# Patient Record
Sex: Female | Born: 1965 | Race: Black or African American | Hispanic: No | Marital: Single | Smoking: Former smoker
Health system: Southern US, Community
[De-identification: ages and names within clinical notes are randomized; demographics above are authoritative.]

## PROBLEM LIST (undated history)

## (undated) DIAGNOSIS — M93001 Unspecified slipped upper femoral epiphysis (nontraumatic), right hip: Secondary | ICD-10-CM

## (undated) DIAGNOSIS — M25559 Pain in unspecified hip: Secondary | ICD-10-CM

## (undated) DIAGNOSIS — K810 Acute cholecystitis: Secondary | ICD-10-CM

## (undated) DIAGNOSIS — H269 Unspecified cataract: Secondary | ICD-10-CM

## (undated) DIAGNOSIS — D649 Anemia, unspecified: Secondary | ICD-10-CM

## (undated) DIAGNOSIS — Z9889 Other specified postprocedural states: Secondary | ICD-10-CM

## (undated) DIAGNOSIS — K5790 Diverticulosis of intestine, part unspecified, without perforation or abscess without bleeding: Secondary | ICD-10-CM

## (undated) DIAGNOSIS — T7840XA Allergy, unspecified, initial encounter: Secondary | ICD-10-CM

## (undated) DIAGNOSIS — R7303 Prediabetes: Secondary | ICD-10-CM

## (undated) DIAGNOSIS — M1711 Unilateral primary osteoarthritis, right knee: Secondary | ICD-10-CM

## (undated) DIAGNOSIS — E7800 Pure hypercholesterolemia, unspecified: Secondary | ICD-10-CM

## (undated) DIAGNOSIS — H9190 Unspecified hearing loss, unspecified ear: Secondary | ICD-10-CM

## (undated) DIAGNOSIS — Z01419 Encounter for gynecological examination (general) (routine) without abnormal findings: Secondary | ICD-10-CM

## (undated) DIAGNOSIS — R32 Unspecified urinary incontinence: Secondary | ICD-10-CM

## (undated) DIAGNOSIS — M2550 Pain in unspecified joint: Secondary | ICD-10-CM

## (undated) DIAGNOSIS — M069 Rheumatoid arthritis, unspecified: Secondary | ICD-10-CM

## (undated) DIAGNOSIS — E119 Type 2 diabetes mellitus without complications: Secondary | ICD-10-CM

## (undated) DIAGNOSIS — I10 Essential (primary) hypertension: Secondary | ICD-10-CM

## (undated) DIAGNOSIS — D219 Benign neoplasm of connective and other soft tissue, unspecified: Secondary | ICD-10-CM

## (undated) HISTORY — DX: Unilateral primary osteoarthritis, right knee: M17.11

## (undated) HISTORY — DX: Type 2 diabetes mellitus without complications: E11.9

## (undated) HISTORY — DX: Unspecified urinary incontinence: R32

## (undated) HISTORY — DX: Encounter for gynecological examination (general) (routine) without abnormal findings: Z01.419

## (undated) HISTORY — DX: Unspecified slipped upper femoral epiphysis (nontraumatic), right hip: M93.001

## (undated) HISTORY — DX: Benign neoplasm of connective and other soft tissue, unspecified: D21.9

## (undated) HISTORY — DX: Unspecified cataract: H26.9

## (undated) HISTORY — DX: Pure hypercholesterolemia, unspecified: E78.00

## (undated) HISTORY — DX: Allergy, unspecified, initial encounter: T78.40XA

## (undated) HISTORY — DX: Rheumatoid arthritis, unspecified: M06.9

## (undated) HISTORY — DX: Unspecified hearing loss, unspecified ear: H91.90

## (undated) HISTORY — DX: Pain in unspecified joint: M25.50

## (undated) HISTORY — DX: Diverticulosis of intestine, part unspecified, without perforation or abscess without bleeding: K57.90

---
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
## 2000-04-17 ENCOUNTER — Emergency Department (HOSPITAL_COMMUNITY): Admission: EM | Admit: 2000-04-17 | Discharge: 2000-04-17 | Payer: Self-pay | Admitting: Emergency Medicine

## 2001-06-20 ENCOUNTER — Emergency Department (HOSPITAL_COMMUNITY): Admission: EM | Admit: 2001-06-20 | Discharge: 2001-06-20 | Payer: Self-pay | Admitting: Emergency Medicine

## 2001-08-22 ENCOUNTER — Emergency Department (HOSPITAL_COMMUNITY): Admission: EM | Admit: 2001-08-22 | Discharge: 2001-08-22 | Payer: Self-pay | Admitting: *Deleted

## 2002-05-21 ENCOUNTER — Emergency Department (HOSPITAL_COMMUNITY): Admission: AD | Admit: 2002-05-21 | Discharge: 2002-05-22 | Payer: Self-pay | Admitting: Emergency Medicine

## 2002-05-21 ENCOUNTER — Encounter: Payer: Self-pay | Admitting: Emergency Medicine

## 2002-05-30 ENCOUNTER — Ambulatory Visit (HOSPITAL_COMMUNITY): Admission: RE | Admit: 2002-05-30 | Discharge: 2002-05-30 | Payer: Self-pay

## 2003-03-27 ENCOUNTER — Emergency Department (HOSPITAL_COMMUNITY): Admission: AD | Admit: 2003-03-27 | Discharge: 2003-03-27 | Payer: Self-pay | Admitting: Family Medicine

## 2003-10-20 ENCOUNTER — Emergency Department (HOSPITAL_COMMUNITY): Admission: EM | Admit: 2003-10-20 | Discharge: 2003-10-20 | Payer: Self-pay | Admitting: Emergency Medicine

## 2004-05-12 ENCOUNTER — Emergency Department (HOSPITAL_COMMUNITY): Admission: EM | Admit: 2004-05-12 | Discharge: 2004-05-12 | Payer: Self-pay

## 2004-06-05 ENCOUNTER — Emergency Department (HOSPITAL_COMMUNITY): Admission: EM | Admit: 2004-06-05 | Discharge: 2004-06-05 | Payer: Self-pay | Admitting: Emergency Medicine

## 2006-04-16 ENCOUNTER — Emergency Department (HOSPITAL_COMMUNITY): Admission: EM | Admit: 2006-04-16 | Discharge: 2006-04-16 | Payer: Self-pay | Admitting: Emergency Medicine

## 2006-06-14 ENCOUNTER — Other Ambulatory Visit: Admission: RE | Admit: 2006-06-14 | Discharge: 2006-06-14 | Payer: Self-pay | Admitting: Family Medicine

## 2006-07-13 ENCOUNTER — Emergency Department (HOSPITAL_COMMUNITY): Admission: EM | Admit: 2006-07-13 | Discharge: 2006-07-13 | Payer: Self-pay | Admitting: Emergency Medicine

## 2008-05-15 ENCOUNTER — Other Ambulatory Visit: Admission: RE | Admit: 2008-05-15 | Discharge: 2008-05-15 | Payer: Self-pay | Admitting: Family Medicine

## 2010-10-25 LAB — I-STAT 8, (EC8 V) (CONVERTED LAB)
Acid-base deficit: 1
BUN: 9
Bicarbonate: 25.3 — ABNORMAL HIGH
HCT: 43
Hemoglobin: 14.6
Operator id: 161631
Sodium: 137
TCO2: 27
pCO2, Ven: 45.7

## 2010-10-25 LAB — POCT CARDIAC MARKERS
CKMB, poc: 1.1
Myoglobin, poc: 60.5
Troponin i, poc: <0.05

## 2011-09-17 ENCOUNTER — Emergency Department (HOSPITAL_COMMUNITY): Payer: Self-pay

## 2011-09-17 ENCOUNTER — Encounter (HOSPITAL_COMMUNITY): Payer: Self-pay | Admitting: *Deleted

## 2011-09-17 ENCOUNTER — Emergency Department (HOSPITAL_COMMUNITY)
Admission: EM | Admit: 2011-09-17 | Discharge: 2011-09-17 | Disposition: A | Discharge status: Home / Self Care | Payer: Self-pay | Attending: Emergency Medicine | Admitting: Emergency Medicine

## 2011-09-17 DIAGNOSIS — D279 Benign neoplasm of unspecified ovary: Secondary | ICD-10-CM | POA: Insufficient documentation

## 2011-09-17 DIAGNOSIS — M25559 Pain in unspecified hip: Secondary | ICD-10-CM | POA: Insufficient documentation

## 2011-09-17 DIAGNOSIS — E7800 Pure hypercholesterolemia, unspecified: Secondary | ICD-10-CM | POA: Insufficient documentation

## 2011-09-17 DIAGNOSIS — R109 Unspecified abdominal pain: Secondary | ICD-10-CM | POA: Insufficient documentation

## 2011-09-17 DIAGNOSIS — I10 Essential (primary) hypertension: Secondary | ICD-10-CM | POA: Insufficient documentation

## 2011-09-17 DIAGNOSIS — F17200 Nicotine dependence, unspecified, uncomplicated: Secondary | ICD-10-CM | POA: Insufficient documentation

## 2011-09-17 HISTORY — DX: Pain in unspecified hip: M25.559

## 2011-09-17 HISTORY — DX: Essential (primary) hypertension: I10

## 2011-09-17 LAB — URINE MICROSCOPIC-ADD ON

## 2011-09-17 LAB — CBC WITH DIFFERENTIAL/PLATELET
Eosinophils Relative: 0 % (ref 0–5)
HCT: 36.3 % (ref 36.0–46.0)
Hemoglobin: 11.9 g/dL — ABNORMAL LOW (ref 12.0–15.0)
Lymphocytes Relative: 26 % (ref 12–46)
MCHC: 32.8 g/dL (ref 30.0–36.0)
MCV: 81.8 fL (ref 78.0–100.0)
Monocytes Absolute: 0.5 10*3/uL (ref 0.1–1.0)
Monocytes Relative: 5 % (ref 3–12)
Neutro Abs: 8.4 10*3/uL — ABNORMAL HIGH (ref 1.7–7.7)

## 2011-09-17 LAB — HEPATIC FUNCTION PANEL
AST: 12 U/L (ref 0–37)
Albumin: 3.5 g/dL (ref 3.5–5.2)
Total Protein: 7.2 g/dL (ref 6.0–8.3)

## 2011-09-17 LAB — POCT I-STAT, CHEM 8
BUN: 9 mg/dL (ref 6–23)
Creatinine, Ser: 0.8 mg/dL (ref 0.50–1.10)
Glucose, Bld: 137 mg/dL — ABNORMAL HIGH (ref 70–99)
Potassium: 3.4 mEq/L — ABNORMAL LOW (ref 3.5–5.1)
Sodium: 143 mEq/L (ref 135–145)

## 2011-09-17 LAB — URINALYSIS, ROUTINE W REFLEX MICROSCOPIC
Glucose, UA: NEGATIVE mg/dL
Hgb urine dipstick: NEGATIVE
Ketones, ur: NEGATIVE mg/dL
Leukocytes, UA: NEGATIVE
Protein, ur: 30 mg/dL — AB
pH: 7 (ref 5.0–8.0)

## 2011-09-17 MED ORDER — IOHEXOL 300 MG/ML  SOLN
20.0000 mL | INTRAMUSCULAR | Status: AC
  Administered 2011-09-17: 20 mL via ORAL

## 2011-09-17 MED ORDER — ONDANSETRON HCL 4 MG PO TABS: 4.0000 mg | ORAL_TABLET | Freq: Four times a day (QID) | ORAL | Status: AC

## 2011-09-17 MED ORDER — FENTANYL CITRATE 0.05 MG/ML IJ SOLN
100.0000 ug | Freq: Once | INTRAMUSCULAR | Status: AC
  Administered 2011-09-17: 100 ug via INTRAVENOUS
  Filled 2011-09-17: qty 2

## 2011-09-17 MED ORDER — HYDROCODONE-ACETAMINOPHEN 5-325 MG PO TABS: 1.0000 | ORAL_TABLET | ORAL | Status: AC | PRN

## 2011-09-17 MED ORDER — IOHEXOL 300 MG/ML  SOLN
100.0000 mL | Freq: Once | INTRAMUSCULAR | Status: AC | PRN
  Administered 2011-09-17: 100 mL via INTRAVENOUS

## 2011-09-17 MED ORDER — HYDROMORPHONE HCL PF 1 MG/ML IJ SOLN
1.0000 mg | Freq: Once | INTRAMUSCULAR | Status: AC
  Administered 2011-09-17: 1 mg via INTRAVENOUS
  Filled 2011-09-17: qty 1

## 2011-09-17 NOTE — ED Notes (Signed)
Patient returned from xray, alert speech is a little slurred from pain medication. States she is feeling better , family at bedside.

## 2011-09-17 NOTE — ED Notes (Addendum)
Pt states that her generalized  abdominal pain across her entire abdomen and pelvis that also radiates to her right side woke her up from sleep 2 hours ago. Pt tearful and moaning in triage unable to sit still.

## 2011-09-17 NOTE — ED Provider Notes (Signed)
46yo G0P0 several hours right lower quadrant localized pain with adnexal tenderness and mass, he now improving but not resolved, pain was gradual onset noncolicky, pain worse with position, she is out of moderately tender right lower quadrant without rebound,GYn paged after [place] resulted.  Medical screening examination/treatment/procedure(s) were conducted as a shared visit with non-physician practitioner(s) and myself.  I personally evaluated the patient during the encounter  D/w Barnabas Lister for f/u 1040  Hurman Horn, MD 09/21/11 1704

## 2011-09-17 NOTE — ED Provider Notes (Signed)
History     CSN: [id number]  Arrival date & time 09/17/11  5784   First MD Initiated Contact with Patient 09/17/11 0344      Chief Complaint  Patient presents with  . Flank Pain    right side  . Abdominal Pain    (Consider location/radiation/quality/duration/timing/severity/associated sxs/prior treatment) Patient is a 46 y.o. female presenting with flank pain and abdominal pain. The history is provided by the patient.  Flank Pain This is a new problem. The current episode started 3 to 5 hours ago. The problem occurs constantly. The problem has not changed since onset.Associated symptoms include abdominal pain. Pertinent negatives include no chest pain, no headaches and no shortness of breath. Nothing aggravates the symptoms. Nothing relieves the symptoms. She has tried nothing for the symptoms. The treatment provided no relief.  Abdominal Pain The primary symptoms of the illness include abdominal pain. The primary symptoms of the illness do not include fever, fatigue, shortness of breath, nausea, vomiting, diarrhea, hematemesis, hematochezia, dysuria, vaginal discharge or vaginal bleeding. The current episode started 1 to 2 hours ago. The onset of the illness was sudden. The problem has not changed since onset. The abdominal pain began 1 to 2 hours ago. The pain came on suddenly. The abdominal pain has been unchanged since its onset. The abdominal pain is generalized. The abdominal pain does not radiate. The severity of the abdominal pain is 10/10. The abdominal pain is relieved by nothing. Exacerbated by: nothing.  The patient states that she believes she is currently not pregnant. The patient has not had a change in bowel habit. Symptoms associated with the illness do not include chills, anorexia, diaphoresis, heartburn, constipation, urgency, hematuria or frequency. Significant associated medical issues do not include GERD.    Past Medical History  Diagnosis Date  . Hypertension   .  High cholesterol   . Hip pain     pt states no cartilage in right hip very painful    History reviewed. No pertinent past surgical history.  History reviewed. No pertinent family history.  History  Substance Use Topics  . Smoking status: Current Everyday Smoker  . Smokeless tobacco: Not on file  . Alcohol Use: Yes    OB History    Grav Para Term Preterm Abortions TAB SAB Ect Mult Living                  Review of Systems  Constitutional: Negative for fever, chills, diaphoresis and fatigue.  Respiratory: Negative for shortness of breath.   Cardiovascular: Negative for chest pain.  Gastrointestinal: Positive for abdominal pain. Negative for heartburn, nausea, vomiting, diarrhea, constipation, hematochezia, anorexia and hematemesis.  Genitourinary: Positive for flank pain. Negative for dysuria, urgency, frequency, hematuria, vaginal bleeding and vaginal discharge.  Neurological: Negative for headaches.  All other systems reviewed and are negative.    Allergies  Review of patient's allergies indicates no known allergies.  Home Medications   Current Outpatient Rx  Name Route Sig Dispense Refill  . OXYCODONE-ACETAMINOPHEN 5-325 MG PO TABS Oral Take 1 tablet by mouth 2 (two) times daily as needed. For pain      BP 116/81  Pulse 54  Temp 97.9 F (36.6 C) (Oral)  Resp 20  SpO2 100%  Physical Exam  Constitutional: She is oriented to person, place, and time. She appears well-developed and well-nourished.  HENT:  Head: Normocephalic and atraumatic.  Mouth/Throat: Oropharynx is clear and moist.  Eyes: Conjunctivae are normal. Pupils are equal, round, and reactive  to light.  Neck: Normal range of motion. Neck supple.  Cardiovascular: Normal rate and regular rhythm.   Pulmonary/Chest: Effort normal and breath sounds normal. She has no wheezes. She has no rales.  Abdominal: Soft. Bowel sounds are normal. There is no rebound and no guarding.    Musculoskeletal: Normal  range of motion.       Arms: Neurological: She is alert and oriented to person, place, and time.  Skin: Skin is warm and dry.    ED Course  Procedures (including critical care time)  Labs Reviewed  URINALYSIS, ROUTINE W REFLEX MICROSCOPIC - Abnormal; Notable for the following:    APPearance CLOUDY (*)     Bilirubin Urine LARGE (*)     Protein, ur 30 (*)     All other components within normal limits  CBC WITH DIFFERENTIAL - Abnormal; Notable for the following:    WBC 12.1 (*)     Hemoglobin 11.9 (*)     Neutro Abs 8.4 (*)     All other components within normal limits  POCT I-STAT, CHEM 8 - Abnormal; Notable for the following:    Potassium 3.4 (*)     Glucose, Bld 137 (*)     Calcium, Ion 1.11 (*)     All other components within normal limits  URINE MICROSCOPIC-ADD ON - Abnormal; Notable for the following:    Squamous Epithelial / LPF MANY (*)     All other components within normal limits  HEPATIC FUNCTION PANEL - Abnormal; Notable for the following:    Total Bilirubin 0.2 (*)     All other components within normal limits  POCT PREGNANCY, URINE  LIPASE, BLOOD   No results found.   No diagnosis found.    MDM  Will sign out to Dr. Fonnie Jarvis and catherine williams, ordered stat pelvic US for r/o torsion        Linnae Rasool K Kijana Estock-Rasch, MD 09/17/11 [phone]

## 2011-09-17 NOTE — ED Provider Notes (Signed)
Care of pt assumed in CDU from Dr. Nicanor Alcon. Patient presented to the emergency department with complaint of abdominal and flank pain which started just prior to arrival last evening. She had no other associated symptoms with this. Pain was described as constant in nature. Patient was initially very uncomfortable on arrival to the emergency department but has had improvement of her symptoms with pain medication. CT of the abdomen and pelvis shows a possible dermoid cyst. The ovary at the area of the cyst is not well visualized. Based on this, the patient was moved to CDU pending a US of the pelvis with flow studies to rule out ovarian torsion. The ultrasound shows color flow at the area of the dermoid cyst although there's not central color flow obviously seen. Discussed findings with Dr. Fonnie Jarvis who examined the patient with me. He discussed the findings with Dr. Su Hilt, the GYN on-call, who states the patient can plan to call the office in the morning for followup within the next few days. Will prescribe pain medicine. Reasons to return for worsening, changing, or new symptoms discussed. Patient verbalized understanding and agreed to this plan.   Grant Fontana, PA-C 09/17/11 1137

## 2011-09-21 ENCOUNTER — Encounter: Payer: Self-pay | Admitting: Obstetrics and Gynecology

## 2011-09-21 ENCOUNTER — Ambulatory Visit (INDEPENDENT_AMBULATORY_CARE_PROVIDER_SITE_OTHER): Payer: 59 | Admitting: Obstetrics and Gynecology

## 2011-09-21 VITALS — BP 130/82 | Temp 98.4°F | Ht 64.0 in | Wt 250.0 lb

## 2011-09-21 DIAGNOSIS — E7800 Pure hypercholesterolemia, unspecified: Secondary | ICD-10-CM | POA: Insufficient documentation

## 2011-09-21 DIAGNOSIS — Z124 Encounter for screening for malignant neoplasm of cervix: Secondary | ICD-10-CM

## 2011-09-21 DIAGNOSIS — N838 Other noninflammatory disorders of ovary, fallopian tube and broad ligament: Secondary | ICD-10-CM

## 2011-09-21 DIAGNOSIS — N839 Noninflammatory disorder of ovary, fallopian tube and broad ligament, unspecified: Secondary | ICD-10-CM

## 2011-09-21 DIAGNOSIS — Z1231 Encounter for screening mammogram for malignant neoplasm of breast: Secondary | ICD-10-CM

## 2011-09-21 DIAGNOSIS — M069 Rheumatoid arthritis, unspecified: Secondary | ICD-10-CM | POA: Insufficient documentation

## 2011-09-21 DIAGNOSIS — Z139 Encounter for screening, unspecified: Secondary | ICD-10-CM

## 2011-09-21 DIAGNOSIS — R350 Frequency of micturition: Secondary | ICD-10-CM

## 2011-09-21 DIAGNOSIS — G8929 Other chronic pain: Secondary | ICD-10-CM | POA: Insufficient documentation

## 2011-09-21 DIAGNOSIS — N920 Excessive and frequent menstruation with regular cycle: Secondary | ICD-10-CM

## 2011-09-21 LAB — CBC
HCT: 36.7 % (ref 36.0–46.0)
Hemoglobin: 11.8 g/dL — ABNORMAL LOW (ref 12.0–15.0)
MCV: 83.6 fL (ref 78.0–100.0)
Platelets: 403 10*3/uL — ABNORMAL HIGH (ref 150–400)
RBC: 4.39 MIL/uL (ref 3.87–5.11)
WBC: 6.3 10*3/uL (ref 4.0–10.5)

## 2011-09-21 LAB — PROLACTIN: Prolactin: 13.1 ng/mL

## 2011-09-21 NOTE — Progress Notes (Signed)
Here to f/u visit to ER secondary to pelvic/abd pain and found to have 14cm ovarian mass c/w dermoid. She also reports very heavy cycles the first two days but skipped menses last month with only spotting Denies h/o surgery No children Hip pain R>L secondary to rheumatoid arthritis.  Needs surgery on that hip she says.  Filed Vitals:   09/21/11 1127  BP: 130/82  Temp: 98.4 F (36.9 C)   ROS: noncontributory  Abdomen:soft, RLQ discomfort, no rebound or guarding Pelvic exam:  VULVA: normal appearing vulva with no masses, tenderness or lesions,  VAGINA: normal appearing vagina with normal color and discharge, no lesions, CERVIX: normal appearing cervix without discharge or lesions,  UTERUS: uterus is normal size, shape, consistency and nontender, difficult secondary to habitus ADNEXA: normal adnexa in size, nontender and no masses.  Pelvic U/S 9/8 - Ut 8x3.9x3.1cm Fundal fibroid 4.4cm, large cystic and solid right adnexal mass 14.3x9.1x14.8cm with echogenic nodule likely dermoid plug, no left adnexal masses, there is flow to the right ovary (this was compared by radiology with CT Scan of same day)  A/P Pap Labs - tsh, prl, cbc, vit d, ca-125 Next available for EmBx Discussed mgmt options and recs.  Pt wants surgical mgmt but will complete w/u for menorrhagia and discuss surgical options/recs. mammo

## 2011-09-22 LAB — PAP IG W/ RFLX HPV ASCU

## 2011-09-23 LAB — URINE CULTURE: Organism ID, Bacteria: NO GROWTH

## 2011-09-27 ENCOUNTER — Telehealth: Payer: Self-pay

## 2011-09-27 NOTE — Telephone Encounter (Signed)
Left message for pt to return call regarding Vit-d protocol. Alyssa Rangel

## 2011-09-29 ENCOUNTER — Telehealth: Payer: Self-pay

## 2011-09-29 DIAGNOSIS — E559 Vitamin D deficiency, unspecified: Secondary | ICD-10-CM

## 2011-09-29 NOTE — Telephone Encounter (Signed)
Pt returned call regarding Vit-d protocol. RX was called in for Vit-d softgels 50,000 units 1 cap 2 x wk for 8 wks # 28 0 rf. Future lab ordered. Mathis Bud

## 2011-10-09 ENCOUNTER — Encounter: Payer: 59 | Admitting: Obstetrics and Gynecology

## 2011-10-09 ENCOUNTER — Encounter: Payer: Self-pay | Admitting: Obstetrics and Gynecology

## 2011-10-17 ENCOUNTER — Encounter: Payer: Self-pay | Admitting: Obstetrics and Gynecology

## 2011-10-17 ENCOUNTER — Ambulatory Visit (INDEPENDENT_AMBULATORY_CARE_PROVIDER_SITE_OTHER): Payer: 59 | Admitting: Obstetrics and Gynecology

## 2011-10-17 VITALS — BP 126/58 | Resp 16 | Ht 64.0 in | Wt 239.0 lb

## 2011-10-17 DIAGNOSIS — N898 Other specified noninflammatory disorders of vagina: Secondary | ICD-10-CM

## 2011-10-17 DIAGNOSIS — N920 Excessive and frequent menstruation with regular cycle: Secondary | ICD-10-CM

## 2011-10-17 DIAGNOSIS — R110 Nausea: Secondary | ICD-10-CM

## 2011-10-17 DIAGNOSIS — N949 Unspecified condition associated with female genital organs and menstrual cycle: Secondary | ICD-10-CM

## 2011-10-17 LAB — POCT WET PREP (WET MOUNT)
Clue Cells Wet Prep Whiff POC: POSITIVE
Trichomonas Wet Prep HPF POC: NEGATIVE
pH: 5

## 2011-10-17 LAB — POCT URINE PREGNANCY: Preg Test, Ur: NEGATIVE

## 2011-10-17 MED ORDER — PROMETHAZINE HCL 25 MG/ML IJ SOLN
25.0000 mg | Freq: Four times a day (QID) | INTRAMUSCULAR | Status: AC | PRN
  Administered 2011-10-17: 25 mg via INTRAMUSCULAR

## 2011-10-17 MED ORDER — TINIDAZOLE 500 MG PO TABS: 2.0000 g | ORAL_TABLET | Freq: Every day | ORAL | Status: DC

## 2011-10-17 NOTE — Progress Notes (Signed)
Here for EmBx  Filed Vitals:   10/17/11 1155  BP: 126/58  Resp: 16   ROS: noncontributory  Pelvic exam:  VULVA: normal appearing vulva with no masses, tenderness or lesions,  VAGINA: normal appearing vagina with normal color and discharge, no lesions, CERVIX: normal appearing cervix without discharge or lesions,   EmBx performed per protocol Pt very uncomfortable with first pass so no more passes attempted Pipelle passed to 12-14cm  Results for orders placed in visit on 10/17/11  POCT URINE PREGNANCY      Component Value Range   Preg Test, Ur Negative    POCT WET PREP (WET MOUNT)      Component Value Range   Source Wet Prep POC neg     WBC, Wet Prep HPF POC neg     Bacteria Wet Prep HPF POC mod     BACTERIA WET PREP MORPHOLOGY POC       Clue Cells Wet Prep HPF POC Moderate     CLUE CELLS WET PREP WHIFF POC Positive Whiff     Yeast Wet Prep HPF POC None     KOH Wet Prep POC       Trichomonas Wet Prep HPF POC neg     pH 5.0     Labs reviewed vit d low and mildly anemic, tsh and prl wnl and ca-125 wnl  A/P Phenergan now secondary to nausea Rx for tindamax sent to pharm secondary to BV RTO 1-2wks to discuss surgery  Lap to remove cyst/?ovary and ablation vs hysterectomy for bleeding

## 2011-10-17 NOTE — Addendum Note (Signed)
Addended by: Marla Roe A on: 10/17/2011 01:20 PM   Modules accepted: Orders

## 2011-10-19 LAB — PATHOLOGY

## 2011-10-25 ENCOUNTER — Ambulatory Visit: Payer: Self-pay

## 2011-10-30 ENCOUNTER — Encounter: Payer: Self-pay | Admitting: Obstetrics and Gynecology

## 2011-10-30 ENCOUNTER — Ambulatory Visit (INDEPENDENT_AMBULATORY_CARE_PROVIDER_SITE_OTHER): Payer: 59 | Admitting: Obstetrics and Gynecology

## 2011-10-30 VITALS — BP 134/64 | Ht 64.0 in | Wt 240.0 lb

## 2011-10-30 DIAGNOSIS — N839 Noninflammatory disorder of ovary, fallopian tube and broad ligament, unspecified: Secondary | ICD-10-CM

## 2011-10-30 DIAGNOSIS — N920 Excessive and frequent menstruation with regular cycle: Secondary | ICD-10-CM | POA: Insufficient documentation

## 2011-10-30 DIAGNOSIS — N838 Other noninflammatory disorders of ovary, fallopian tube and broad ligament: Secondary | ICD-10-CM

## 2011-10-30 NOTE — Progress Notes (Signed)
Here to f/u em bx  Office Visit on 10/17/2011  Component Date Value Range Status  . Report 10/17/2011    Final   Comment: FINAL DIAGNOSIS:                          Endometrial biopsy:                            - Benign endometrium                           COMMENT:                          The specimen is scant and may in fact represent lower endometrial canal sampling.  The                          adequacy of the specimen will depend upon correlation with the thickness of the                          endometrium as assessed by ultrasound.                            Army Melia, M.D.                          Electronically Signed                          CLINICAL HISTORY:                          626.2                          SOURCE OF SPECIMEN:                          A: Endometrium - Biopsy                          GROSS DESCRIPTION:                          Received in a single container of formalin labeled with the patient's name and "EMBX" is                          cloudy mucoid material admixed with small fragments of pale tan soft tissue measuring 1.0                          x 0.8 x 0.3 cm in aggregate dimensions. The specimen is filtered and entirely submitted in                          cassette A.  CA/srt                           MICROSCOPIC DESCRIPTION:  The histologic section shows a strip of inactive endometrium.  REB/adw                          Signout Location: [facility] [place], [place], [place] (CPT: [phone])  . Preg Test, Ur 10/17/2011 Negative   Final  . Source Wet Prep POC 10/17/2011 neg   Final  . WBC, Wet Prep HPF POC 10/17/2011 neg   Final  . Bacteria Wet Prep HPF POC 10/17/2011 mod   Final  . Clue Cells Wet Prep HPF POC 10/17/2011 Moderate   Final  . CLUE CELLS WET PREP WHIFF POC 10/17/2011 Positive Whiff   Final  . Yeast Wet Prep HPF POC 10/17/2011 None   Final  . Trichomonas Wet Prep HPF POC 10/17/2011 neg   Final    . pH 10/17/2011 5.0   Final   Filed Vitals:   10/30/11 1513  BP: 134/64   A/P Reviewed em bx Options and recs discussed Pt agreeable to proceed with surgical mgmt Will schedule laparoscopic rt cystectomy, poss laparotomy, poss oophorectomy, hysteroscopy, d&C and ablation

## 2011-11-21 ENCOUNTER — Telehealth: Payer: Self-pay | Admitting: Obstetrics and Gynecology

## 2011-11-21 NOTE — Telephone Encounter (Signed)
Laparoscopic Cystectomy; Possible Laparotomy, possible Oopherectomy; Hysteroscopy D&C; Ablation scheduled for 12/29/11 @ 10:00 with AR/ND. [employer] effective 10/10/11.  Plan pays 75/25 after a $1,500 deductible. -Alyssa Rangel

## 2011-11-22 ENCOUNTER — Other Ambulatory Visit: Payer: Self-pay | Admitting: Obstetrics and Gynecology

## 2011-11-23 ENCOUNTER — Telehealth: Payer: Self-pay | Admitting: Obstetrics and Gynecology

## 2011-11-28 ENCOUNTER — Emergency Department (HOSPITAL_COMMUNITY)
Admission: EM | Admit: 2011-11-28 | Discharge: 2011-11-28 | Disposition: A | Discharge status: Home / Self Care | Payer: Worker's Compensation | Attending: Emergency Medicine | Admitting: Emergency Medicine

## 2011-11-28 ENCOUNTER — Emergency Department (HOSPITAL_COMMUNITY): Payer: Worker's Compensation

## 2011-11-28 DIAGNOSIS — S7000XA Contusion of unspecified hip, initial encounter: Secondary | ICD-10-CM

## 2011-11-28 DIAGNOSIS — F17200 Nicotine dependence, unspecified, uncomplicated: Secondary | ICD-10-CM | POA: Insufficient documentation

## 2011-11-28 DIAGNOSIS — Y929 Unspecified place or not applicable: Secondary | ICD-10-CM | POA: Insufficient documentation

## 2011-11-28 DIAGNOSIS — S40019A Contusion of unspecified shoulder, initial encounter: Secondary | ICD-10-CM

## 2011-11-28 DIAGNOSIS — Z79899 Other long term (current) drug therapy: Secondary | ICD-10-CM | POA: Insufficient documentation

## 2011-11-28 DIAGNOSIS — M1610 Unilateral primary osteoarthritis, unspecified hip: Secondary | ICD-10-CM | POA: Insufficient documentation

## 2011-11-28 DIAGNOSIS — Y9389 Activity, other specified: Secondary | ICD-10-CM | POA: Insufficient documentation

## 2011-11-28 DIAGNOSIS — I10 Essential (primary) hypertension: Secondary | ICD-10-CM | POA: Insufficient documentation

## 2011-11-28 DIAGNOSIS — IMO0002 Reserved for concepts with insufficient information to code with codable children: Secondary | ICD-10-CM | POA: Insufficient documentation

## 2011-11-28 DIAGNOSIS — M069 Rheumatoid arthritis, unspecified: Secondary | ICD-10-CM | POA: Insufficient documentation

## 2011-11-28 DIAGNOSIS — E7800 Pure hypercholesterolemia, unspecified: Secondary | ICD-10-CM | POA: Insufficient documentation

## 2011-11-28 DIAGNOSIS — M129 Arthropathy, unspecified: Secondary | ICD-10-CM | POA: Insufficient documentation

## 2011-11-28 DIAGNOSIS — M169 Osteoarthritis of hip, unspecified: Secondary | ICD-10-CM | POA: Insufficient documentation

## 2011-11-28 DIAGNOSIS — IMO0001 Reserved for inherently not codable concepts without codable children: Secondary | ICD-10-CM | POA: Insufficient documentation

## 2011-11-28 LAB — RAPID URINE DRUG SCREEN, HOSP PERFORMED
Amphetamines: NOT DETECTED
Benzodiazepines: NOT DETECTED
Cocaine: POSITIVE — AB
Opiates: NOT DETECTED

## 2011-11-28 MED ORDER — IBUPROFEN 200 MG PO TABS
600.0000 mg | ORAL_TABLET | Freq: Once | ORAL | Status: AC
  Administered 2011-11-28: 600 mg via ORAL
  Filled 2011-11-28: qty 1

## 2011-11-28 MED ORDER — NAPROXEN 375 MG PO TABS: 375.0000 mg | ORAL_TABLET | Freq: Two times a day (BID) | ORAL | Status: DC

## 2011-11-28 MED ORDER — OXYCODONE-ACETAMINOPHEN 5-325 MG PO TABS
1.0000 | ORAL_TABLET | Freq: Once | ORAL | Status: AC
  Administered 2011-11-28: 1 via ORAL
  Filled 2011-11-28: qty 1

## 2011-11-28 MED ORDER — OXYCODONE-ACETAMINOPHEN 5-325 MG PO TABS: 1.0000 | ORAL_TABLET | ORAL | Status: DC | PRN

## 2011-11-28 NOTE — ED Notes (Signed)
ZOX:[id number]<AV> Expected date:<BR> Expected time:<BR> Means of arrival:<BR> Comments:<BR> LSB-fall

## 2011-11-28 NOTE — ED Notes (Signed)
Pt fell going into work today.  Lost her balance and fell on left side.  Also hit right side of head, c/o right shoulder pain and right hip pain.  Pt has outward rotation and shortening of right lower extremity.  Pt was given 150mg  Fentanyl by EMS in route.  Pt denies LOC and is alert and oriented although a bit sleepy now due to pain medication.

## 2011-11-28 NOTE — ED Provider Notes (Signed)
History     CSN: [id number]  Arrival date & time 11/28/11  1547   First MD Initiated Contact with Patient 11/28/11 1625      Chief Complaint  Patient presents with  . Fall    (Consider location/radiation/quality/duration/timing/severity/associated sxs/prior treatment) HPI Pt states she mis-stepped on a curb lost her balance and fell back ward onto R sided. No head or neck trauma. No LOC. Pt c/o of muscular pain to shoulder and R thigh. Pt has chronic R hip pain and states pain is mildly worse than baseline. C-collar places by EMS. No numbness, or weakness.  Past Medical History  Diagnosis Date  . Hypertension   . High cholesterol   . Hip pain     pt states no cartilage in right hip very painful  . Hypercholesterolemia   . Rheumatoid arthritis     has caused deterioration of cartiledge in R hip.    No past surgical history on file.  Family History  Problem Relation Age of Onset  . Heart disease Maternal Grandmother   . Diabetes Maternal Grandmother   . Hypertension Maternal Grandmother   . Diabetes Mother   . Hypertension Mother     History  Substance Use Topics  . Smoking status: Current Every Day Smoker  . Smokeless tobacco: Not on file  . Alcohol Use: Yes    OB History    Grav Para Term Preterm Abortions TAB SAB Ect Mult Living                  Review of Systems  Constitutional: Negative for fever, chills and fatigue.  HENT: Negative for neck pain and neck stiffness.   Eyes: Negative for visual disturbance.  Respiratory: Negative for shortness of breath.   Cardiovascular: Negative for chest pain, palpitations and leg swelling.  Gastrointestinal: Negative for nausea, vomiting and abdominal pain.  Genitourinary: Negative for dysuria.  Musculoskeletal: Positive for myalgias and arthralgias. Negative for back pain.  Skin: Negative for rash and wound.  Neurological: Negative for dizziness, seizures, syncope, weakness, light-headedness, numbness and  headaches.    Allergies  Review of patient's allergies indicates no known allergies.  Home Medications   Current Outpatient Rx  Name  Route  Sig  Dispense  Refill  . FERROUS SULFATE 325 (65 FE) MG PO TABS   Oral   Take 325 mg by mouth 2 (two) times a week. Tuesdays and Thursdays.         Marland Kitchen NAPROXEN 375 MG PO TABS   Oral   Take 1 tablet (375 mg total) by mouth 2 (two) times daily.   20 tablet   0   . OXYCODONE-ACETAMINOPHEN 5-325 MG PO TABS   Oral   Take 1 tablet by mouth every 4 (four) hours as needed. For pain   15 tablet   0     BP 140/67  Pulse 57  Temp 98.1 F (36.7 C) (Oral)  Resp 18  SpO2 98%  LMP 11/23/2011  Physical Exam  Nursing note and vitals reviewed. Constitutional: She is oriented to person, place, and time. She appears well-developed and well-nourished. No distress.  HENT:  Head: Normocephalic and atraumatic.  Mouth/Throat: Oropharynx is clear and moist.  Eyes: EOM are normal. Pupils are equal, round, and reactive to light.  Neck: Normal range of motion. Neck supple.       No posterior cervical midline TTP. + tenderness to R trapezius.   Cardiovascular: Normal rate and regular rhythm.  Exam reveals no gallop  and no friction rub.   No murmur heard. Pulmonary/Chest: Effort normal and breath sounds normal. No respiratory distress. She has no wheezes. She has no rales.  Abdominal: Soft. Bowel sounds are normal. She exhibits no mass. There is no tenderness. There is no rebound and no guarding.  Musculoskeletal: Normal range of motion. She exhibits tenderness (TTP over R lateral thigh. No deformity or injury. FROM of R hip. 2+ DP. No shortening or rotation of RLE. ). She exhibits no edema.  Neurological: She is alert and oriented to person, place, and time.       5/5 motor in all ext. Sensation intact  Skin: Skin is warm and dry. No rash noted. No erythema.  Psychiatric: She has a normal mood and affect. Her behavior is normal.    ED Course    Procedures (including critical care time)  Labs Reviewed  URINE RAPID DRUG SCREEN (HOSP PERFORMED) - Abnormal; Notable for the following:    Cocaine POSITIVE (*)     All other components within normal limits   Dg Hip Complete Right  11/28/2011  *RADIOLOGY REPORT*  Clinical Data: Status post fall.  Right hip pain.  RIGHT HIP - COMPLETE 2+ VIEW  Comparison: Plain films 04/16/2006.  Findings: The patient has a remote right slipped capital femoral epiphyses.  The right hip is located.  There has been progression of severe right hip osteoarthritis.  No fracture is identified.  IMPRESSION:  1.  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 2.  Progression of severe right hip osteoarthritis.   Original Report Authenticated By: Holley Dexter, M.D.      1. Shoulder contusion   2. Contusion, hip   3. Osteoarthritis of hip       MDM          Loren Racer, MD 11/28/11 2315

## 2011-11-29 ENCOUNTER — Telehealth (HOSPITAL_COMMUNITY): Payer: Self-pay | Admitting: *Deleted

## 2011-12-14 ENCOUNTER — Telehealth: Payer: Self-pay | Admitting: Obstetrics and Gynecology

## 2011-12-14 NOTE — Telephone Encounter (Signed)
Laparoscopic Cystectomy; Possible Laparotomy, possible Oopherectomy; Hysteroscopy D&C; Ablation rescheduled to 02/06/12 @ 9:30 with AR/ND. [employer] effective 10/10/11.  Plan pays 75/25 after a $1,500 deductible. -Adrianne Pridgen

## 2011-12-22 ENCOUNTER — Encounter: Payer: 59 | Admitting: Obstetrics and Gynecology

## 2011-12-28 ENCOUNTER — Telehealth: Payer: Self-pay | Admitting: Obstetrics and Gynecology

## 2012-01-01 ENCOUNTER — Other Ambulatory Visit: Payer: Self-pay | Admitting: Obstetrics and Gynecology

## 2012-01-15 ENCOUNTER — Encounter: Payer: 59 | Admitting: Obstetrics and Gynecology

## 2012-01-25 ENCOUNTER — Telehealth: Payer: Self-pay | Admitting: Obstetrics and Gynecology

## 2012-01-25 NOTE — Telephone Encounter (Signed)
TC to [facility] for benefits, Precert is not required for 29562 and [id number]. -Adrianne Pridgen

## 2012-01-25 NOTE — Telephone Encounter (Signed)
Laparoscopic Cystectomy; Possible Laparotomy possible Oopherectomy; Hysteroscopy D&C and Ablation scheduled for 02/06/12 @ 9:30 with AR/ND.  [employer] effective 10/10/11. Plan pays 75/25 after a $1,500 deductible. Pre-cert not Required. -Adrianne Pridgen

## 2012-01-26 ENCOUNTER — Encounter: Payer: Self-pay | Admitting: Obstetrics and Gynecology

## 2012-01-26 ENCOUNTER — Ambulatory Visit: Payer: 59 | Admitting: Obstetrics and Gynecology

## 2012-01-26 VITALS — BP 128/62 | HR 70 | Temp 97.9°F | Resp 16 | Ht 64.0 in | Wt 233.0 lb

## 2012-01-26 DIAGNOSIS — D279 Benign neoplasm of unspecified ovary: Secondary | ICD-10-CM

## 2012-01-26 DIAGNOSIS — R110 Nausea: Secondary | ICD-10-CM

## 2012-01-26 DIAGNOSIS — Z01818 Encounter for other preprocedural examination: Secondary | ICD-10-CM

## 2012-01-26 DIAGNOSIS — R102 Pelvic and perineal pain: Secondary | ICD-10-CM

## 2012-01-26 DIAGNOSIS — N920 Excessive and frequent menstruation with regular cycle: Secondary | ICD-10-CM

## 2012-01-26 MED ORDER — ONDANSETRON HCL 4 MG PO TABS: 4.0000 mg | ORAL_TABLET | Freq: Three times a day (TID) | ORAL | Status: DC | PRN

## 2012-01-26 MED ORDER — OXYCODONE-ACETAMINOPHEN 5-325 MG PO TABS: 1.0000 | ORAL_TABLET | ORAL | Status: DC | PRN

## 2012-01-26 NOTE — H&P (Deleted)
Alyssa Rangel is a 47 y.o. female No obstetric history on file. who presents for a right ovarian cystectomy and endometrial ablation because of a large dermoid cyst and menorrhagia.  In September 2013, patient went to the emergency department because of severe pelvic pain.  She was found on CT and pelvic ultrasound scans to have a retroverted uterus-8.0 x 3.9 x 3.1 cm with a 3.7 x 3.9 x 4.4 cm fundal fibroid and a large right complex cystic/solid right adnexal mass measuring 14.3 x 9.1 x 14.8 cm. the mass contained within an echogenic nodule within the cystic component that reflects a dermoid plug; left ovary was not visualized.  These findings were consistent with a right dermoid cyst. Additionally the patient reports a menstrual flow that lasts for 5 days with the change of two maxi pads five times a day. In addition she has cramping rated as a 10/10 on a 10 point pain scale that is mildly relieved with Aleve.  Lately she has been skipping periods and denies any intermenstrual bleeding.  Since the development of the dermoid cyst she has experienced 7/10 pain most days of the week with a decrease to 5/10 on a 10 point pain scale with #2 Aleve. Pain is described as a shooting pain in the vagina as well as a dull ache at other times.  She has been having increasing nausea with some vomiting but no fever, diarrhea, constipation  or dysuria. She admits to urinary frequency, urgency and occasional incontinence,  but states  is not a new symptom.    Past Medical History  OB History: No obstetric history on file.   GYN History: menarche: 47 YO;    LMP: 12/23/2011    Contracepton abstinence  The patient denies history of sexually transmitted disease.  Denies history of abnormal PAP smear  Last PAP smear 2013  Medical History: Rheumatoid arthritis, hypertension, hip pain, hyprercholesterolemia  Surgical History: Negative Denies problems with anesthesia or history of blood transfusions  Family  History: Diabetes, hypertension and cardiovascular disease  Social History:   Single and employed with [employer]: Denies tobacco or illicit drug use but admits to consuming alcohol socially   Outpatient Encounter Prescriptions as of 01/26/2012 Medication Sig Dispense Refill . ferrous sulfate 325 (65 FE) MG tablet Take 325 mg by mouth 2 (two) times a week. Tuesdays and Thursdays.     . naproxen (NAPROSYN) 375 MG tablet Take 1 tablet (375 mg total) by mouth 2 (two) times daily.  20 tablet  0 . oxyCODONE-acetaminophen (PERCOCET/ROXICET) 5-325 MG per tablet Take 1 tablet by mouth every 4 (four) hours as needed. For pain  15 tablet  0  Denies sensitivity to peanuts, shellfish, soy, latex or adhesives.   No Known Allergies  ROS: Admits to nausea, occasional vomiting, urinary urgency, frequency and occasional incontinence;  Denies headache, vision changes, nasal congestion, dysphagia, tinnitus, dizziness, hoarseness, cough,  chest pain, shortness of breath,  diarrhea,constipation, dysuria, hematuria, vaginitis symptoms, swelling of joints,easy bruising,  myalgias, arthralgias, skin rashes, unexplained weight loss and except as is mentioned in the history of present illness, patient's review of systems is otherwise negative.  Physical Exam  BP 128/62  Pulse 70  Temp 97.9 F (36.6 C) (Oral)  Resp 16  Ht 5\' 4"  (1.626 m)  Wt 233 lb (105.688 kg)  BMI 39.99 kg/m2  LMP 12/23/2011  Neck: supple without masses or thyromegaly Lungs: clear to auscultation Heart: regular rate and rhythm Abdomen: tender diffusely, RLQ > elsewhere with the  ability to outline a large mass right of midline-below umbilicus Pelvic:EGBUS- wnl; vagina-normal rugae; uterus-difficulty to assess due to patient discomfort and pelvic fullness, cervix without lesions or motion tenderness; adnexae-right side tender; left with mild tenderness Extremities:  no clubbing, cyanosis or edema  Assesment:  Right Dermoid Cyst                        Menorrhagia   Disposition:  A discussion was held with patient regarding the indication for her procedure(s) along with the risks, which include but are not limited to: reaction to anesthesia, damage to adjacent organs, infection, excessive bleeding, possible need for an open abdominal incision and possible oophorectomy.  The patient verbalized understanding of these risks and has consented to proceed with an Operative Laparoscopy with a possible Laparotomy, Laparoscopic Cystectomy with possible Oophorectomy, Hysteroscopy with Dilatation and Curettage and Endometrial Ablation at [facility] on February 06, 2012 at 9:30 a.m.   CSN# [id number]   Leston Schueller J. Lowell Guitar, PA-C  for Dr. Woodroe Mode. Su Hilt

## 2012-01-26 NOTE — Progress Notes (Signed)
Alyssa Rangel is a 47 y.o. female No obstetric history on file. who presents for a right ovarian cystectomy and endometrial ablation because of a large dermoid cyst and menorrhagia.  In September 2013, patient went to the emergency department because of severe pelvic pain.  She was found on CT and pelvic ultrasound scans to have a retroverted uterus-8.0 x 3.9 x 3.1 cm with a 3.7 x 3.9 x 4.4 cm fundal fibroid and a large right complex cystic/solid right adnexal mass measuring 14.3 x 9.1 x 14.8 cm. the mass contained within an echogenic nodule within the cystic component that reflects a dermoid plug; left ovary was not visualized.  These findings were consistent with a right dermoid cyst. Additionally the patient reports a menstrual flow that lasts for 5 days with the change of two maxi pads five times a day. In addition she has cramping rated as a 10/10 on a 10 point pain scale that is mildly relieved with Aleve.  Lately she has been skipping periods and denies any intermenstrual bleeding.  Since the development of the dermoid cyst she has experienced 7/10 pain most days of the week with a decrease to 5/10 on a 10 point pain scale with #2 Aleve. Pain is described as a shooting pain in the vagina as well as a dull ache at other times.  She has been having increasing nausea with some vomiting but no fever, diarrhea, constipation  or dysuria. She admits to urinary frequency, urgency and occasional incontinence,  but states  is not a new symptom. Given her radiographic findings along with symptoms of heavy bleeding the patient has decided to forgo any medical management and proceed with surgery in the form or cystectomy and endometrial ablation   Past Medical History  OB History: No obstetric history on file.   GYN History: menarche: 47 YO;    LMP: 12/23/2011    Contracepton abstinence  The patient denies history of sexually transmitted disease.  Denies history of abnormal PAP smear  Last PAP smear  2013  Medical History: Rheumatoid arthritis, hypertension, hip pain, hyprercholesterolemia  Surgical History: Negative Denies problems with anesthesia or history of blood transfusions  Family History: Diabetes, hypertension and cardiovascular disease  Social History:   Single and employed with [employer]: Denies tobacco or illicit drug use but admits to consuming alcohol socially   Outpatient Encounter Prescriptions as of 01/26/2012  Medication Sig Dispense Refill  . ferrous sulfate 325 (65 FE) MG tablet Take 325 mg by mouth 2 (two) times a week. Tuesdays and Thursdays.      . naproxen (NAPROSYN) 375 MG tablet Take 1 tablet (375 mg total) by mouth 2 (two) times daily.  20 tablet  0  . oxyCODONE-acetaminophen (PERCOCET/ROXICET) 5-325 MG per tablet Take 1 tablet by mouth every 4 (four) hours as needed. For pain  15 tablet  0   Denies sensitivity to peanuts, shellfish, soy, latex or adhesives.   No Known Allergies  ROS: Admits to nausea, occasional vomiting, urinary urgency, frequency and occasional incontinence;  Denies headache, vision changes, nasal congestion, dysphagia, tinnitus, dizziness, hoarseness, cough,  chest pain, shortness of breath,  diarrhea,constipation, dysuria, hematuria, vaginitis symptoms, swelling of joints,easy bruising,  myalgias, arthralgias, skin rashes, unexplained weight loss and except as is mentioned in the history of present illness, patient's review of systems is otherwise negative.  Physical Exam  BP 128/62  Pulse 70  Temp 97.9 F (36.6 C) (Oral)  Resp 16  Ht 5\' 4"  (1.626 m)  Wt  233 lb (105.688 kg)  BMI 39.99 kg/m2  LMP 12/23/2011  Neck: supple without masses or thyromegaly Lungs: clear to auscultation Heart: regular rate and rhythm Abdomen: tender diffusely, RLQ > elsewhere with the ability to outline a large mass right of midline-below umbilicus Pelvic:EGBUS- wnl; vagina-normal rugae; uterus-difficulty to assess due to patient discomfort and  pelvic fullness, cervix without lesions or motion tenderness; adnexae-right side tender; left with mild tenderness Extremities:  no clubbing, cyanosis or edema  Assesment:  Right Dermoid Cyst                       Menorrhagia   Disposition:  A discussion was held with patient regarding the indication for her procedure(s) along with the risks, which include but are not limited to: reaction to anesthesia, damage to adjacent organs, infection, excessive bleeding, possible need for an open abdominal incision and possible oophorectomy.  The patient verbalized understanding of these risks and has consented to proceed with an Operative Laparoscopy with a possible Laparotomy, Laparoscopic Cystectomy with possible Oophorectomy, Hysteroscopy with Dilatation and Curettage and Endometrial Ablation at [facility] of [place] on February 06, 2012 at 9:30 a.m.   CSN# [id number]   Christiana Gurevich J. Lowell Guitar, PA-C  for Dr. Woodroe Mode. Su Hilt

## 2012-01-26 NOTE — H&P (Signed)
Alyssa Rangel is a 47 y.o. female G 0. who presents for a right ovarian cystectomy and endometrial ablation because of a large dermoid cyst and menorrhagia.  In September 2013, patient went to the emergency department because of severe pelvic pain.  She was found on CT and pelvic ultrasound scans to have a retroverted uterus-8.0 x 3.9 x 3.1 cm with a 3.7 x 3.9 x 4.4 cm fundal fibroid and a large right complex cystic/solid right adnexal mass measuring 14.3 x 9.1 x 14.8 cm. the mass contained within,  an echogenic nodule within the cystic component that reflects a dermoid plug; left ovary was not visualized.  These findings were consistent with a right dermoid cyst.  Additionally the patient reports a menstrual flow that lasts for 5 days with the change of two maxi pads five times a day. In addition she has cramping rated as a 10/10 on a 10 point pain scale that is mildly relieved with Aleve.  Lately she has been skipping periods and denies any intermenstrual bleeding.  Since the development of the dermoid cyst she has experienced 7/10 pain most days of the week with a decrease to 5/10 on a 10 point pain scale with #2 Aleve. Pain is described as a shooting pain in the vagina as well as a dull ache at other times.  She has been having increasing nausea with some vomiting but no fever, diarrhea, constipation  or dysuria. She admits to urinary frequency, urgency and occasional incontinence,  but states  is not a new symptom. Given her radiographic findings along with symptoms of heavy bleeding the patient has decided to forgo any medical management and proceed with surgery in the form of ovarian cystectomy and endometrial ablation.   Past Medical History  OB History: No obstetric history on file.   GYN History: menarche: 47 YO;    LMP: 12/23/2011    Contracepton abstinence  The patient denies history of sexually transmitted disease.  Denies history of abnormal PAP smear  Last PAP smear 2013  Medical  History: Rheumatoid arthritis, hypertension, hip pain, hyprercholesterolemia  Surgical History: Negative Denies problems with anesthesia or history of blood transfusions  Family History: Diabetes, hypertension and cardiovascular disease  Social History:   Single and employed with [employer]: Denies tobacco or illicit drug use but admits to consuming alcohol socially   Outpatient Encounter Prescriptions as of 01/26/2012  Medication Sig Dispense Refill  . ferrous sulfate 325 (65 FE) MG tablet Take 325 mg by mouth 2 (two) times a week. Tuesdays and Thursdays.      . naproxen (NAPROSYN) 375 MG tablet Take 1 tablet (375 mg total) by mouth 2 (two) times daily.  20 tablet  0  . oxyCODONE-acetaminophen (PERCOCET/ROXICET) 5-325 MG per tablet Take 1 tablet by mouth every 4 (four) hours as needed. For pain  15 tablet  0   Denies sensitivity to peanuts, shellfish, soy, latex or adhesives.   No Known Allergies  ROS: Admits to nausea, occasional vomiting, urinary urgency, frequency and occasional incontinence;  Denies headache, vision changes, nasal congestion, dysphagia, tinnitus, dizziness, hoarseness, cough,  chest pain, shortness of breath,  diarrhea,constipation, dysuria, hematuria, vaginitis symptoms, swelling of joints,easy bruising,  myalgias, arthralgias, skin rashes, unexplained weight loss and except as is mentioned in the history of present illness, patient's review of systems is otherwise negative.  Physical Exam  BP 128/62  Pulse 70  Temp 97.9 F (36.6 C) (Oral)  Resp 16  Ht 5\' 4"  (1.626 m)  Wt  233 lb (105.688 kg)  BMI 39.99 kg/m2  LMP 12/23/2011  Neck: supple without masses or thyromegaly Lungs: clear to auscultation Heart: regular rate and rhythm Abdomen: tender diffusely, RLQ > elsewhere with the ability to outline a large mass right of midline-below umbilicus Pelvic:EGBUS- wnl; vagina-normal rugae; uterus-difficulty to assess due to patient discomfort and pelvic fullness,  cervix without lesions or motion tenderness; adnexae-right side tender; left with mild tenderness Extremities:  no clubbing, cyanosis or edema  Assesment:  Right Dermoid Cyst                       Menorrhagia   Disposition:  A discussion was held with patient regarding the indication for her procedure(s) along with the risks, which include but are not limited to: reaction to anesthesia, damage to adjacent organs, infection, excessive bleeding, possible need for an open abdominal incision and possible oophorectomy.  The patient verbalized understanding of these risks and has consented to proceed with an Operative Laparoscopy with a possible Laparotomy, Laparoscopic Cystectomy with possible Oophorectomy, Hysteroscopy with Dilatation and Curettage and Endometrial Ablation at [facility] of [place] on February 06, 2012 at 9:30 a.m.   CSN# [id number]   Rani Sisney J. Lowell Guitar, PA-C  for Dr. Woodroe Mode. Su Hilt

## 2012-02-05 ENCOUNTER — Encounter (HOSPITAL_COMMUNITY)
Admission: RE | Admit: 2012-02-05 | Discharge: 2012-02-05 | Disposition: A | Discharge status: Home / Self Care | Payer: 59 | Source: Ambulatory Visit | Attending: Obstetrics and Gynecology | Admitting: Obstetrics and Gynecology

## 2012-02-05 ENCOUNTER — Encounter (HOSPITAL_COMMUNITY): Payer: Self-pay

## 2012-02-05 LAB — CBC
HCT: 37.2 % (ref 36.0–46.0)
MCH: 27.2 pg (ref 26.0–34.0)
MCHC: 32 g/dL (ref 30.0–36.0)
MCV: 84.9 fL (ref 78.0–100.0)
Platelets: 396 10*3/uL (ref 150–400)
RDW: 13.6 % (ref 11.5–15.5)
WBC: 8.7 10*3/uL (ref 4.0–10.5)

## 2012-02-05 NOTE — Patient Instructions (Addendum)
18 Alyssa Rangel  02/05/2012   Your procedure is scheduled on:  02/06/12  Enter through the Main Entrance of [facility] at 8 AM.  Pick up the phone at the desk and dial [phone].   Call this number if you have problems the morning of surgery: [phone]   Remember:   Do not eat food:After Midnight.  Do not drink clear liquids: After Midnight.  Take these medicines the morning of surgery with A SIP OF WATER: NA   Do not wear jewelry, make-up or nail polish.  Do not wear lotions, powders, or perfumes. You may wear deodorant.  Do not shave 48 hours prior to surgery.  Do not bring valuables to the hospital.  Contacts, dentures or bridgework may not be worn into surgery.  Leave suitcase in the car. After surgery it may be brought to your room.  For patients admitted to the hospital, checkout time is 11:00 AM the day of discharge.   Patients discharged the day of surgery will not be allowed to drive home.  Name and phone number of your driver: Mother or uncle   [name] or [name]  Special Instructions: Shower using CHG 2 nights before surgery and the night before surgery.  If you shower the day of surgery use CHG.  Use special wash - you have one bottle of CHG for all showers.  You should use approximately 1/3 of the bottle for each shower.   Please read over the following fact sheets that you were given: MRSA Information

## 2012-02-06 ENCOUNTER — Encounter (HOSPITAL_COMMUNITY)
Admission: RE | Disposition: A | Discharge status: Home / Self Care | Payer: Self-pay | Source: Ambulatory Visit | Attending: Obstetrics and Gynecology

## 2012-02-06 ENCOUNTER — Encounter (HOSPITAL_COMMUNITY): Payer: Self-pay | Admitting: Anesthesiology

## 2012-02-06 ENCOUNTER — Ambulatory Visit (HOSPITAL_COMMUNITY): Payer: 59 | Admitting: Anesthesiology

## 2012-02-06 ENCOUNTER — Ambulatory Visit (HOSPITAL_COMMUNITY)
Admission: RE | Admit: 2012-02-06 | Discharge: 2012-02-07 | Disposition: A | Discharge status: Home / Self Care | Payer: 59 | Source: Ambulatory Visit | Attending: Obstetrics and Gynecology | Admitting: Obstetrics and Gynecology

## 2012-02-06 ENCOUNTER — Encounter (HOSPITAL_COMMUNITY): Payer: Self-pay | Admitting: *Deleted

## 2012-02-06 DIAGNOSIS — Z23 Encounter for immunization: Secondary | ICD-10-CM | POA: Insufficient documentation

## 2012-02-06 DIAGNOSIS — Z01812 Encounter for preprocedural laboratory examination: Secondary | ICD-10-CM | POA: Insufficient documentation

## 2012-02-06 DIAGNOSIS — N920 Excessive and frequent menstruation with regular cycle: Secondary | ICD-10-CM | POA: Insufficient documentation

## 2012-02-06 DIAGNOSIS — D279 Benign neoplasm of unspecified ovary: Secondary | ICD-10-CM | POA: Insufficient documentation

## 2012-02-06 DIAGNOSIS — N949 Unspecified condition associated with female genital organs and menstrual cycle: Secondary | ICD-10-CM | POA: Insufficient documentation

## 2012-02-06 DIAGNOSIS — Z01818 Encounter for other preprocedural examination: Secondary | ICD-10-CM | POA: Insufficient documentation

## 2012-02-06 DIAGNOSIS — Z9889 Other specified postprocedural states: Secondary | ICD-10-CM

## 2012-02-06 HISTORY — PX: OVARIAN CYST REMOVAL: SHX89

## 2012-02-06 HISTORY — PX: DILITATION & CURRETTAGE/HYSTROSCOPY WITH NOVASURE ABLATION: SHX5568

## 2012-02-06 HISTORY — PX: CYSTOSCOPY: SHX5120

## 2012-02-06 HISTORY — PX: LAPAROSCOPY: SHX197

## 2012-02-06 HISTORY — PX: BILATERAL SALPINGECTOMY: SHX5743

## 2012-02-06 LAB — CBC
MCH: 27.2 pg (ref 26.0–34.0)
Platelets: 367 10*3/uL (ref 150–400)
RBC: 4.12 MIL/uL (ref 3.87–5.11)
RDW: 13.5 % (ref 11.5–15.5)
WBC: 16.5 10*3/uL — ABNORMAL HIGH (ref 4.0–10.5)

## 2012-02-06 LAB — HCG, SERUM, QUALITATIVE: Preg, Serum: NEGATIVE

## 2012-02-06 SURGERY — LAPAROSCOPY OPERATIVE
Anesthesia: General | Site: Uterus | Laterality: Right | Wound class: Clean Contaminated

## 2012-02-06 MED ORDER — BUPIVACAINE HCL (PF) 0.25 % IJ SOLN
INTRAMUSCULAR | Status: AC
Start: 2012-02-06 — End: 2012-02-06
  Filled 2012-02-06: qty 60

## 2012-02-06 MED ORDER — KETOROLAC TROMETHAMINE 30 MG/ML IJ SOLN
15.0000 mg | Freq: Once | INTRAMUSCULAR | Status: AC | PRN
  Administered 2012-02-06: 30 mg via INTRAVENOUS

## 2012-02-06 MED ORDER — INDIGOTINDISULFONATE SODIUM 8 MG/ML IJ SOLN
INTRAMUSCULAR | Status: DC | PRN
  Administered 2012-02-06: 5 mL via INTRAVENOUS

## 2012-02-06 MED ORDER — PROPOFOL 10 MG/ML IV EMUL
INTRAVENOUS | Status: DC | PRN
  Administered 2012-02-06 (×2): 50 mg via INTRAVENOUS
  Administered 2012-02-06: 200 mg via INTRAVENOUS

## 2012-02-06 MED ORDER — KETOROLAC TROMETHAMINE 30 MG/ML IJ SOLN
INTRAMUSCULAR | Status: AC
  Administered 2012-02-06: 30 mg via INTRAVENOUS
  Filled 2012-02-06: qty 1

## 2012-02-06 MED ORDER — NEOSTIGMINE METHYLSULFATE 1 MG/ML IJ SOLN
INTRAMUSCULAR | Status: AC
Start: 2012-02-06 — End: 2012-02-06
  Filled 2012-02-06: qty 1

## 2012-02-06 MED ORDER — NEOSTIGMINE METHYLSULFATE 1 MG/ML IJ SOLN
INTRAMUSCULAR | Status: DC | PRN
  Administered 2012-02-06: 3 mg via INTRAVENOUS

## 2012-02-06 MED ORDER — LIDOCAINE HCL (CARDIAC) 20 MG/ML IV SOLN
INTRAVENOUS | Status: AC
  Filled 2012-02-06: qty 5

## 2012-02-06 MED ORDER — ONDANSETRON HCL 4 MG/2ML IJ SOLN
INTRAMUSCULAR | Status: AC
  Filled 2012-02-06: qty 2

## 2012-02-06 MED ORDER — DEXAMETHASONE SODIUM PHOSPHATE 4 MG/ML IJ SOLN
INTRAMUSCULAR | Status: DC | PRN
  Administered 2012-02-06: 10 mg via INTRAVENOUS

## 2012-02-06 MED ORDER — IBUPROFEN 600 MG PO TABS
600.0000 mg | ORAL_TABLET | Freq: Four times a day (QID) | ORAL | Status: DC | PRN
  Administered 2012-02-07: 600 mg via ORAL
  Filled 2012-02-06: qty 1

## 2012-02-06 MED ORDER — FENTANYL CITRATE 0.05 MG/ML IJ SOLN
INTRAMUSCULAR | Status: AC
  Filled 2012-02-06: qty 5

## 2012-02-06 MED ORDER — HYDROMORPHONE HCL PF 1 MG/ML IJ SOLN
INTRAMUSCULAR | Status: AC
  Administered 2012-02-06: 0.5 mg via INTRAVENOUS
  Filled 2012-02-06: qty 1

## 2012-02-06 MED ORDER — ROCURONIUM BROMIDE 50 MG/5ML IV SOLN
INTRAVENOUS | Status: AC
  Filled 2012-02-06: qty 1

## 2012-02-06 MED ORDER — FENTANYL CITRATE 0.05 MG/ML IJ SOLN
INTRAMUSCULAR | Status: DC | PRN
  Administered 2012-02-06 (×3): 50 ug via INTRAVENOUS
  Administered 2012-02-06: 100 ug via INTRAVENOUS
  Administered 2012-02-06 (×2): 50 ug via INTRAVENOUS

## 2012-02-06 MED ORDER — PROPOFOL 10 MG/ML IV EMUL
INTRAVENOUS | Status: AC
  Filled 2012-02-06: qty 20

## 2012-02-06 MED ORDER — GLYCOPYRROLATE 0.2 MG/ML IJ SOLN
INTRAMUSCULAR | Status: DC | PRN
  Administered 2012-02-06: 0.6 mg via INTRAVENOUS

## 2012-02-06 MED ORDER — INDIGOTINDISULFONATE SODIUM 8 MG/ML IJ SOLN
INTRAMUSCULAR | Status: AC
  Filled 2012-02-06: qty 5

## 2012-02-06 MED ORDER — OXYCODONE-ACETAMINOPHEN 5-325 MG PO TABS
1.0000 | ORAL_TABLET | ORAL | Status: DC | PRN
  Administered 2012-02-06 (×2): 1 via ORAL
  Administered 2012-02-07 (×3): 2 via ORAL
  Filled 2012-02-06: qty 2
  Filled 2012-02-06: qty 1
  Filled 2012-02-06 (×2): qty 2
  Filled 2012-02-06: qty 1

## 2012-02-06 MED ORDER — MIDAZOLAM HCL 5 MG/5ML IJ SOLN
INTRAMUSCULAR | Status: DC | PRN
  Administered 2012-02-06: 2 mg via INTRAVENOUS

## 2012-02-06 MED ORDER — KETOROLAC TROMETHAMINE 30 MG/ML IJ SOLN: 30.0000 mg | Freq: Four times a day (QID) | INTRAMUSCULAR | Status: DC

## 2012-02-06 MED ORDER — STERILE WATER FOR IRRIGATION IR SOLN
Status: DC | PRN
  Administered 2012-02-06: 1000 mL

## 2012-02-06 MED ORDER — LACTATED RINGERS IV SOLN
INTRAVENOUS | Status: DC
  Administered 2012-02-06 (×3): via INTRAVENOUS

## 2012-02-06 MED ORDER — ONDANSETRON HCL 4 MG/2ML IJ SOLN
INTRAMUSCULAR | Status: DC | PRN
  Administered 2012-02-06 (×2): 4 mg via INTRAVENOUS

## 2012-02-06 MED ORDER — HEPARIN SODIUM (PORCINE) 5000 UNIT/ML IJ SOLN
INTRAMUSCULAR | Status: DC | PRN
  Administered 2012-02-06: 5000 [IU]

## 2012-02-06 MED ORDER — ROCURONIUM BROMIDE 100 MG/10ML IV SOLN
INTRAVENOUS | Status: DC | PRN
  Administered 2012-02-06: 10 mg via INTRAVENOUS
  Administered 2012-02-06: 70 mg via INTRAVENOUS
  Administered 2012-02-06 (×5): 10 mg via INTRAVENOUS

## 2012-02-06 MED ORDER — BUPIVACAINE HCL (PF) 0.25 % IJ SOLN
INTRAMUSCULAR | Status: AC
  Filled 2012-02-06: qty 30

## 2012-02-06 MED ORDER — DEXAMETHASONE SODIUM PHOSPHATE 10 MG/ML IJ SOLN
INTRAMUSCULAR | Status: AC
  Filled 2012-02-06: qty 1

## 2012-02-06 MED ORDER — LIDOCAINE HCL 1 % IJ SOLN
INTRAMUSCULAR | Status: DC | PRN
  Administered 2012-02-06: 10 mL

## 2012-02-06 MED ORDER — MEPERIDINE HCL 25 MG/ML IJ SOLN: 6.2500 mg | INTRAMUSCULAR | Status: DC | PRN

## 2012-02-06 MED ORDER — MIDAZOLAM HCL 2 MG/2ML IJ SOLN
INTRAMUSCULAR | Status: AC
  Filled 2012-02-06: qty 2

## 2012-02-06 MED ORDER — GLYCOPYRROLATE 0.2 MG/ML IJ SOLN
INTRAMUSCULAR | Status: AC
Start: 2012-02-06 — End: 2012-02-06
  Filled 2012-02-06: qty 3

## 2012-02-06 MED ORDER — INFLUENZA VIRUS VACC SPLIT PF IM SUSP
0.5000 mL | INTRAMUSCULAR | Status: AC
  Administered 2012-02-07: 0.5 mL via INTRAMUSCULAR

## 2012-02-06 MED ORDER — LIDOCAINE HCL (CARDIAC) 20 MG/ML IV SOLN
INTRAVENOUS | Status: DC | PRN
  Administered 2012-02-06: 100 mg via INTRAVENOUS

## 2012-02-06 MED ORDER — HYDROMORPHONE HCL PF 1 MG/ML IJ SOLN
1.0000 mg | INTRAMUSCULAR | Status: DC | PRN
  Administered 2012-02-06: 1 mg via INTRAVENOUS
  Filled 2012-02-06: qty 1

## 2012-02-06 MED ORDER — HYDROMORPHONE HCL PF 1 MG/ML IJ SOLN
0.2500 mg | INTRAMUSCULAR | Status: DC | PRN
  Administered 2012-02-06 (×3): 0.5 mg via INTRAVENOUS

## 2012-02-06 MED ORDER — BUPIVACAINE HCL (PF) 0.25 % IJ SOLN
INTRAMUSCULAR | Status: DC | PRN
  Administered 2012-02-06: 18 mL

## 2012-02-06 MED ORDER — LACTATED RINGERS IR SOLN
Status: DC | PRN
  Administered 2012-02-06: 15000 mL

## 2012-02-06 MED ORDER — HYDROMORPHONE HCL PF 1 MG/ML IJ SOLN: 1.0000 mg | Freq: Once | INTRAMUSCULAR | Status: DC

## 2012-02-06 MED ORDER — FENTANYL CITRATE 0.05 MG/ML IJ SOLN
INTRAMUSCULAR | Status: AC
  Filled 2012-02-06: qty 2

## 2012-02-06 MED ORDER — METOCLOPRAMIDE HCL 5 MG/ML IJ SOLN: 10.0000 mg | Freq: Once | INTRAMUSCULAR | Status: DC | PRN

## 2012-02-06 SURGICAL SUPPLY — 77 items
ABLATOR ENDOMETRIAL BIPOLAR (ABLATOR) IMPLANT
APPLICATOR COTTON TIP 6IN STRL (MISCELLANEOUS) ×70 IMPLANT
BARRIER ADHS 3X4 INTERCEED (GAUZE/BANDAGES/DRESSINGS) IMPLANT
CABLE HIGH FREQUENCY MONO STRZ (ELECTRODE) ×7 IMPLANT
CANISTER SUCTION 2500CC (MISCELLANEOUS) ×49 IMPLANT
CATH ROBINSON RED A/P 16FR (CATHETERS) ×7 IMPLANT
CHLORAPREP W/TINT 26ML (MISCELLANEOUS) ×14 IMPLANT
CLOTH BEACON ORANGE TIMEOUT ST (SAFETY) ×7 IMPLANT
CONT PATH 16OZ SNAP LID 3702 (MISCELLANEOUS) ×14 IMPLANT
CONTAINER PREFILL 10% NBF 60ML (FORM) ×14 IMPLANT
DECANTER SPIKE VIAL GLASS SM (MISCELLANEOUS) ×21 IMPLANT
DERMABOND ADVANCED (GAUZE/BANDAGES/DRESSINGS) ×1
DERMABOND ADVANCED .7 DNX12 (GAUZE/BANDAGES/DRESSINGS) ×6 IMPLANT
DISSECTOR SPONGE CHERRY (GAUZE/BANDAGES/DRESSINGS) IMPLANT
DRESSING TELFA 8X3 (GAUZE/BANDAGES/DRESSINGS) ×7 IMPLANT
ELECT REM PT RETURN 9FT ADLT (ELECTROSURGICAL) ×7
ELECTRODE REM PT RTRN 9FT ADLT (ELECTROSURGICAL) ×6 IMPLANT
FORCEPS CUTTING 33CM 5MM (CUTTING FORCEPS) ×7 IMPLANT
FORCEPS CUTTING 45CM 5MM (CUTTING FORCEPS) IMPLANT
GAUZE SPONGE 4X4 16PLY XRAY LF (GAUZE/BANDAGES/DRESSINGS) ×14 IMPLANT
GLOVE BIO SURGEON STRL SZ7 (GLOVE) ×14 IMPLANT
GLOVE BIO SURGEON STRL SZ7.5 (GLOVE) ×21 IMPLANT
GLOVE BIOGEL PI IND STRL 6.5 (GLOVE) ×18 IMPLANT
GLOVE BIOGEL PI IND STRL 7.0 (GLOVE) ×18 IMPLANT
GLOVE BIOGEL PI IND STRL 7.5 (GLOVE) ×12 IMPLANT
GLOVE BIOGEL PI INDICATOR 6.5 (GLOVE) ×3
GLOVE BIOGEL PI INDICATOR 7.0 (GLOVE) ×3
GLOVE BIOGEL PI INDICATOR 7.5 (GLOVE) ×2
GLOVE ECLIPSE 6.0 STRL STRAW (GLOVE) ×28 IMPLANT
GOWN PREVENTION PLUS LG XLONG (DISPOSABLE) ×14 IMPLANT
GOWN STRL REIN XL XLG (GOWN DISPOSABLE) ×35 IMPLANT
HEMOSTAT SURGICEL 4X8 (HEMOSTASIS) ×14 IMPLANT
NEEDLE HYPO 18GX1.5 BLUNT FILL (NEEDLE) ×7 IMPLANT
NEEDLE HYPO 25X1 1.5 SAFETY (NEEDLE) ×7 IMPLANT
NEEDLE INSUFFLATION 14GA 120MM (NEEDLE) ×7 IMPLANT
NS IRRIG 1000ML POUR BTL (IV SOLUTION) ×7 IMPLANT
PACK ABDOMINAL GYN (CUSTOM PROCEDURE TRAY) ×7 IMPLANT
PACK HYSTEROSCOPY LF (CUSTOM PROCEDURE TRAY) ×14 IMPLANT
PACK LAPAROSCOPY BASIN (CUSTOM PROCEDURE TRAY) ×7 IMPLANT
PAD OB MATERNITY 4.3X12.25 (PERSONAL CARE ITEMS) ×14 IMPLANT
PAD PREP 24X48 CUFFED NSTRL (MISCELLANEOUS) ×7 IMPLANT
POUCH SPECIMEN RETRIEVAL 10MM (ENDOMECHANICALS) ×7 IMPLANT
PROTECTOR NERVE ULNAR (MISCELLANEOUS) ×14 IMPLANT
SCISSORS LAP 5X35 DISP (ENDOMECHANICALS) ×7 IMPLANT
SET CYSTO W/LG BORE CLAMP LF (SET/KITS/TRAYS/PACK) ×7 IMPLANT
SET IRRIG TUBING LAPAROSCOPIC (IRRIGATION / IRRIGATOR) ×7 IMPLANT
SLEEVE Z-THREAD 5X100MM (TROCAR) ×7 IMPLANT
SOLUTION ANTI FOG 6CC (MISCELLANEOUS) ×7 IMPLANT
SOLUTION ELECTROLUBE (MISCELLANEOUS) IMPLANT
SPONGE LAP 18X18 X RAY DECT (DISPOSABLE) ×7 IMPLANT
STAPLER VISISTAT 35W (STAPLE) IMPLANT
STRIP CLOSURE SKIN 1/4X4 (GAUZE/BANDAGES/DRESSINGS) IMPLANT
SUT CHROMIC 2 0 CT 1 (SUTURE) ×7 IMPLANT
SUT CHROMIC 2 0 SH (SUTURE) IMPLANT
SUT MNCRL AB 3-0 PS2 27 (SUTURE) ×7 IMPLANT
SUT MON AB 3-0 SH 27 (SUTURE)
SUT MON AB 3-0 SH27 (SUTURE) IMPLANT
SUT MON AB 4-0 PS1 27 (SUTURE) ×7 IMPLANT
SUT PDS AB 1 CTX 36 (SUTURE) IMPLANT
SUT PLAIN 2 0 XLH (SUTURE) ×7 IMPLANT
SUT VIC AB 0 CT1 18XCR BRD8 (SUTURE) IMPLANT
SUT VIC AB 0 CT1 27 (SUTURE) ×4
SUT VIC AB 0 CT1 27XBRD ANBCTR (SUTURE) ×24 IMPLANT
SUT VIC AB 0 CT1 8-18 (SUTURE)
SUT VICRYL 0 ENDOLOOP (SUTURE) IMPLANT
SUT VICRYL 0 TIES 12 18 (SUTURE) ×7 IMPLANT
SUT VICRYL 0 UR6 27IN ABS (SUTURE) ×7 IMPLANT
SYR 50ML LL SCALE MARK (SYRINGE) ×7 IMPLANT
SYR CONTROL 10ML LL (SYRINGE) ×7 IMPLANT
SYRINGE 10CC LL (SYRINGE) ×7 IMPLANT
TOWEL OR 17X24 6PK STRL BLUE (TOWEL DISPOSABLE) ×21 IMPLANT
TOWEL OR 17X26 10 PK STRL BLUE (TOWEL DISPOSABLE) ×7 IMPLANT
TRAY FOLEY CATH 14FR (SET/KITS/TRAYS/PACK) ×7 IMPLANT
TROCAR Z-THREAD FIOS 11X100 BL (TROCAR) ×14 IMPLANT
TROCAR Z-THREAD FIOS 5X100MM (TROCAR) ×21 IMPLANT
WARMER LAPAROSCOPE (MISCELLANEOUS) ×7 IMPLANT
WATER STERILE IRR 1000ML POUR (IV SOLUTION) ×7 IMPLANT

## 2012-02-06 NOTE — Progress Notes (Signed)
Day of Surgery Procedure(s) (LRB): LAPAROSCOPY OPERATIVE (N/A) DILATATION & CURETTAGE/HYSTEROSCOPY WITH NOVASURE ABLATION (N/A) CYSTOSCOPY (N/A) OVARIAN CYSTECTOMY (Bilateral) BILATERAL SALPINGECTOMY (Bilateral)  Subjective: Patient reports some incisional pain but overall ok.   Objective: I have reviewed patient's vital signs and intake and output. UOP 100cc/2hr  General: alert and no distress Resp: clear to auscultation bilaterally Cardio: regular rate and rhythm GI: soft, app tender, ND, NABS Extremities: Homans sign is negative, no sign of DVT, SCDs are on  Assessment: s/p Procedure(s) (LRB) with comments: LAPAROSCOPY OPERATIVE (N/A) DILATATION & CURETTAGE/HYSTEROSCOPY WITH NOVASURE ABLATION (N/A) -  novasure CYSTOSCOPY (N/A) OVARIAN CYSTECTOMY (Bilateral) BILATERAL SALPINGECTOMY (Bilateral) - partial bilateral salpingectomy: stable  Plan: Advance diet Encourage ambulation Cont strict i's and o's SCDs for DVT prophylaxis Anticipate d/c tomorrow CBC in am  LOS: 0 days    Tonio Seider Y 02/06/2012, 7:46 PM

## 2012-02-06 NOTE — Op Note (Signed)
Preop Diagnosis: Large Ovarian Mass, Menorrhagia   Postop Diagnosis: Large Bilateral Ovarian Masses,  Menorrhagia   Procedure: LAPAROSCOPIC OVARIAN BILATERAL CYSTECTOMY LAPAROSCOPIC PARTIAL SALPINGO OOPHORECTOMY DILATATION AND CURETTAGE /HYSTEROSCOPY ENDOMETRIAL ABLATION VIA NOVASURE CYSTOSCOPY  Anesthesia: General   Anesthesiologist: Dr. Arby Barrette then Dr. Malen Gauze  Attending: Purcell Nails, MD   Assistant:  Jaymes Graff, MD  Findings: Ut sound 10cm, cervical length 5.5 cm, cavity length 4.5cm , cavity width 3.5cm, time 90secs  Pathology: 1.bilateral dermoid cysts in pieces 2. Portions of bilateral ovaries 3. Portion of bilateral tubes right tube specifically left tube may have accompanied portion of ovary/cyst 4.Pelvic washings  Fluids: See flowsheet  UOP: See flowsheet  EBL: 100cc  Complications: None  Procedure: The patient was taken to the operating room after the risks, benefits, alternatives, complications, treatment options, and expected outcomes were discussed with the patient. The patient verbalized understanding, the patient concurred with the proposed plan and consent signed and witnessed. The patient was taken to the Operating Room, identified as Alyssa Rangel and the procedure verified as laparoscopic bilateral tubal fulguration. A Time Out was held and the above information confirmed.  The patient was placed under general anesthesia per anesthesia staff, the patient was placed in modified dorsal lithotomy position and was prepped, draped, and catheterized in the normal, sterile fashion.  The cervix was visualized and an intrauterine manipulator was placed. A  10 mm umbilical incision was then performed. Veress needle was passed and pneumoperitoneum was established. A 10 mm trocar was advanced into the intraabdominal cavity, the operative laparoscope was introduced and findings as noted above.  Patient was placed in trendelenburg and marcaine injected  in the RLQ and a 5 mm incision was made and 5 mm trocar advanced into the intraabdominal cavity.  The same was done on the contralateral side.  There were several adhesions of both ovaries to the side walls.  The right ovarian cyst was first dissected out and decompressed and fluid suctioned as much as possible.  There was not clear delineation between the ovary and cyst because of adhesions and size of cysts.  Part of the ovary was therefore excised and part of the fallopian tube as well.  The same was done on the contralateral side.  The right ureter was unable to be visualized secondary to edema from hydro-dissection in the side wall.  Hemostasis of ovarian beds was assured.  Abdomen was covered.  Attention was then turned to the perineum and a bivalve speculum was placed in the patient's vagina and the anterior lip of the cervix was grasped with a single tooth tenaculum. A paracervical block was administered using a total of 10 cc of 1% lidocaine. The uterus sounded to 8 cm. The cervix was dilated for passage of the hysteroscope. The hysteroscope was introduced into the uterine cavity and findings as noted above. Sharp curettage was performed until a gritty texture was noted and currettings sent to pathology. The hysteroscope was reintroduced and no obvious intracavitary lesions were noted. The Novasure instrument was introduced and ablation performed without difficulty. Hysteroscope reintroduced and good ablation results were noted.  Indigo carmine was administered and cystoscopy was performed and bilateral ureters were noted to efflux without difficulty.  Attention was returned to the abdomen and intraabdominal cavity was copiously irrigated secondary to leakage of cyst contents.  Hemostasis was noted of all pedicles.  Surgicel was placed on the ovarian bed on the right to maintain hemostasis.   Pneumoperitoneum was relieved, all instruments were removed.  The two 5mm incisions were repaired with  dermabond, the 10mm fascial incision at the umbilicus was repaired with 0 vicryl and skin reapproximated with 3-0 monocryl via a subcuticular stitch.  Sponge lap and needle count was correct. The patient tolerated the procedure well and was returned to the recovery room in good condition.

## 2012-02-06 NOTE — Interval H&P Note (Signed)
History and Physical Interval Note:  02/06/2012 9:54 AM  Alyssa Rangel  has presented today for surgery, with the diagnosis of Large Ovarian Mass,  Fibroids, Menorrhagia  The various methods of treatment have been discussed with the patient and family. After consideration of risks, benefits and other options for treatment, the patient has consented to  Procedure(s) (LRB) with comments: LAPAROSCOPY OPERATIVE (N/A) - ovarian cystectomy EXPLORATORY LAPAROTOMY (N/A) SALPINGO OOPHORECTOMY (Right) DILATATION & CURETTAGE/HYSTEROSCOPY WITH NOVASURE ABLATION (N/A) - proable novasure as a surgical intervention .  The patient's history has been reviewed, patient examined, no change in status, stable for surgery.  I have reviewed the patient's chart and labs.  Questions were answered to the patient's satisfaction.     [name] Y  Reviewed procedure with the patient - Laparoscopic cystectomy, possible oophorectomy, possible laparotomy, possible bilateral salpingectomy, hysteroscopy, d&c and ablation.  I discussed the removal of both fallopian tubes and she is agreeable.  The risks, benefits and alternatives were discussed and consent signed and witnessed.  Pt did not have any questions.

## 2012-02-06 NOTE — Anesthesia Procedure Notes (Signed)
Procedure Name: Intubation Date/Time: 02/06/2012 10:39 AM Performed by: Quentin Ore Pre-anesthesia Checklist: Patient identified, Emergency Drugs available, Suction available, Patient being monitored and Timeout performed Patient Re-evaluated:Patient Re-evaluated prior to inductionOxygen Delivery Method: Circle system utilized Preoxygenation: Pre-oxygenation with 100% oxygen Intubation Type: IV induction Ventilation: Mask ventilation without difficulty Laryngoscope Size: Mac and 3 Grade View: Grade I Tube type: Oral Tube size: 7.0 mm Number of attempts: 1 Airway Equipment and Method: Stylet Placement Confirmation: ETT inserted through vocal cords under direct vision,  positive ETCO2 and breath sounds checked- equal and bilateral Secured at: 21 cm Tube secured with: Tape Dental Injury: Teeth and Oropharynx as per pre-operative assessment

## 2012-02-06 NOTE — Anesthesia Preprocedure Evaluation (Addendum)
Anesthesia Evaluation  Patient identified by MRN, date of birth, ID band Patient awake    Reviewed: Allergy & Precautions, H&P , NPO status , Patient's Chart, lab work & pertinent test results  Airway Mallampati: I TM Distance: >3 FB Neck ROM: full    Dental No notable dental hx.    Pulmonary neg pulmonary ROS,    Pulmonary exam normal       Cardiovascular     Neuro/Psych negative neurological ROS  negative psych ROS   GI/Hepatic negative GI ROS, Neg liver ROS,   Endo/Other  Morbid obesity  Renal/GU negative Renal ROS  negative genitourinary   Musculoskeletal   Abdominal (+) + obese,   Peds negative pediatric ROS (+)  Hematology negative hematology ROS (+)   Anesthesia Other Findings   Reproductive/Obstetrics (+) Pregnancy                          Anesthesia Physical Anesthesia Plan  ASA: III  Anesthesia Plan: General   Post-op Pain Management:    Induction: Intravenous  Airway Management Planned: Oral ETT  Additional Equipment:   Intra-op Plan:   Post-operative Plan: Extubation in OR  Informed Consent: I have reviewed the patients History and Physical, chart, labs and discussed the procedure including the risks, benefits and alternatives for the proposed anesthesia with the patient or authorized representative who has indicated his/her understanding and acceptance.   Dental advisory given  Plan Discussed with: CRNA and Surgeon  Anesthesia Plan Comments:        Anesthesia Quick Evaluation

## 2012-02-06 NOTE — Interval H&P Note (Signed)
History and Physical Interval Note:  02/06/2012 8:57 AM  Alyssa Rangel  has presented today for surgery, with the diagnosis of Large Ovarian Mass,  Fibroids, Menorrhagia  The various methods of treatment have been discussed with the patient and family. After consideration of risks, benefits and other options for treatment, the patient has consented to  Procedure(s) (LRB) with comments: LAPAROSCOPY OPERATIVE (N/A) - ovarian cystectomy EXPLORATORY LAPAROTOMY (N/A) SALPINGO OOPHORECTOMY (Right) DILATATION & CURETTAGE/HYSTEROSCOPY WITH NOVASURE ABLATION (N/A) - proable novasure as a surgical intervention .  The patient's history has been reviewed, patient examined, no change in status, stable for surgery.  I have reviewed the patient's chart and labs.  Questions were answered to the patient's satisfaction.     Purcell Nails

## 2012-02-06 NOTE — Preoperative (Signed)
Beta Blockers   Reason not to administer Beta Blockers:Not Applicable 

## 2012-02-06 NOTE — H&P (View-Only) (Signed)
Alyssa Rangel is a 47 y.o. female G 0. who presents for a right ovarian cystectomy and endometrial ablation because of a large dermoid cyst and menorrhagia.  In September 2013, patient went to the emergency department because of severe pelvic pain.  She was found on CT and pelvic ultrasound scans to have a retroverted uterus-8.0 x 3.9 x 3.1 cm with a 3.7 x 3.9 x 4.4 cm fundal fibroid and a large right complex cystic/solid right adnexal mass measuring 14.3 x 9.1 x 14.8 cm. the mass contained within,  an echogenic nodule within the cystic component that reflects a dermoid plug; left ovary was not visualized.  These findings were consistent with a right dermoid cyst.  Additionally the patient reports a menstrual flow that lasts for 5 days with the change of two maxi pads five times a day. In addition she has cramping rated as a 10/10 on a 10 point pain scale that is mildly relieved with Aleve.  Lately she has been skipping periods and denies any intermenstrual bleeding.  Since the development of the dermoid cyst she has experienced 7/10 pain most days of the week with a decrease to 5/10 on a 10 point pain scale with #2 Aleve. Pain is described as a shooting pain in the vagina as well as a dull ache at other times.  She has been having increasing nausea with some vomiting but no fever, diarrhea, constipation  or dysuria. She admits to urinary frequency, urgency and occasional incontinence,  but states  is not a new symptom. Given her radiographic findings along with symptoms of heavy bleeding the patient has decided to forgo any medical management and proceed with surgery in the form of ovarian cystectomy and endometrial ablation.   Past Medical History  OB History: No obstetric history on file.   GYN History: menarche: 47 YO;    LMP: 12/23/2011    Contracepton abstinence  The patient denies history of sexually transmitted disease.  Denies history of abnormal PAP smear  Last PAP smear 2013  Medical  History: Rheumatoid arthritis, hypertension, hip pain, hyprercholesterolemia  Surgical History: Negative Denies problems with anesthesia or history of blood transfusions  Family History: Diabetes, hypertension and cardiovascular disease  Social History:   Single and employed with [employer]: Denies tobacco or illicit drug use but admits to consuming alcohol socially   Outpatient Encounter Prescriptions as of 01/26/2012  Medication Sig Dispense Refill  . ferrous sulfate 325 (65 FE) MG tablet Take 325 mg by mouth 2 (two) times a week. Tuesdays and Thursdays.      . naproxen (NAPROSYN) 375 MG tablet Take 1 tablet (375 mg total) by mouth 2 (two) times daily.  20 tablet  0  . oxyCODONE-acetaminophen (PERCOCET/ROXICET) 5-325 MG per tablet Take 1 tablet by mouth every 4 (four) hours as needed. For pain  15 tablet  0   Denies sensitivity to peanuts, shellfish, soy, latex or adhesives.   No Known Allergies  ROS: Admits to nausea, occasional vomiting, urinary urgency, frequency and occasional incontinence;  Denies headache, vision changes, nasal congestion, dysphagia, tinnitus, dizziness, hoarseness, cough,  chest pain, shortness of breath,  diarrhea,constipation, dysuria, hematuria, vaginitis symptoms, swelling of joints,easy bruising,  myalgias, arthralgias, skin rashes, unexplained weight loss and except as is mentioned in the history of present illness, patient's review of systems is otherwise negative.  Physical Exam  BP 128/62  Pulse 70  Temp 97.9 F (36.6 C) (Oral)  Resp 16  Ht 5' 4" (1.626 m)  Wt   233 lb (105.688 kg)  BMI 39.99 kg/m2  LMP 12/23/2011  Neck: supple without masses or thyromegaly Lungs: clear to auscultation Heart: regular rate and rhythm Abdomen: tender diffusely, RLQ > elsewhere with the ability to outline a large mass right of midline-below umbilicus Pelvic:EGBUS- wnl; vagina-normal rugae; uterus-difficulty to assess due to patient discomfort and pelvic fullness,  cervix without lesions or motion tenderness; adnexae-right side tender; left with mild tenderness Extremities:  no clubbing, cyanosis or edema  Assesment:  Right Dermoid Cyst                       Menorrhagia   Disposition:  A discussion was held with patient regarding the indication for her procedure(s) along with the risks, which include but are not limited to: reaction to anesthesia, damage to adjacent organs, infection, excessive bleeding, possible need for an open abdominal incision and possible oophorectomy.  The patient verbalized understanding of these risks and has consented to proceed with an Operative Laparoscopy with a possible Laparotomy, Laparoscopic Cystectomy with possible Oophorectomy, Hysteroscopy with Dilatation and Curettage and Endometrial Ablation at [facility] on February 06, 2012 at 9:30 a.m.   CSN# [id number]   Karsyn Jamie J. Markas Aldredge, PA-C  for Dr. Angela Y. Roberts  

## 2012-02-06 NOTE — Anesthesia Postprocedure Evaluation (Signed)
  Anesthesia Post-op Note  Patient: Alyssa Rangel  Procedure(s) Performed: Procedure(s) (LRB) with comments: LAPAROSCOPY OPERATIVE (N/A) DILATATION & CURETTAGE/HYSTEROSCOPY WITH NOVASURE ABLATION (N/A) -  novasure CYSTOSCOPY (N/A) OVARIAN CYSTECTOMY (Bilateral) BILATERAL SALPINGECTOMY (Bilateral) - partial bilateral salpingectomy  Patient Location: PACU  Anesthesia Type:General  Level of Consciousness: awake, alert  and oriented  Airway and Oxygen Therapy: Patient Spontanous Breathing  Post-op Pain: mild, moderate  Post-op Assessment: Post-op Vital signs reviewed, Patient's Cardiovascular Status Stable, Respiratory Function Stable, Patent Airway, No signs of Nausea or vomiting and Pain level controlled  Post-op Vital Signs: Reviewed and stable  Complications: No apparent anesthesia complications

## 2012-02-06 NOTE — Transfer of Care (Signed)
Immediate Anesthesia Transfer of Care Note  Patient: Alyssa Rangel  Procedure(s) Performed: Procedure(s) (LRB) with comments: LAPAROSCOPY OPERATIVE (N/A) DILATATION & CURETTAGE/HYSTEROSCOPY WITH NOVASURE ABLATION (N/A) -  novasure CYSTOSCOPY (N/A) OVARIAN CYSTECTOMY (Bilateral) BILATERAL SALPINGECTOMY (Bilateral) - partial bilateral salpingectomy  Patient Location: PACU  Anesthesia Type:General  Level of Consciousness: sedated  Airway & Oxygen Therapy: Patient Spontanous Breathing and Patient connected to nasal cannula oxygen  Post-op Assessment: Report given to PACU RN and Post -op Vital signs reviewed and stable  Post vital signs: stable  Complications: No apparent anesthesia complications

## 2012-02-07 ENCOUNTER — Encounter (HOSPITAL_COMMUNITY): Payer: Self-pay | Admitting: Obstetrics and Gynecology

## 2012-02-07 LAB — CBC
HCT: 32.7 % — ABNORMAL LOW (ref 36.0–46.0)
MCHC: 31.5 g/dL (ref 30.0–36.0)
RDW: 13.7 % (ref 11.5–15.5)
WBC: 13.1 10*3/uL — ABNORMAL HIGH (ref 4.0–10.5)

## 2012-02-07 MED ORDER — IBUPROFEN 600 MG PO TABS: 600.0000 mg | ORAL_TABLET | Freq: Four times a day (QID) | ORAL | Status: DC | PRN

## 2012-02-07 MED ORDER — OXYCODONE-ACETAMINOPHEN 5-325 MG PO TABS: 1.0000 | ORAL_TABLET | ORAL | Status: DC | PRN

## 2012-02-07 MED ORDER — PNEUMOCOCCAL VAC POLYVALENT 25 MCG/0.5ML IJ INJ: 0.5000 mL | INJECTION | INTRAMUSCULAR | Status: DC

## 2012-02-07 MED ORDER — PNEUMOCOCCAL VAC POLYVALENT 25 MCG/0.5ML IJ INJ
0.5000 mL | INJECTION | INTRAMUSCULAR | Status: AC
  Administered 2012-02-07: 0.5 mL via INTRAMUSCULAR
  Filled 2012-02-07: qty 0.5

## 2012-02-07 MED FILL — Heparin Sodium (Porcine) Inj 5000 Unit/ML: INTRAMUSCULAR | Qty: 2 | Status: AC

## 2012-02-07 NOTE — Progress Notes (Addendum)
Alyssa Rangel is a46 y.o.  [id number]  Post Op Date #1; Hysteroscopy D & C/Operative Laparoscopy/Endometrial Ablation  Subjective: Patient is Doing well postoperatively. Patient has Pain well controlled with oral medications, only complains of gas pain. Tolerating a regular diet, passed flatus, voiding and ambulating.,   Objective: Vital signs in last 24 hours: Temp:  [96 F (35.6 C)-98.7 F (37.1 C)] 98.5 F (36.9 C) (01/29 0520) Pulse Rate:  [65-93] 82  (01/29 0520) Resp:  [13-19] 18  (01/29 0520) BP: (114-150)/(65-85) 114/65 mmHg (01/29 0520) SpO2:  [96 %-100 %] 98 % (01/29 0520)  Intake/Output from previous day: 01/28 0701 - 01/29 0700 In: 4086.5 [P.O.:940; I.V.:3146.5] Out: 1275 [Urine:1025] Intake/Output this shift:    Lab 02/06/12 1815 02/05/12 1106  WBC 16.5* 8.7  HGB 11.2* 11.9*  HCT 34.6* 37.2  PLT 367 396    No results found for this basename: NA:3,K:3,CL:3,CO2:3,BUN:3,CREATININE:3,CALCIUM:3,LABALBU:3,PROT:3,BILITOT:3,ALKPHOS:3,ALT:3,AST:3,GLUCOSE:3 in the last 168 hours  EXAM: General: alert, cooperative and no distress Resp: clear to auscultation bilaterally Cardio: regular rate and rhythm, S1, S2 normal, no murmur, click, rub or gallop GI: Bowel sounds present, soft, incsions intact without evidence of infection Extremities: No calf tenderness  Vaginal pad with faint pink stain   Assessment: s/p Procedure(s): LAPAROSCOPY OPERATIVE DILATATION & CURETTAGE/HYSTEROSCOPY WITH NOVASURE ABLATION CYSTOSCOPY OVARIAN CYSTECTOMY BILATERAL SALPINGECTOMY: stable and progressing well  Plan: Discharge home Heating pad to abdomen for gas pains  LOS: 1 day    POWELL,ELMIRA, PA-C 02/07/2012 9:16 AM  Agree with above.  D/c instructions reviewed.  Post up in 2wks.  Pt is voiding well, tolerating po, passing flatus and ambulating without difficulty.

## 2012-02-07 NOTE — Addendum Note (Signed)
Addendum  created 02/07/12 1610 by Graciela Husbands, CRNA   Modules edited:Notes Section

## 2012-02-07 NOTE — Progress Notes (Signed)
Pt is discharged in the care of family. Downtairs per wheelchair. Denies any pain or discomfort.Lapsites are clean and dry.Scanty to no vaginal bleeding on V_Pad. Discharge instructions were given to pt with good understanding.Questions were asked and answered. Stable.

## 2012-02-07 NOTE — Anesthesia Postprocedure Evaluation (Signed)
   Anesthesia Post-op Note  Patient: Alyssa Rangel     Anesthesia Type:General  Level of Consciousness: awake and alert.  Airway and Oxygen Therapy: Patient Spontanous Breathing   Post-op Pain: controlled and mild.  Post-op Assessment: Post-op Vital signs reviewed  Post-op Vital Signs: Reviewed and stable  Filed Vitals:   02/07/12 0520  BP: 114/65  Pulse: 82  Temp: 36.9 C  Resp: 18    Complications: No apparent anesthesia complications

## 2012-02-07 NOTE — Discharge Summary (Signed)
  Physician Discharge Summary  Patient ID: Alyssa Rangel MRN: [id number] DOB/AGE: [birth year] 47 y.o.  Admit date: 02/06/2012 Discharge date: 02/07/2012   Discharge Diagnoses:   Menorrhagia and Bilateral Dermoid Cysts Active Problems:  * No active hospital problems. *    Operation: Hysteroscopy, Dilatation, Curettage, Novasure Endometrial Ablation, Operative Laparoscopy with Bilateral Ovarian Cystectomy and  Partial Bilateral Salpingo-oophorectomy   Discharged Condition: Good  Hospital Course: On the date of admission the patient underwent the aforementioned procedures and tolerated them all  well.  By post operative day #1 she had resumed bowel and bladder function and was therefore deemed ready for discharge home.  Disposition: 01-Home or Self Care  Discharge Medications:   [name], [name]  Home Medication Instructions FAO:[id number]   Printed on:02/07/12 1053  Medication Information                    ondansetron (ZOFRAN) 4 MG tablet Take 1 tablet (4 mg total) by mouth every 8 (eight) hours as needed for nausea.           ibuprofen (ADVIL,MOTRIN) 600 MG tablet Take 1 tablet (600 mg total) by mouth every 6 (six) hours as needed (mild pain).           oxyCODONE-acetaminophen (PERCOCET/ROXICET) 5-325 MG per tablet Take 1-2 tablets by mouth every 4 (four) hours as needed (moderate to severe pain (when tolerating fluids)).              Follow-up: Dr.Kadan Millstein, February 19, 2013 at 11:15 a.m.   SignedHenreitta Leber, PA-C 02/07/2012, 10:53 AM

## 2012-02-20 ENCOUNTER — Encounter: Payer: 59 | Admitting: Obstetrics and Gynecology

## 2012-02-26 ENCOUNTER — Encounter: Payer: Self-pay | Admitting: Obstetrics and Gynecology

## 2012-02-26 ENCOUNTER — Ambulatory Visit: Payer: 59 | Admitting: Obstetrics and Gynecology

## 2012-02-26 VITALS — BP 104/60 | Temp 98.6°F | Resp 14 | Wt 222.0 lb

## 2012-02-26 DIAGNOSIS — R350 Frequency of micturition: Secondary | ICD-10-CM

## 2012-02-26 DIAGNOSIS — N838 Other noninflammatory disorders of ovary, fallopian tube and broad ligament: Secondary | ICD-10-CM

## 2012-02-26 DIAGNOSIS — N898 Other specified noninflammatory disorders of vagina: Secondary | ICD-10-CM

## 2012-02-26 LAB — POCT WET PREP (WET MOUNT)
Clue Cells Wet Prep Whiff POC: POSITIVE
Trichomonas Wet Prep HPF POC: POSITIVE

## 2012-02-26 MED ORDER — CEPHALEXIN 500 MG PO CAPS: 500.0000 mg | ORAL_CAPSULE | Freq: Two times a day (BID) | ORAL | Status: DC

## 2012-02-26 MED ORDER — METRONIDAZOLE 500 MG PO TABS: 2000.0000 mg | ORAL_TABLET | Freq: Once | ORAL | Status: DC

## 2012-02-26 MED ORDER — METOCLOPRAMIDE HCL 10 MG PO TABS: 10.0000 mg | ORAL_TABLET | Freq: Four times a day (QID) | ORAL | Status: DC

## 2012-02-26 NOTE — Addendum Note (Signed)
Addended by: Osborn Coho on: 02/26/2012 04:20 PM   Modules accepted: Orders

## 2012-02-26 NOTE — Progress Notes (Signed)
DATE OF SURGERY: 02-06-2012 TYPE OF SURGERY: LAPAROSCOPIC OVARIAN BILATERAL CYSTECTOMY  LAPAROSCOPIC PARTIAL SALPINGO OOPHORECTOMY  DILATATION AND CURETTAGE /HYSTEROSCOPY  ENDOMETRIAL ABLATION VIA NOVASURE  CYSTOSCOPY  PAIN:Yes pt believes there is some type of knot in surgery area that is not painful to touch.  VAG BLEEDING: no VAG DISCHARGE: no NORMAL GI FUNCTN: yes pt states she had to take a laxative after leaving the hospital, last BM was about x 1 week ago.  NORMAL GU FUNCTN: yes  C/o constipation since procedure but passinf flatus normally.  She is only eating though once a day.  She has nl BMs usually.  She also c/o occas nausea.  She has had two BMs that she took laxative to have since surgery.  Also c/o frequency and incontinence that was present prior to surgery but she did not inform me until after her surgery.  1. Dermoid cyst, bilateral ovarian - MATURE CYSTIC TERATOMA (DERMAL CYST). - NO ATYPIA OR MALIGNANCY. 2. Endometrium, curettage - POLYPOID FRAGMENTS OF DISORDERED BENIGN PROLIFERATIVE ENDOMETRIUM. - PLEASE SEE COMMENT. Cyst fluid and peritoneal fluid both benign  Filed Vitals:   02/26/12 1431  BP: 104/60  Temp: 98.6 F (37 C)  Resp: 14   ROS: noncontributory  Pelvic exam:  VULVA: normal appearing vulva with no masses, tenderness or lesions,  VAGINA: normal appearing vagina with normal color and discharge, no lesions, CERVIX: normal appearing cervix without discharge or lesions, thick white d/c UTERUS: uterus is normal size, shape, consistency and nontender,  ADNEXA: normal adnexa in size, nontender and no masses.  Abd incisions - healing well with slight redness at suprapubic incision, no knot is visible of palpable (pt is feeling scar tissue)  A/P AEX due Sept 2014 Benign findings on path reviewed Wet prep - +trich Rx flagyl Urine to cx Colace BID until nl BMs, if no improvement over next 2wks, notify MD Trial of reglan

## 2012-02-27 ENCOUNTER — Telehealth: Payer: Self-pay | Admitting: Obstetrics and Gynecology

## 2012-02-28 LAB — URINE CULTURE: Colony Count: 6000

## 2012-06-19 ENCOUNTER — Encounter: Payer: Self-pay | Admitting: Medical

## 2012-07-17 ENCOUNTER — Encounter: Payer: Self-pay | Admitting: Medical

## 2012-07-17 ENCOUNTER — Ambulatory Visit (INDEPENDENT_AMBULATORY_CARE_PROVIDER_SITE_OTHER): Payer: No Typology Code available for payment source | Admitting: Medical

## 2012-07-17 VITALS — BP 138/88 | HR 68 | Temp 97.5°F | Resp 20 | Ht 64.0 in | Wt 223.0 lb

## 2012-07-17 DIAGNOSIS — L83 Acanthosis nigricans: Secondary | ICD-10-CM

## 2012-07-17 DIAGNOSIS — M2550 Pain in unspecified joint: Secondary | ICD-10-CM

## 2012-07-17 DIAGNOSIS — E785 Hyperlipidemia, unspecified: Secondary | ICD-10-CM

## 2012-07-17 DIAGNOSIS — M2560 Stiffness of unspecified joint, not elsewhere classified: Secondary | ICD-10-CM

## 2012-07-17 DIAGNOSIS — Z23 Encounter for immunization: Secondary | ICD-10-CM

## 2012-07-17 DIAGNOSIS — H9193 Unspecified hearing loss, bilateral: Secondary | ICD-10-CM

## 2012-07-17 DIAGNOSIS — Z0000 Encounter for general adult medical examination without abnormal findings: Secondary | ICD-10-CM

## 2012-07-17 DIAGNOSIS — M2540 Effusion, unspecified joint: Secondary | ICD-10-CM

## 2012-07-17 DIAGNOSIS — Z1239 Encounter for other screening for malignant neoplasm of breast: Secondary | ICD-10-CM

## 2012-07-17 DIAGNOSIS — H579 Unspecified disorder of eye and adnexa: Secondary | ICD-10-CM

## 2012-07-17 LAB — CBC WITH DIFFERENTIAL/PLATELET
Basophils Absolute: 0 10*3/uL (ref 0.0–0.1)
Lymphocytes Relative: 40 % (ref 12–46)
Lymphs Abs: 2.6 10*3/uL (ref 0.7–4.0)
Neutrophils Relative %: 50 % (ref 43–77)
Platelets: 425 10*3/uL — ABNORMAL HIGH (ref 150–400)
RBC: 4.44 MIL/uL (ref 3.87–5.11)
WBC: 6.6 10*3/uL (ref 4.0–10.5)

## 2012-07-17 LAB — POCT URINALYSIS DIPSTICK
Ketones, UA: NEGATIVE
Leukocytes, UA: NEGATIVE
Nitrite, UA: NEGATIVE
Protein, UA: NEGATIVE
Urobilinogen, UA: NEGATIVE
pH, UA: 5

## 2012-07-17 LAB — COMPREHENSIVE METABOLIC PANEL
Albumin: 4.1 g/dL (ref 3.5–5.2)
CO2: 26 mEq/L (ref 19–32)
Calcium: 9.1 mg/dL (ref 8.4–10.5)
Chloride: 105 mEq/L (ref 96–112)
Glucose, Bld: 74 mg/dL (ref 70–99)
Potassium: 4.5 mEq/L (ref 3.5–5.3)
Sodium: 138 mEq/L (ref 135–145)
Total Protein: 7.2 g/dL (ref 6.0–8.3)

## 2012-07-17 LAB — LIPID PANEL
Cholesterol: 234 mg/dL — ABNORMAL HIGH (ref 0–200)
HDL: 34 mg/dL — ABNORMAL LOW (ref >39)
Total CHOL/HDL Ratio: 6.9 Ratio

## 2012-07-17 MED ORDER — DICLOFENAC SODIUM 75 MG PO TBEC: 75.0000 mg | DELAYED_RELEASE_TABLET | Freq: Two times a day (BID) | ORAL | Status: DC

## 2012-07-17 NOTE — Progress Notes (Signed)
Subjective:   HPI  Alyssa Rangel is a 47 y.o. female who presents for a complete physical.  New patient today referred by Delia Heady.  [facility]     Preventative care: Last ophthalmology visit:n/a Last dental visit:n/a Last colonoscopy:n/a Last mammogram:never Last gynecological exam:10/2011 Last EKG:few years ago Last labs:n/a  Prior vaccinations: TD or Tdap:unsure Influenza:02/07/12 Pneumococcal:02/07/2012 Shingles/Zostavax:n/a  Advanced directive:n/a Health care power of attorney:n/a Living will:n/a  Concerns: Hx/o RA, reportedly diagnosed by prior PCM in 2008, but no prior rheumatology eval or treatment other than NSAIDs.  Main c/o is ongoing pains in hands, fingers, knees, right hip, and low back.  Has been on Percocet and NSAIDs prior.  Requests pain medication.     Has rash of left foot for month  No prior mammogram.  She reports problems with vision and hearing.  Reviewed their medical, surgical, family, social, medication, and allergy history and updated chart as appropriate.   Past Medical History  Diagnosis Date  . Hip pain     pt states no cartilage in right hip very painful  . Hearing loss   . Routine gynecological examination     Dr. Osborn Coho, [facility]  . Hypertension     due to diet pills in the past, never on meds; normotensive as of 7/14  . Hypercholesterolemia     prior medication therapy  . Rheumatoid arthritis(714.0)     patient reported history  . 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 Polyarthralgia     Past Surgical History  Procedure Laterality Date  . Laparoscopy  02/06/2012    Procedure: LAPAROSCOPY OPERATIVE;  Surgeon: Purcell Nails, MD;  Location: [facility];  Service: Gynecology;  Laterality: N/A;  . Dilitation & currettage/hystroscopy with novasure ablation  02/06/2012    Procedure: DILATATION & CURETTAGE/HYSTEROSCOPY WITH NOVASURE  ABLATION;  Surgeon: Purcell Nails, MD;  Location: [facility];  Service: Gynecology;  Laterality: N/A;   novasure  . Cystoscopy  02/06/2012    Procedure: CYSTOSCOPY;  Surgeon: Purcell Nails, MD;  Location: [facility];  Service: Gynecology;  Laterality: N/A;  . Ovarian cyst removal  02/06/2012    Procedure: OVARIAN CYSTECTOMY;  Surgeon: Purcell Nails, MD;  Location: [facility];  Service: Gynecology;  Laterality: Bilateral;  . Bilateral salpingectomy  02/06/2012    Procedure: BILATERAL SALPINGECTOMY;  Surgeon: Purcell Nails, MD;  Location: [facility];  Service: Gynecology;  Laterality: Bilateral;  partial bilateral salpingectomy  . Abdominal hysterectomy      Family History  Problem Relation Age of Onset  . Heart disease Maternal Grandmother   . Diabetes Maternal Grandmother   . Hypertension Maternal Grandmother   . Arthritis Maternal Grandmother   . Diabetes Mother     insulin dependent  . Hypertension Mother   . Other Father     unknown  . Multiple sclerosis Sister   . Cancer Neg Hx   . Stroke Neg Hx     History   Social History  . Marital Status: Single    Spouse Name: N/A    Number of Children: N/A  . Years of Education: N/A   Occupational History  . Not on file.   Social History Main Topics  . Smoking status: Former Smoker    Types: Cigarettes  . Smokeless tobacco: Never Used  . Alcohol Use: 0.6 oz/week    1 Glasses of wine per week  .  Drug Use: No  . Sexually Active: Not Currently    Birth Control/ Protection: None   Other Topics Concern  . Not on file   Social History Narrative   Single, no prior pregnancy, lives with mother, exercise - active on the job, walking, CNA at [employer]    Current Outpatient Prescriptions on File Prior to Visit  Medication Sig Dispense Refill  . ibuprofen (ADVIL,MOTRIN) 600 MG tablet Take 1 tablet (600 mg total) by mouth every 6 (six) hours as needed (mild pain).  30 tablet  1   No current facility-administered medications on file  prior to visit.    No Known Allergies   Review of Systems Constitutional: -fever, -chills, -sweats, -unexpected weight change, -decreased appetite, -fatigue Allergy: -sneezing, -itching, -congestion Dermatology: -changing moles, +rash, -lumps ENT: -runny nose, -ear pain, -sore throat, -hoarseness, -sinus pain, -teeth pain, - ringing in ears, -hearing loss, -nosebleeds Cardiology: -chest pain, -palpitations, -swelling, -difficulty breathing when lying flat, -waking up short of breath Respiratory: -cough, -shortness of breath, -difficulty breathing with exercise or exertion, -wheezing, -coughing up blood Gastroenterology: -abdominal pain, -nausea, -vomiting, -diarrhea, -constipation, -blood in stool, -changes in bowel movement, +difficulty swallowing or eating Hematology: -bleeding, -bruising  Musculoskeletal: +joint aches, +muscle aches, -joint swelling, +back pain, -neck pain, -cramping, -changes in gait Ophthalmology: denies vision changes, eye redness, itching, discharge Urology: -burning with urination, -difficulty urinating, -blood in urine, -urinary frequency, -urgency, -incontinence, +unable to hold urine Neurology: -headache, -weakness, -tingling, -numbness, -memory loss, -falls, -dizziness,  Psychology: -depressed mood, -agitation, +sleep problems     Objective:   Physical Exam  Reviewed nurse notes and vitals  General appearance: alert, no distress, WD/WN, AA female Skin:  Left medial foot anterior heel region with round 5cm patch of slightly raised and rough, brown and somewhat crusted lesion, thickened skin HEENT: normocephalic, conjunctiva/corneas normal, sclerae anicteric, PERRLA, EOMi, nares patent, no discharge or erythema, pharynx normal Oral cavity: MMM, tongue normal, teeth with moderate plaque Neck: supple, no lymphadenopathy, no thyromegaly, no masses, normal ROM Chest: non tender, normal shape and expansion Heart: RRR, normal S1, S2, no murmurs Lungs: CTA  bilaterally, no wheezes, rhonchi, or rales Abdomen: +bs, soft, lower abdominal surgical port hole scars, non tender, non distended, no masses, no hepatomegaly, no splenomegaly, no bruits Back: non tender, normal ROM, no scoliosis Musculoskeletal: right hip with reduced ROM and pain, both external and internal ROM, mild tenderness of right knee joint line, otherwise upper extremities non tender, no obvious deformity, normal ROM throughout, lower extremities non tender, no obvious deformity, normal ROM throughout Extremities: no edema, no cyanosis, no clubbing Pulses: 2+ symmetric, upper and lower extremities, normal cap refill Neurological: alert, oriented x 3, CN2-12 intact, strength normal upper extremities and lower extremities, sensation normal throughout, DTRs 2+ throughout, no cerebellar signs, gait normal Psychiatric: normal affect, behavior normal, pleasant  Breast/gyn/rectal - deferred to gynecology   Assessment and Plan :    Encounter Diagnoses  Name Primary?  . Routine general medical examination at a health care facility Yes  . Need for Tdap vaccination   . Hyperlipidemia   . Screening for breast cancer   . Hearing loss of both ears   . Abnormal vision screen   . Polyarthralgia   . Morning joint stiffness   . Joint swelling   . Acanthosis nigricans     Physical exam - discussed healthy lifestyle, diet, exercise, preventative care, vaccinations, and addressed their concerns.  Handout given. tdap vaccine, VIS and counseling given Hyperlipidemia - labs  today, not on medication Referral for mammogram C/o Hearing loss - hearing screen normal today Abnormal vision screen - gave contact info for eye doctor so she can schedule baseline evaluation Polyarthralgia, joint swelling, joint stiffness - rheumatology screen today, referral back to Dr. Despina Hick for right hip problems Acanthosis migraines noted - f/u pending labs  Follow-up pending labs, studies

## 2012-07-17 NOTE — Patient Instructions (Signed)
It was nice to meet you today.   I recommend you make appointment with both a dentist and an eye doctor.  Here are some choices:  EYE DOCTORS:  Ophthalmology Alyssa Rangel [address] Alyss[place] Rangel, [place] [address] [phone]   [facility] Alyssa Rangel [address], [place] [place], [place] [address]  [phone] [email or web address]   Alyssa Rangel, M.D. Alyssa Rangel, O.D. [address] [place], [place] [address] Medical telephone: [phone] Optical telephone: [phone]   DENTIST:  Alyssa Rangel, dentist [address] [place], [place] [address] [phone] [email or web address]   Begin Diclofenac 1-2 times daily with food to help with arthritis/pains.  We are checking a rheumatology screen today.   We will refer you back to Dr. Despina Hick to recheck on the right hip.    Go for your mammogram.   We updated your Tdap vaccine today, this is good for 10 years.  We will call with labs results and plan.

## 2012-07-18 LAB — HEMOGLOBIN A1C: Mean Plasma Glucose: 126 mg/dL — ABNORMAL HIGH (ref <117)

## 2012-07-18 LAB — RHEUMATOID FACTOR: Rhuematoid fact SerPl-aCnc: <10 IU/mL (ref <=14)

## 2012-07-18 LAB — ANA: Anti Nuclear Antibody(ANA): NEGATIVE

## 2012-07-19 ENCOUNTER — Telehealth: Payer: Self-pay | Admitting: Family Medicine

## 2012-07-19 ENCOUNTER — Other Ambulatory Visit: Payer: Self-pay | Admitting: Medical

## 2012-07-19 LAB — CYCLIC CITRUL PEPTIDE ANTIBODY, IGG: Cyclic Citrullin Peptide Ab: <2 U/mL (ref 0.0–5.0)

## 2012-07-19 MED ORDER — ATORVASTATIN CALCIUM 40 MG PO TABS: 40.0000 mg | ORAL_TABLET | Freq: Every day | ORAL | Status: DC

## 2012-07-19 NOTE — Telephone Encounter (Signed)
Patient is aware of her appointment to see Dr. Despina Hick on August 19, 2012 @ 115 pm. CLS [facility]. [phone]

## 2012-08-02 ENCOUNTER — Ambulatory Visit: Payer: Self-pay

## 2012-08-06 ENCOUNTER — Telehealth: Payer: Self-pay | Admitting: Medical

## 2012-08-06 ENCOUNTER — Other Ambulatory Visit: Payer: Self-pay | Admitting: Medical

## 2012-08-06 MED ORDER — MELOXICAM 15 MG PO TABS: 15.0000 mg | ORAL_TABLET | Freq: Every day | ORAL | Status: DC

## 2012-08-06 NOTE — Telephone Encounter (Signed)
Lets try Meloxicam once daily in the morning instead of Diclofenac.  She can take tylenol arthritis OTC in the evenings.  Thus, this would give her twice daily medication to help with pain.       F/u in late August as planned fasting regarding cholesterol

## 2012-08-07 NOTE — Telephone Encounter (Signed)
CALLED PT TO GIVE INSTUCTIONS AND MADE APPT IN [place]

## 2012-08-08 ENCOUNTER — Ambulatory Visit
Admission: RE | Admit: 2012-08-08 | Discharge: 2012-08-08 | Disposition: A | Discharge status: Home / Self Care | Payer: No Typology Code available for payment source | Source: Ambulatory Visit | Attending: Obstetrics and Gynecology | Admitting: Obstetrics and Gynecology

## 2012-08-08 DIAGNOSIS — Z1231 Encounter for screening mammogram for malignant neoplasm of breast: Secondary | ICD-10-CM

## 2012-08-26 ENCOUNTER — Telehealth: Payer: Self-pay | Admitting: Family Medicine

## 2012-08-26 NOTE — Telephone Encounter (Signed)
Patient is aware AND SHE HAS AN APPOINTMENT ON 09/06/12. CLS

## 2012-08-26 NOTE — Telephone Encounter (Signed)
Message copied by Janeice Robinson on Mon Aug 26, 2012  9:27 AM ------      Message from: Jac Canavan      Created: Fri Aug 23, 2012  5:18 PM       Have her return for visit recheck for surgery clearance.  I will need and EKG, and possibly other eval.   ------

## 2012-08-28 ENCOUNTER — Encounter: Payer: Self-pay | Admitting: Internal Medicine

## 2012-09-06 ENCOUNTER — Encounter: Payer: Self-pay | Admitting: Medical

## 2012-09-06 ENCOUNTER — Ambulatory Visit (INDEPENDENT_AMBULATORY_CARE_PROVIDER_SITE_OTHER): Payer: No Typology Code available for payment source | Admitting: Medical

## 2012-09-06 VITALS — BP 100/70 | HR 92 | Temp 97.9°F | Resp 18 | Wt 220.0 lb

## 2012-09-06 DIAGNOSIS — R911 Solitary pulmonary nodule: Secondary | ICD-10-CM

## 2012-09-06 DIAGNOSIS — R7301 Impaired fasting glucose: Secondary | ICD-10-CM

## 2012-09-06 DIAGNOSIS — D649 Anemia, unspecified: Secondary | ICD-10-CM

## 2012-09-06 DIAGNOSIS — N939 Abnormal uterine and vaginal bleeding, unspecified: Secondary | ICD-10-CM

## 2012-09-06 DIAGNOSIS — E785 Hyperlipidemia, unspecified: Secondary | ICD-10-CM

## 2012-09-06 DIAGNOSIS — N898 Other specified noninflammatory disorders of vagina: Secondary | ICD-10-CM

## 2012-09-06 LAB — POCT URINALYSIS DIPSTICK
Bilirubin, UA: NEGATIVE
Ketones, UA: NEGATIVE
Spec Grav, UA: 1.025
pH, UA: 5

## 2012-09-06 NOTE — Progress Notes (Signed)
Subjective:  Alyssa Rangel is a 47 y.o. female who presents for recheck.  I saw her as a new patient 07/2012.  She is here on f/u on issues and labs.   She was found to have high cholesterol LDL and low HDL.   She did start Lipitor.  Compliance with treatment has been excellent. The patient exercises intermittently.   Patient denies muscle pain associated with female medications.  Patient's cardiac risk factors are: dyslipidemia and obesity (BMI >= 30 kg/m2).   Her labs show anemia, but she denies hx/o anemia.  She denies blood in stool.  She is s/p hysterectomy.  She does however note recently with vaginal bleeding intermittent.  Denies blood in urine.  Denies bleeding with intercourse.  She quit tobacco few months ago, but has smoked on average 1/2 ppd since age 52yo.    She had diabetes in the family and knows about her elevated glucose.   She is working to make diet changes.    She will be having hip surgery soon.     No other c/o.  The following portions of the patient's history were reviewed and updated as appropriate: allergies, current medications, past family history, past medical history, past social history, past surgical history and problem list.  ROS Otherwise as in subjective above  Objective: Physical Exam  Vital signs reviewed  General appearance: alert, no distress, WD/WN Heart: RRR, normal S1, S2, no murmurs Lungs: CTA bilaterally, no wheezes, rhonchi, or rales Abdomen: +bs, soft, non tender, non distended, no masses, no hepatomegaly, no splenomegaly Pulses: 2+ radial pulses, 2+ pedal pulses, normal cap refill Ext: no edema   Assessment: Encounter Diagnoses  Name Primary?  . Hyperlipidemia Yes  . Impaired fasting blood sugar   . Pulmonary nodule, right   . Vaginal bleeding   . Anemia      Plan: Hyperlipidemia - compliant with medication.  Labs today. Impaired fasting glucose - discussed diet recommendations, her risks for diabetes.  pulm nodule  - incidental finding on CT abdomen/pelvis 09/2011.  Given her smoking hx/o, we should get a Chest CT, but timing will be delayed til after her hip surgery at her convenience.  I did congratulate her on stopping tobacco. Vaginal bleeding - she will f/u with her gynecologist soon regarding this Anemia - mild.  She will return stool cards x 3.  Follow up: pending labs, after hip surgery regarding small pulmonary nodules.

## 2012-09-07 LAB — LIPID PANEL
Cholesterol: 191 mg/dL (ref 0–200)
HDL: 35 mg/dL — ABNORMAL LOW (ref >39)
Total CHOL/HDL Ratio: 5.5 Ratio
VLDL: 19 mg/dL (ref 0–40)

## 2012-09-07 LAB — ALT: ALT: 18 U/L (ref 0–35)

## 2012-09-11 ENCOUNTER — Other Ambulatory Visit: Payer: Self-pay | Admitting: Medical

## 2012-09-11 MED ORDER — ROSUVASTATIN CALCIUM 20 MG PO TABS: 20.0000 mg | ORAL_TABLET | Freq: Every day | ORAL | Status: DC

## 2012-09-13 ENCOUNTER — Telehealth: Payer: Self-pay | Admitting: Medical

## 2012-09-13 NOTE — Telephone Encounter (Signed)
pls complete what you can of handicap form and i'll do my part and sign

## 2012-09-13 NOTE — Telephone Encounter (Signed)
Pt came in to pick up samples and requested a parking placard be filled out for her because of upcoming surgery. I am sending a form for her back in your folder. Call when ready

## 2012-09-16 NOTE — Telephone Encounter (Signed)
Patient is aware and she will come by and fill out the form and pick up. CLS

## 2012-09-19 ENCOUNTER — Other Ambulatory Visit: Payer: Self-pay | Admitting: Orthopedic Surgery

## 2012-09-19 NOTE — Progress Notes (Signed)
Preoperative surgical orders have been place into the Epic hospital system for Alyssa Rangel on 09/19/2012, 4:10 PM  by Patrica Duel for surgery on 10/09/12.  Preop Total Hip orders including Experel Injecion, PO Tylenol, and IV Decadron as long as there are no contraindications to the above medications. Avel Peace, PA-C

## 2012-09-27 ENCOUNTER — Encounter (HOSPITAL_COMMUNITY): Payer: Self-pay | Admitting: Pharmacy Technician

## 2012-10-01 ENCOUNTER — Encounter (HOSPITAL_COMMUNITY)
Admission: RE | Admit: 2012-10-01 | Discharge: 2012-10-01 | Disposition: A | Discharge status: Home / Self Care | Payer: 59 | Source: Ambulatory Visit | Attending: Orthopedic Surgery | Admitting: Orthopedic Surgery

## 2012-10-01 ENCOUNTER — Encounter (HOSPITAL_COMMUNITY): Payer: Self-pay

## 2012-10-01 ENCOUNTER — Ambulatory Visit (HOSPITAL_COMMUNITY)
Admission: RE | Admit: 2012-10-01 | Discharge: 2012-10-01 | Disposition: A | Discharge status: Home / Self Care | Payer: 59 | Source: Ambulatory Visit | Attending: Orthopedic Surgery | Admitting: Orthopedic Surgery

## 2012-10-01 DIAGNOSIS — M1610 Unilateral primary osteoarthritis, unspecified hip: Secondary | ICD-10-CM | POA: Insufficient documentation

## 2012-10-01 DIAGNOSIS — Z01818 Encounter for other preprocedural examination: Secondary | ICD-10-CM | POA: Insufficient documentation

## 2012-10-01 DIAGNOSIS — Z01812 Encounter for preprocedural laboratory examination: Secondary | ICD-10-CM | POA: Insufficient documentation

## 2012-10-01 DIAGNOSIS — M47817 Spondylosis without myelopathy or radiculopathy, lumbosacral region: Secondary | ICD-10-CM | POA: Insufficient documentation

## 2012-10-01 DIAGNOSIS — M169 Osteoarthritis of hip, unspecified: Secondary | ICD-10-CM | POA: Insufficient documentation

## 2012-10-01 HISTORY — DX: Other specified postprocedural states: Z98.890

## 2012-10-01 HISTORY — DX: Anemia, unspecified: D64.9

## 2012-10-01 LAB — COMPREHENSIVE METABOLIC PANEL
ALT: 17 U/L (ref 0–35)
AST: 12 U/L (ref 0–37)
Albumin: 3.5 g/dL (ref 3.5–5.2)
Alkaline Phosphatase: 79 U/L (ref 39–117)
BUN: 11 mg/dL (ref 6–23)
Calcium: 9.5 mg/dL (ref 8.4–10.5)
Chloride: 102 mEq/L (ref 96–112)
Glucose, Bld: 93 mg/dL (ref 70–99)
Potassium: 4.1 mEq/L (ref 3.5–5.1)
Sodium: 136 mEq/L (ref 135–145)
Total Bilirubin: 0.6 mg/dL (ref 0.3–1.2)
Total Protein: 7.4 g/dL (ref 6.0–8.3)

## 2012-10-01 LAB — CBC
HCT: 39 % (ref 36.0–46.0)
Hemoglobin: 12.5 g/dL (ref 12.0–15.0)
MCHC: 32.1 g/dL (ref 30.0–36.0)
Platelets: 326 10*3/uL (ref 150–400)
RDW: 13.4 % (ref 11.5–15.5)
WBC: 8.3 10*3/uL (ref 4.0–10.5)

## 2012-10-01 LAB — URINE MICROSCOPIC-ADD ON

## 2012-10-01 LAB — URINALYSIS, ROUTINE W REFLEX MICROSCOPIC
Bilirubin Urine: NEGATIVE
Glucose, UA: NEGATIVE mg/dL
Hgb urine dipstick: NEGATIVE
Nitrite: NEGATIVE
Specific Gravity, Urine: 1.026 (ref 1.005–1.030)
Urobilinogen, UA: 1 mg/dL (ref 0.0–1.0)
pH: 5 (ref 5.0–8.0)

## 2012-10-01 LAB — SURGICAL PCR SCREEN: Staphylococcus aureus: NEGATIVE

## 2012-10-01 LAB — PROTIME-INR
INR: 1.03 (ref 0.00–1.49)
Prothrombin Time: 13.3 seconds (ref 11.6–15.2)

## 2012-10-01 LAB — ABO/RH: ABO/RH(D): A POS

## 2012-10-01 LAB — APTT: aPTT: 28 seconds (ref 24–37)

## 2012-10-01 NOTE — Progress Notes (Signed)
10-01-12 1310 Labs viewable in Epic, note urinalysis.

## 2012-10-01 NOTE — Patient Instructions (Addendum)
20 Alyssa Rangel  10/01/2012   Your procedure is scheduled on: 10-1  -2014  Report to [facility] at    1:00    PM.  Call this number if you have problems the morning of surgery: [phone]  Or Presurgical Testing [phone](Gilford Lardizabal)     Do not eat food:After Midnight.  May have clear liquids:up to 6 Hours before arrival. Nothing after : 1000  Clear liquids include soda, tea, black coffee, apple or grape juice, broth.  Take these medicines the morning of surgery with A SIP OF WATER: Crestor. Tramadol. Norco if needed.   Do not wear jewelry, make-up or nail polish.  Do not wear lotions, powders, or perfumes. You may wear deodorant.  Do not shave 12 hours prior to first CHG shower(legs and under arms).(face and neck okay.)  Do not bring valuables to the hospital.  Contacts, dentures or bridgework,body piercing,  may not be worn into surgery.  Leave suitcase in the car. After surgery it may be brought to your room.  For patients admitted to the hospital, checkout time is 11:00 AM the day of discharge.   Patients discharged the day of surgery will not be allowed to drive home. Must have responsible person with you x 24 hours once discharged.  Name and phone number of your driver: [name], mother -[phone]/ [phone] cell  Special Instructions: CHG(Chlorhedine 4%-"Hibiclens","Betasept","Aplicare") Shower Use Special Wash: see special instructions.(avoid face and genitals)   Please read over the following fact sheets that you were given: MRSA Information, Blood Transfusion fact sheet, Incentive Spirometry Instruction.    Failure to follow these instructions may result in Cancellation of your surgery.   Patient signature_______________________________________________________

## 2012-10-01 NOTE — Pre-Procedure Instructions (Addendum)
10-01-12 CT Chest 5'14. RT. Hip XR today. 10-01-12 1310 Labs viewable in Epic faxed to Dr. Deri Fuelling office. [name]

## 2012-10-02 LAB — URINE CULTURE: Culture: NO GROWTH

## 2012-10-08 ENCOUNTER — Other Ambulatory Visit: Payer: Self-pay | Admitting: Surgical

## 2012-10-08 NOTE — H&P (Signed)
TOTAL HIP ADMISSION H&P  Patient is admitted for right total hip arthroplasty.  Subjective:  Chief Complaint: right hip pain  HPI: Alyssa Rangel, 47 y.o. female, has a history of pain and functional disability in the right hip(s) due to arthritis and patient has failed non-surgical conservative treatments for greater than 12 weeks to include NSAID's and/or analgesics, use of assistive devices, weight reduction as appropriate and activity modification.  Onset of symptoms was gradual starting 6 years ago with gradually worsening course since that time.The patient noted no past surgery on the right hip(s).  Patient currently rates pain in the right hip at 8 out of 10 with activity. Patient has night pain, worsening of pain with activity and weight bearing, pain that interfers with activities of daily living, pain with passive range of motion and crepitus. Patient has evidence of periarticular osteophytes, joint space narrowing and a retroverted femoral neck by imaging studies. This condition presents safety issues increasing the risk of falls. There is no current active infection.  Patient Active Problem List   Diagnosis Date Noted  . Menorrhagia 10/30/2011  . 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 Hypercholesterolemia   . Rheumatoid arthritis    Past Medical History  Diagnosis Date  . Hip pain     pt states no cartilage in right hip very painful  . Hearing loss   . Routine gynecological examination     Dr. Osborn Coho, [facility]  . Hypertension     due to diet pills in the past, never on meds; normotensive as of 7/14  . Hypercholesterolemia     prior medication therapy  . Rheumatoid arthritis(714.0)     patient reported history  . 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 Polyarthralgia   . Anemia   . History of body piercing     tongue ring    Past Surgical History   Procedure Laterality Date  . Laparoscopy  02/06/2012    Procedure: LAPAROSCOPY OPERATIVE;  Surgeon: Purcell Nails, MD;  Location: [facility];  Service: Gynecology;  Laterality: N/A;  . Dilitation & currettage/hystroscopy with novasure ablation  02/06/2012    Procedure: DILATATION & CURETTAGE/HYSTEROSCOPY WITH NOVASURE ABLATION;  Surgeon: Purcell Nails, MD;  Location: [facility];  Service: Gynecology;  Laterality: N/A;   novasure  . Cystoscopy  02/06/2012    Procedure: CYSTOSCOPY;  Surgeon: Purcell Nails, MD;  Location: [facility];  Service: Gynecology;  Laterality: N/A;  . Ovarian cyst removal  02/06/2012    Procedure: OVARIAN CYSTECTOMY;  Surgeon: Purcell Nails, MD;  Location: [facility];  Service: Gynecology;  Laterality: Bilateral;  . Bilateral salpingectomy  02/06/2012    Procedure: BILATERAL SALPINGECTOMY;  Surgeon: Purcell Nails, MD;  Location: [facility];  Service: Gynecology;  Laterality: Bilateral;  partial bilateral salpingectomy     Current outpatient prescriptions: Atorvastatin Calcium 40mg  1 tablet PO QD Hydrocodone-Acetaminophen 5-325mg  1-2 tablets q4-6h PRN pain  No Known Allergies  History  Substance Use Topics  . Smoking status: Former Smoker -- 1.00 packs/day for 25 years    Types: Cigarettes    Quit date: 10/02/2011  . Smokeless tobacco: Never Used  . Alcohol Use: 0.6 oz/week    1 Glasses of wine per week     Comment: occ.    Family History  Problem Relation Age of Onset  . Heart disease Maternal Grandmother   .  Diabetes Maternal Grandmother   . Hypertension Maternal Grandmother   . Arthritis Maternal Grandmother   . Diabetes Mother     insulin dependent  . Hypertension Mother   . Other Father     unknown  . Multiple sclerosis Sister   . Cancer Neg Hx   . Stroke Neg Hx      Review of Systems  Constitutional: Negative.   HENT: Negative.  Negative for neck pain.   Eyes: Positive for blurred vision. Negative for double vision, photophobia, pain, discharge and  redness.  Respiratory: Negative.   Cardiovascular: Negative.   Gastrointestinal: Negative.   Genitourinary: Negative.   Musculoskeletal: Positive for joint pain. Negative for myalgias, back pain and falls.       Right hip pain  Skin: Negative.   Neurological: Negative.   Endo/Heme/Allergies: Negative.   Psychiatric/Behavioral: Negative.     Objective:  Physical Exam  Constitutional: She is oriented to person, place, and time. She appears well-developed and well-nourished. No distress.  HENT:  Head: Normocephalic and atraumatic.  Right Ear: External ear normal.  Left Ear: External ear normal.  Nose: Nose normal.  Mouth/Throat: Oropharynx is clear and moist.  Eyes: Conjunctivae and EOM are normal.  Neck: Normal range of motion. Neck supple.  Cardiovascular: Normal rate, normal heart sounds and intact distal pulses.   No murmur heard. Respiratory: Effort normal and breath sounds normal. No respiratory distress. She has no wheezes.  GI: Soft. Bowel sounds are normal. She exhibits no distension. There is no tenderness.  Musculoskeletal:       Right hip: She exhibits decreased range of motion and decreased strength.       Left hip: Normal.       Right knee: Normal.       Left knee: Normal.       Right lower leg: She exhibits no tenderness and no swelling.       Left lower leg: She exhibits no tenderness and no swelling.  Evaluation of her right hip flexion 70, no internal or external rotation, only about 10 to 20 degrees of abduction. She has significantly antalgic gait pattern. Her left hip is normal. She is about one inch short on the right compared to the left.  Neurological: She is alert and oriented to person, place, and time. She has normal strength and normal reflexes. No sensory deficit.  Skin: No rash noted. She is not diaphoretic. No erythema.  Psychiatric: She has a normal mood and affect. Her behavior is normal.    Vitals Weight: 224 lb Height: 64 in Body Surface  Area: 2.14 m Body Mass Index: 38.45 kg/m Pulse: 84 (Regular) BP: 128/84 (Sitting, Left Arm, Standard)  Imaging Review Plain radiographs demonstrate severe degenerative joint disease of the right hip(s). The bone quality appears to be fair for age and reported activity level.  Assessment/Plan:  End stage arthritis, right hip(s)  The patient history, physical examination, clinical judgement of the provider and imaging studies are consistent with end stage degenerative joint disease of the right hip(s) and total hip arthroplasty is deemed medically necessary. The treatment options including medical management, injection therapy, arthroscopy and arthroplasty were discussed at length. The risks and benefits of total hip arthroplasty were presented and reviewed. The risks due to aseptic loosening, infection, stiffness, dislocation/subluxation,  thromboembolic complications and other imponderables were discussed.  The patient acknowledged the explanation, agreed to proceed with the plan and consent was signed. Patient is being admitted for inpatient treatment for surgery, pain  control, PT, OT, prophylactic antibiotics, VTE prophylaxis, progressive ambulation and ADL's and discharge planning.The patient is planning to be discharged home with home health services     [place], [place]

## 2012-10-09 ENCOUNTER — Encounter (HOSPITAL_COMMUNITY): Payer: Self-pay | Admitting: *Deleted

## 2012-10-09 ENCOUNTER — Encounter (HOSPITAL_COMMUNITY): Payer: Self-pay | Admitting: Anesthesiology

## 2012-10-09 ENCOUNTER — Inpatient Hospital Stay (HOSPITAL_COMMUNITY): Payer: 59 | Admitting: Anesthesiology

## 2012-10-09 ENCOUNTER — Encounter (HOSPITAL_COMMUNITY)
Admission: RE | Disposition: A | Discharge status: Home Health | Payer: Self-pay | Source: Ambulatory Visit | Attending: Orthopedic Surgery

## 2012-10-09 ENCOUNTER — Inpatient Hospital Stay (HOSPITAL_COMMUNITY): Payer: 59

## 2012-10-09 ENCOUNTER — Inpatient Hospital Stay (HOSPITAL_COMMUNITY)
Admission: RE | Admit: 2012-10-09 | Discharge: 2012-10-14 | DRG: 470 | Disposition: A | Discharge status: Home Health | Payer: 59 | Source: Ambulatory Visit | Attending: Orthopedic Surgery | Admitting: Orthopedic Surgery

## 2012-10-09 DIAGNOSIS — R42 Dizziness and giddiness: Secondary | ICD-10-CM | POA: Diagnosis not present

## 2012-10-09 DIAGNOSIS — H9190 Unspecified hearing loss, unspecified ear: Secondary | ICD-10-CM | POA: Diagnosis present

## 2012-10-09 DIAGNOSIS — Z6838 Body mass index (BMI) 38.0-38.9, adult: Secondary | ICD-10-CM

## 2012-10-09 DIAGNOSIS — Z96649 Presence of unspecified artificial hip joint: Secondary | ICD-10-CM

## 2012-10-09 DIAGNOSIS — M1610 Unilateral primary osteoarthritis, unspecified hip: Principal | ICD-10-CM | POA: Diagnosis present

## 2012-10-09 DIAGNOSIS — M169 Osteoarthritis of hip, unspecified: Secondary | ICD-10-CM | POA: Diagnosis present

## 2012-10-09 DIAGNOSIS — E871 Hypo-osmolality and hyponatremia: Secondary | ICD-10-CM | POA: Diagnosis not present

## 2012-10-09 HISTORY — PX: TOTAL HIP ARTHROPLASTY: SHX124

## 2012-10-09 LAB — TYPE AND SCREEN
ABO/RH(D): A POS
Antibody Screen: NEGATIVE

## 2012-10-09 SURGERY — ARTHROPLASTY, HIP, TOTAL,POSTERIOR APPROACH
Anesthesia: General | Site: Hip | Laterality: Right | Wound class: Clean

## 2012-10-09 MED ORDER — MORPHINE SULFATE 10 MG/ML IJ SOLN: 1.0000 mg | INTRAMUSCULAR | Status: DC | PRN

## 2012-10-09 MED ORDER — SODIUM CHLORIDE 0.9 % IV SOLN: INTRAVENOUS | Status: DC

## 2012-10-09 MED ORDER — BUPIVACAINE HCL (PF) 0.25 % IJ SOLN
INTRAMUSCULAR | Status: AC
  Filled 2012-10-09: qty 30

## 2012-10-09 MED ORDER — FLEET ENEMA 7-19 GM/118ML RE ENEM: 1.0000 | ENEMA | Freq: Once | RECTAL | Status: AC | PRN

## 2012-10-09 MED ORDER — KETOROLAC TROMETHAMINE 15 MG/ML IJ SOLN
15.0000 mg | Freq: Four times a day (QID) | INTRAMUSCULAR | Status: AC | PRN
  Administered 2012-10-09: 21:00:00 15 mg via INTRAVENOUS
  Filled 2012-10-09: qty 1

## 2012-10-09 MED ORDER — STERILE WATER FOR IRRIGATION IR SOLN
Status: DC | PRN
  Administered 2012-10-09: 3000 mL

## 2012-10-09 MED ORDER — ONDANSETRON HCL 4 MG/2ML IJ SOLN
INTRAMUSCULAR | Status: DC | PRN
  Administered 2012-10-09: 4 mg via INTRAVENOUS

## 2012-10-09 MED ORDER — DEXAMETHASONE SODIUM PHOSPHATE 10 MG/ML IJ SOLN
INTRAMUSCULAR | Status: DC | PRN
  Administered 2012-10-09: 10 mg via INTRAVENOUS

## 2012-10-09 MED ORDER — CEFAZOLIN SODIUM-DEXTROSE 2-3 GM-% IV SOLR
2.0000 g | INTRAVENOUS | Status: AC
  Administered 2012-10-09: 2 g via INTRAVENOUS

## 2012-10-09 MED ORDER — DEXTROSE-NACL 5-0.9 % IV SOLN
INTRAVENOUS | Status: DC
  Administered 2012-10-10: 02:00:00 via INTRAVENOUS

## 2012-10-09 MED ORDER — BISACODYL 10 MG RE SUPP
10.0000 mg | Freq: Every day | RECTAL | Status: DC | PRN
  Filled 2012-10-09: qty 1

## 2012-10-09 MED ORDER — SODIUM CHLORIDE 0.9 % IJ SOLN
INTRAMUSCULAR | Status: AC
  Filled 2012-10-09: qty 50

## 2012-10-09 MED ORDER — TRAMADOL HCL 50 MG PO TABS
50.0000 mg | ORAL_TABLET | Freq: Four times a day (QID) | ORAL | Status: DC | PRN
  Administered 2012-10-11 – 2012-10-13 (×5): 100 mg via ORAL
  Filled 2012-10-09 (×6): qty 2

## 2012-10-09 MED ORDER — 0.9 % SODIUM CHLORIDE (POUR BTL) OPTIME
TOPICAL | Status: DC | PRN
  Administered 2012-10-09: 1000 mL

## 2012-10-09 MED ORDER — MIDAZOLAM HCL 2 MG/2ML IJ SOLN
INTRAMUSCULAR | Status: AC
  Filled 2012-10-09: qty 2

## 2012-10-09 MED ORDER — METHOCARBAMOL 500 MG PO TABS
500.0000 mg | ORAL_TABLET | Freq: Four times a day (QID) | ORAL | Status: DC | PRN
  Administered 2012-10-10 – 2012-10-14 (×16): 500 mg via ORAL
  Filled 2012-10-09 (×16): qty 1

## 2012-10-09 MED ORDER — ACETAMINOPHEN 500 MG PO TABS
1000.0000 mg | ORAL_TABLET | Freq: Once | ORAL | Status: AC
  Administered 2012-10-09: 1000 mg via ORAL
  Filled 2012-10-09: qty 2

## 2012-10-09 MED ORDER — LACTATED RINGERS IV SOLN
INTRAVENOUS | Status: DC
  Administered 2012-10-09: 1000 mL via INTRAVENOUS

## 2012-10-09 MED ORDER — DIPHENHYDRAMINE HCL 12.5 MG/5ML PO ELIX: 12.5000 mg | ORAL_SOLUTION | ORAL | Status: DC | PRN

## 2012-10-09 MED ORDER — POLYETHYLENE GLYCOL 3350 17 G PO PACK
17.0000 g | PACK | Freq: Every day | ORAL | Status: DC | PRN
  Administered 2012-10-11 – 2012-10-13 (×3): 17 g via ORAL

## 2012-10-09 MED ORDER — HYDROMORPHONE HCL PF 1 MG/ML IJ SOLN
INTRAMUSCULAR | Status: AC
  Filled 2012-10-09: qty 1

## 2012-10-09 MED ORDER — MORPHINE SULFATE 2 MG/ML IJ SOLN
1.0000 mg | INTRAMUSCULAR | Status: DC | PRN
  Administered 2012-10-10 – 2012-10-12 (×4): 2 mg via INTRAVENOUS
  Filled 2012-10-09 (×5): qty 1

## 2012-10-09 MED ORDER — ONDANSETRON HCL 4 MG/2ML IJ SOLN: 4.0000 mg | Freq: Four times a day (QID) | INTRAMUSCULAR | Status: DC | PRN

## 2012-10-09 MED ORDER — FENTANYL CITRATE 0.05 MG/ML IJ SOLN
INTRAMUSCULAR | Status: DC | PRN
  Administered 2012-10-09: 50 ug via INTRAVENOUS
  Administered 2012-10-09 (×2): 100 ug via INTRAVENOUS

## 2012-10-09 MED ORDER — NEOSTIGMINE METHYLSULFATE 1 MG/ML IJ SOLN
INTRAMUSCULAR | Status: DC | PRN
  Administered 2012-10-09: 4 mg via INTRAVENOUS

## 2012-10-09 MED ORDER — LIDOCAINE HCL (PF) 2 % IJ SOLN
INTRAMUSCULAR | Status: DC | PRN
  Administered 2012-10-09: 50 mg

## 2012-10-09 MED ORDER — CEFAZOLIN SODIUM 1-5 GM-% IV SOLN
1.0000 g | Freq: Four times a day (QID) | INTRAVENOUS | Status: AC
  Administered 2012-10-09 – 2012-10-10 (×2): 1 g via INTRAVENOUS
  Filled 2012-10-09 (×2): qty 50

## 2012-10-09 MED ORDER — METOCLOPRAMIDE HCL 5 MG/ML IJ SOLN: 5.0000 mg | Freq: Three times a day (TID) | INTRAMUSCULAR | Status: DC | PRN

## 2012-10-09 MED ORDER — ONDANSETRON HCL 4 MG PO TABS: 4.0000 mg | ORAL_TABLET | Freq: Four times a day (QID) | ORAL | Status: DC | PRN

## 2012-10-09 MED ORDER — DEXAMETHASONE SODIUM PHOSPHATE 10 MG/ML IJ SOLN
10.0000 mg | Freq: Every day | INTRAMUSCULAR | Status: AC
  Filled 2012-10-09: qty 1

## 2012-10-09 MED ORDER — MIDAZOLAM HCL 5 MG/5ML IJ SOLN
INTRAMUSCULAR | Status: DC | PRN
  Administered 2012-10-09: 2 mg via INTRAVENOUS

## 2012-10-09 MED ORDER — BUPIVACAINE LIPOSOME 1.3 % IJ SUSP
20.0000 mL | Freq: Once | INTRAMUSCULAR | Status: DC
  Filled 2012-10-09: qty 20

## 2012-10-09 MED ORDER — DOCUSATE SODIUM 100 MG PO CAPS
100.0000 mg | ORAL_CAPSULE | Freq: Two times a day (BID) | ORAL | Status: DC
  Administered 2012-10-09 – 2012-10-14 (×10): 100 mg via ORAL

## 2012-10-09 MED ORDER — ACETAMINOPHEN 500 MG PO TABS
1000.0000 mg | ORAL_TABLET | Freq: Four times a day (QID) | ORAL | Status: AC
  Administered 2012-10-09 – 2012-10-10 (×4): 1000 mg via ORAL
  Filled 2012-10-09 (×6): qty 2

## 2012-10-09 MED ORDER — CEFAZOLIN SODIUM-DEXTROSE 2-3 GM-% IV SOLR
INTRAVENOUS | Status: AC
  Filled 2012-10-09: qty 50

## 2012-10-09 MED ORDER — OXYCODONE HCL 5 MG/5ML PO SOLN
5.0000 mg | Freq: Once | ORAL | Status: DC | PRN
  Filled 2012-10-09: qty 5

## 2012-10-09 MED ORDER — METOCLOPRAMIDE HCL 10 MG PO TABS: 5.0000 mg | ORAL_TABLET | Freq: Three times a day (TID) | ORAL | Status: DC | PRN

## 2012-10-09 MED ORDER — PHENOL 1.4 % MT LIQD: 1.0000 | OROMUCOSAL | Status: DC | PRN

## 2012-10-09 MED ORDER — SUCCINYLCHOLINE CHLORIDE 20 MG/ML IJ SOLN
INTRAMUSCULAR | Status: DC | PRN
  Administered 2012-10-09: 100 mg via INTRAVENOUS

## 2012-10-09 MED ORDER — RIVAROXABAN 10 MG PO TABS
10.0000 mg | ORAL_TABLET | Freq: Every day | ORAL | Status: DC
Start: 2012-10-10 — End: 2012-10-14
  Administered 2012-10-10 – 2012-10-14 (×5): 10 mg via ORAL
  Filled 2012-10-09 (×6): qty 1

## 2012-10-09 MED ORDER — OXYCODONE HCL 5 MG PO TABS: 5.0000 mg | ORAL_TABLET | Freq: Once | ORAL | Status: DC | PRN

## 2012-10-09 MED ORDER — CHLORHEXIDINE GLUCONATE 4 % EX LIQD
60.0000 mL | Freq: Once | CUTANEOUS | Status: DC
  Filled 2012-10-09: qty 60

## 2012-10-09 MED ORDER — MENTHOL 3 MG MT LOZG: 1.0000 | LOZENGE | OROMUCOSAL | Status: DC | PRN

## 2012-10-09 MED ORDER — HYDROMORPHONE HCL PF 1 MG/ML IJ SOLN
INTRAMUSCULAR | Status: DC | PRN
  Administered 2012-10-09: 2 mg via INTRAVENOUS

## 2012-10-09 MED ORDER — SODIUM CHLORIDE 0.9 % IJ SOLN
INTRAMUSCULAR | Status: DC | PRN
  Administered 2012-10-09: 17:00:00

## 2012-10-09 MED ORDER — BUPIVACAINE HCL (PF) 0.25 % IJ SOLN
INTRAMUSCULAR | Status: DC | PRN
  Administered 2012-10-09: 20 mL

## 2012-10-09 MED ORDER — PROMETHAZINE HCL 25 MG/ML IJ SOLN: 6.2500 mg | INTRAMUSCULAR | Status: DC | PRN

## 2012-10-09 MED ORDER — MIDAZOLAM HCL 2 MG/2ML IJ SOLN
0.5000 mg | INTRAMUSCULAR | Status: DC
  Administered 2012-10-09 (×2): 0.5 mg via INTRAVENOUS

## 2012-10-09 MED ORDER — DEXAMETHASONE SODIUM PHOSPHATE 10 MG/ML IJ SOLN: 10.0000 mg | Freq: Once | INTRAMUSCULAR | Status: DC

## 2012-10-09 MED ORDER — ROCURONIUM BROMIDE 100 MG/10ML IV SOLN
INTRAVENOUS | Status: DC | PRN
  Administered 2012-10-09: 40 mg via INTRAVENOUS

## 2012-10-09 MED ORDER — TRANEXAMIC ACID 100 MG/ML IV SOLN
1000.0000 mg | INTRAVENOUS | Status: AC
  Administered 2012-10-09: 1000 mg via INTRAVENOUS
  Filled 2012-10-09: qty 10

## 2012-10-09 MED ORDER — GLYCOPYRROLATE 0.2 MG/ML IJ SOLN
INTRAMUSCULAR | Status: DC | PRN
  Administered 2012-10-09: 0.6 mg via INTRAVENOUS

## 2012-10-09 MED ORDER — PROPOFOL 10 MG/ML IV BOLUS
INTRAVENOUS | Status: DC | PRN
  Administered 2012-10-09: 200 mg via INTRAVENOUS

## 2012-10-09 MED ORDER — HYDROMORPHONE HCL PF 1 MG/ML IJ SOLN
0.2500 mg | INTRAMUSCULAR | Status: DC | PRN
  Administered 2012-10-09 (×4): 0.5 mg via INTRAVENOUS

## 2012-10-09 MED ORDER — METHOCARBAMOL 100 MG/ML IJ SOLN
500.0000 mg | Freq: Four times a day (QID) | INTRAVENOUS | Status: DC | PRN
  Administered 2012-10-09: 500 mg via INTRAVENOUS
  Filled 2012-10-09: qty 5

## 2012-10-09 MED ORDER — OXYCODONE HCL 5 MG PO TABS
5.0000 mg | ORAL_TABLET | ORAL | Status: DC | PRN
  Administered 2012-10-09 – 2012-10-14 (×35): 10 mg via ORAL
  Filled 2012-10-09 (×35): qty 2

## 2012-10-09 MED ORDER — MEPERIDINE HCL 50 MG/ML IJ SOLN: 6.2500 mg | INTRAMUSCULAR | Status: DC | PRN

## 2012-10-09 MED ORDER — DEXAMETHASONE 4 MG PO TABS
10.0000 mg | ORAL_TABLET | Freq: Every day | ORAL | Status: AC
  Administered 2012-10-10: 10 mg via ORAL
  Filled 2012-10-09: qty 1

## 2012-10-09 SURGICAL SUPPLY — 51 items
BAG ZIPLOCK 12X15 (MISCELLANEOUS) ×2 IMPLANT
BIT DRILL 2.8X128 (BIT) ×2 IMPLANT
BLADE EXTENDED COATED 6.5IN (ELECTRODE) ×2 IMPLANT
BLADE SAW SAG 73X25 THK (BLADE) ×1
BLADE SAW SGTL 73X25 THK (BLADE) ×1 IMPLANT
CAPT HIP PF COP ×2 IMPLANT
CLOTH BEACON ORANGE TIMEOUT ST (SAFETY) ×2 IMPLANT
DECANTER SPIKE VIAL GLASS SM (MISCELLANEOUS) ×2 IMPLANT
DRAPE INCISE IOBAN 66X45 STRL (DRAPES) ×2 IMPLANT
DRAPE ORTHO SPLIT 77X108 STRL (DRAPES) ×2
DRAPE POUCH INSTRU U-SHP 10X18 (DRAPES) ×2 IMPLANT
DRAPE SURG ORHT 6 SPLT 77X108 (DRAPES) ×2 IMPLANT
DRAPE U-SHAPE 47X51 STRL (DRAPES) ×2 IMPLANT
DRSG ADAPTIC 3X8 NADH LF (GAUZE/BANDAGES/DRESSINGS) ×2 IMPLANT
DRSG MEPILEX BORDER 4X4 (GAUZE/BANDAGES/DRESSINGS) ×2 IMPLANT
DRSG MEPILEX BORDER 4X8 (GAUZE/BANDAGES/DRESSINGS) ×2 IMPLANT
DURAPREP 26ML APPLICATOR (WOUND CARE) ×2 IMPLANT
ELECT REM PT RETURN 9FT ADLT (ELECTROSURGICAL) ×2
ELECTRODE REM PT RTRN 9FT ADLT (ELECTROSURGICAL) ×1 IMPLANT
EVACUATOR 1/8 PVC DRAIN (DRAIN) ×2 IMPLANT
FACESHIELD LNG OPTICON STERILE (SAFETY) ×8 IMPLANT
GLOVE BIO SURGEON STRL SZ7.5 (GLOVE) ×2 IMPLANT
GLOVE BIO SURGEON STRL SZ8 (GLOVE) ×2 IMPLANT
GLOVE BIOGEL PI IND STRL 8 (GLOVE) ×3 IMPLANT
GLOVE BIOGEL PI INDICATOR 8 (GLOVE) ×3
GLOVE SURG SS PI 6.5 STRL IVOR (GLOVE) ×4 IMPLANT
GOWN PREVENTION PLUS LG XLONG (DISPOSABLE) ×4 IMPLANT
GOWN STRL REIN XL XLG (GOWN DISPOSABLE) ×2 IMPLANT
IMMOBILIZER KNEE 20 (SOFTGOODS)
IMMOBILIZER KNEE 20 THIGH 36 (SOFTGOODS) IMPLANT
KIT BASIN OR (CUSTOM PROCEDURE TRAY) ×2 IMPLANT
MANIFOLD NEPTUNE II (INSTRUMENTS) ×2 IMPLANT
NDL SAFETY ECLIPSE 18X1.5 (NEEDLE) ×1 IMPLANT
NEEDLE HYPO 18GX1.5 SHARP (NEEDLE) ×1
NEEDLE HYPO 22GX1.5 SAFETY (NEEDLE) ×2 IMPLANT
NS IRRIG 1000ML POUR BTL (IV SOLUTION) ×2 IMPLANT
PACK TOTAL JOINT (CUSTOM PROCEDURE TRAY) ×2 IMPLANT
PASSER SUT SWANSON 36MM LOOP (INSTRUMENTS) ×2 IMPLANT
POSITIONER SURGICAL ARM (MISCELLANEOUS) ×2 IMPLANT
STRIP CLOSURE SKIN 1/2X4 (GAUZE/BANDAGES/DRESSINGS) ×2 IMPLANT
SUT ETHIBOND NAB CT1 #1 30IN (SUTURE) ×4 IMPLANT
SUT MNCRL AB 4-0 PS2 18 (SUTURE) ×2 IMPLANT
SUT VIC AB 2-0 CT1 27 (SUTURE) ×3
SUT VIC AB 2-0 CT1 TAPERPNT 27 (SUTURE) ×3 IMPLANT
SUT VLOC 180 0 24IN GS25 (SUTURE) ×4 IMPLANT
SYR 20CC LL (SYRINGE) ×2 IMPLANT
SYR 50ML LL SCALE MARK (SYRINGE) ×2 IMPLANT
TOWEL OR 17X26 10 PK STRL BLUE (TOWEL DISPOSABLE) ×4 IMPLANT
TOWEL OR NON WOVEN STRL DISP B (DISPOSABLE) ×2 IMPLANT
TRAY FOLEY CATH 14FRSI W/METER (CATHETERS) ×2 IMPLANT
WATER STERILE IRR 1500ML POUR (IV SOLUTION) ×2 IMPLANT

## 2012-10-09 NOTE — Op Note (Signed)
Pre-operative diagnosis- Osteoarthritis Right hip  Post-operative diagnosis- Osteoarthritis  Right hip  Procedure-  RightTotal Hip Arthroplasty  Surgeon- Gus Rankin. Secret Kristensen, MD  Assistant- Avel Peace, PA-C   Anesthesia  General  EBL- 300 ml   Drain Hemovac   Complication- None  Condition-PACU - hemodynamically stable.   Brief Clinical Note-  Alyssa Rangel is a 47 y.o. female with end stage arthritis of her right hip with progressively worsening pain and dysfunction. Pain occurs with activity and rest including pain at night. She has tried analgesics, protected weight bearing and rest without benefit. Pain is too severe to attempt physical therapy. Radiographs demonstrate bone on bone arthritis with subchondral cyst formation and massive osteophyte formation with a previous SCFE deformity. She presents now for right THA.  Procedure in detail-   The patient is brought into the operating room and placed on the operating table. After successful administration of General  anesthesia, the patient is placed in the  Left lateral decubitus position with the  Right side up and held in place with the hip positioner. The lower extremity is isolated from the perineum with plastic drapes and time-out is performed by the surgical team. The lower extremity is then prepped and draped in the usual sterile fashion. A short posterolateral incision is made with a ten blade through the subcutaneous tissue to the level of the fascia lata which is incised in line with the skin incision. The sciatic nerve is palpated and protected and the short external rotators and capsule are isolated from the femur. The hip is then dislocated and the center of the femoral head is marked. A trial prosthesis is placed such that the trial head corresponds to the center of the patients' native femoral head. The resection level is marked on the femoral neck and the resection is made with an oscillating saw. The femoral head is  removed and femoral retractors placed to gain access to the femoral canal.      The canal finder is passed into the femoral canal and the canal is thoroughly irrigated with sterile saline to remove the fatty contents. Axial reaming is performed to 15.5  mm, proximal reaming to 20 B  and the sleeve machined to a small. A  20 B small trial sleeve is placed into the proximal femur.      The femur is then retracted anteriorly to gain acetabular exposure. Acetabular retractors are placed and the labrum and a massive amount ofosteophytes are removed, Acetabular reaming is performed to 51  mm and a 52  mm Pinnacle acetabular shell is placed in anatomic position with excellent purchase. Additional dome screws were not needed. An apex hole eliminator is placed and the permanent 32 mm neutral + 4 Marathon liner is placed into the acetabular shell.      The trial femur is then placed into the femoral canal. The size is 20 x 15  stem with a 36 + 8  neck and a 32 + 6 head with the neck version matching  the patients' native anteversion. The hip is reduced with excellent stability with full extension and full external rotation, 70 degrees flexion with 40 degrees adduction and 90 degrees internal rotation and 90 degrees of flexion with 70 degrees of internal rotation. The operative leg is placed on top of the non-operative leg and the leg lengths show approximately 1 cm short on the right which is a tremendous improvement from pre-op. The trials are then removed and the permanent implant  of the same size is impacted into the femoral canal. The ceramic femoral head of the same size as the trial is placed and the hip is reduced with the same stability parameters. The operative leg is again placed on top of the non-operative leg and the leg lengths are found to be equal.      The wound is then copiously irrigated with saline solution and the capsule and short external rotators are re-attached to the femur through drill holes with  Ethibond suture. The fascia lata is closed over a hemovac drain with #1 vicryl suture and the fascia lata, gluteal muscles and subcutaneous tissues are injected with Exparel 20ml diluted with saline 30 ml plus 20 ml of .25% Marcaine. The subcutaneous tissues are closed with #1 and2-0 vicryl and the subcuticular layer closed with running 4-0 Monocryl. The drain is hooked to suction, incision cleaned and dried, and steri-srips and a bulky sterile dressing applied. The limb is placed into a knee immobilizer and the patient is awakened and transported to recovery in stable condition.      Please note that a surgical assistant was a medical necessity for this procedure in order to perform it in a safe and expeditious manner. The assistant was necessary to provide retraction to the vital neurovascular structures and to retract and position the limb to allow for anatomic placement of the prosthetic components.  Gus Rankin Vivion Romano, MD    10/09/2012, 5:43 PM

## 2012-10-09 NOTE — Preoperative (Signed)
Beta Blockers   Reason not to administer Beta Blockers:Not Applicable 

## 2012-10-09 NOTE — Interval H&P Note (Signed)
History and Physical Interval Note:  10/09/2012 3:22 PM  Alyssa Rangel  has presented today for surgery, with the diagnosis of OA RIGHT HIP   The various methods of treatment have been discussed with the patient and family. After consideration of risks, benefits and other options for treatment, the patient has consented to  Procedure(s): RIGHT TOTAL HIP ARTHROPLASTY (Right) as a surgical intervention .  The patient's history has been reviewed, patient examined, no change in status, stable for surgery.  I have reviewed the patient's chart and labs.  Questions were answered to the patient's satisfaction.     Loanne Drilling

## 2012-10-09 NOTE — Anesthesia Preprocedure Evaluation (Addendum)
Anesthesia Evaluation  Patient identified by MRN, date of birth, ID band Patient awake    Reviewed: Allergy & Precautions, H&P , NPO status , Patient's Chart, lab work & pertinent test results  Airway Mallampati: I TM Distance: >3 FB Neck ROM: full    Dental no notable dental hx. (+) Dental Advisory Given   Pulmonary neg pulmonary ROS,    Pulmonary exam normal       Cardiovascular hypertension, Pt. on medications Rhythm:Regular Rate:Normal     Neuro/Psych negative neurological ROS  negative psych ROS   GI/Hepatic negative GI ROS, Neg liver ROS,   Endo/Other  Morbid obesity  Renal/GU negative Renal ROS     Musculoskeletal  (+) Arthritis -,   Abdominal (+) + obese,   Peds  Hematology negative hematology ROS (+)   Anesthesia Other Findings   Reproductive/Obstetrics negative OB ROS                           Anesthesia Physical  Anesthesia Plan  ASA: III  Anesthesia Plan: General   Post-op Pain Management:    Induction: Intravenous  Airway Management Planned: Oral ETT  Additional Equipment:   Intra-op Plan:   Post-operative Plan: Extubation in OR  Informed Consent: I have reviewed the patients History and Physical, chart, labs and discussed the procedure including the risks, benefits and alternatives for the proposed anesthesia with the patient or authorized representative who has indicated his/her understanding and acceptance.   Dental advisory given  Plan Discussed with: CRNA  Anesthesia Plan Comments:        Anesthesia Quick Evaluation

## 2012-10-09 NOTE — Transfer of Care (Signed)
Immediate Anesthesia Transfer of Care Note  Patient: Alyssa Rangel  Procedure(s) Performed: Procedure(s) (LRB): RIGHT TOTAL HIP ARTHROPLASTY (Right)  Patient Location: PACU  Anesthesia Type: General  Level of Consciousness: sedated, patient cooperative and responds to stimulation  Airway & Oxygen Therapy: Patient Spontanous Breathing and Patient connected to face mask oxgen  Post-op Assessment: Report given to PACU RN and Post -op Vital signs reviewed and stable  Post vital signs: Reviewed and stable  Complications: No apparent anesthesia complications

## 2012-10-09 NOTE — Anesthesia Postprocedure Evaluation (Signed)
  Anesthesia Post-op Note  Anesthesia Post Note  Patient: Alyssa Rangel  Procedure(s) Performed: Procedure(s) (LRB): RIGHT TOTAL HIP ARTHROPLASTY (Right)  Anesthesia type: General  Patient location: PACU  Post pain: Pain level controlled  Post assessment: Post-op Vital signs reviewed  Last Vitals:  Filed Vitals:   10/09/12 1930  BP: 140/61  Pulse: 68  Temp:   Resp: 10    Post vital signs: Reviewed  Level of consciousness: sedated  Complications: No apparent anesthesia complications

## 2012-10-10 ENCOUNTER — Encounter (HOSPITAL_COMMUNITY): Payer: Self-pay | Admitting: Orthopedic Surgery

## 2012-10-10 DIAGNOSIS — E871 Hypo-osmolality and hyponatremia: Secondary | ICD-10-CM | POA: Diagnosis not present

## 2012-10-10 LAB — BASIC METABOLIC PANEL
BUN: 10 mg/dL (ref 6–23)
CO2: 23 mEq/L (ref 19–32)
Calcium: 8.8 mg/dL (ref 8.4–10.5)
Chloride: 99 mEq/L (ref 96–112)
Creatinine, Ser: 0.66 mg/dL (ref 0.50–1.10)

## 2012-10-10 LAB — CBC
HCT: 32 % — ABNORMAL LOW (ref 36.0–46.0)
MCH: 26 pg (ref 26.0–34.0)
MCV: 81.6 fL (ref 78.0–100.0)
Platelets: 299 10*3/uL (ref 150–400)
RBC: 3.92 MIL/uL (ref 3.87–5.11)
WBC: 12.5 10*3/uL — ABNORMAL HIGH (ref 4.0–10.5)

## 2012-10-10 NOTE — Care Management Note (Signed)
    Page 1 of 2   10/11/2012     2:08:39 PM   CARE MANAGEMENT NOTE 10/11/2012  Patient:  Alyssa Rangel, Alyssa Rangel   Account Number:  [id number]  Date Initiated:  10/10/2012  Documentation initiated by:  Colleen Can  Subjective/Objective Assessment:   DX OA rt hip; total hip replacemnt     Action/Plan:   CM spoke with patient. Plans are for her to return to her home where her mother will assist with her care. She will need RW and 3n1. [name] will provide [facility] services.   Anticipated DC Date:  10/12/2012   Anticipated DC Plan:  HOME W HOME HEALTH SERVICES      DC Planning Services  CM consult      PAC Choice  DURABLE MEDICAL EQUIPMENT  HOME HEALTH   Choice offered to / List presented to:  C-1 Patient   DME arranged  3-N-1  Levan Hurst      DME agency  [facility]     HH arranged  HH-2 PT      [facility] agency  [facility]   Status of service:  Completed, signed off Medicare Important Message given?   (If response is "NO", the following Medicare IM given date fields will be blank) Date Medicare IM given:   Date Additional Medicare IM given:    Discharge Disposition:    Per UR Regulation:    If discussed at Long Length of Stay Meetings, dates discussed:    Comments:  10/11/2012 Colleen Can BSN RN CCM [phone] [name] will provide [facility] services upon pt's discharge to home. Services will start day after pt is discharged.

## 2012-10-10 NOTE — Evaluation (Signed)
Physical Therapy Evaluation Patient Details Name: Alyssa Rangel MRN: [id number] DOB: March 05, 1965 Today's Date: 10/10/2012 Time: 1478-2956 PT Time Calculation (min): 25 min  PT Assessment / Plan / Recommendation History of Present Illness  s/p right posterior THA  Clinical Impression  Pt will benefit form PT to address deficits below    PT Assessment  Patient needs continued PT services    Follow Up Recommendations  Home health PT;Supervision for mobility/OOB    Does the patient have the potential to tolerate intense rehabilitation      Barriers to Discharge        Equipment Recommendations  Rolling walker with 5" wheels    Recommendations for Other Services     Frequency 7X/week    Precautions / Restrictions Precautions Precautions: Fall Restrictions Weight Bearing Restrictions: No Other Position/Activity Restrictions: WBAT   Pertinent Vitals/Pain C/o 6/10 pain right hip before PT, 9/10 after and RN giving meds      Mobility  Bed Mobility Bed Mobility: Supine to Sit Details for Bed Mobility Assistance: min/mod assist for RLE and trunk, verbal cues for precautions Transfers Transfers: Sit to Stand;Stand to Sit Sit to Stand: 4: Min assist Stand to Sit: 4: Min assist Details for Transfer Assistance: verbal cues for THP Ambulation/Gait Ambulation/Gait Assistance: 1: +2 Total assist Ambulation/Gait: Patient Percentage: 80% Ambulation Distance (Feet): 40 Feet Assistive device: Rolling walker Ambulation/Gait Assistance Details: verbal cues for upward gaze, sequence, use of UEs;  Gait Pattern: Step-to pattern General Gait Details: +2 utilized for safety, pt with dizziness at end of gait distance where she let go of RW and her knees buckled; No LOC, chair behind pt for safety; VSS with BP 126/72, HR 98 and sats 98% on RA    Exercises     PT Diagnosis: Difficulty walking  PT Problem List: Decreased strength;Decreased range of motion;Decreased activity  tolerance;Decreased balance;Decreased mobility;Decreased knowledge of use of DME;Decreased safety awareness;Decreased knowledge of precautions;Obesity PT Treatment Interventions: DME instruction;Gait training;Functional mobility training;Therapeutic activities;Stair training;Therapeutic exercise;Patient/family education     PT Goals(Current goals can be found in the care plan section) Acute Rehab PT Goals PT Goal Formulation: With patient Time For Goal Achievement: 10/17/12 Potential to Achieve Goals: Good  Visit Information  Last PT Received On: 10/10/12 Assistance Needed: +2 (safety) History of Present Illness: s/p right posterior THA       Prior Functioning  Home Living Family/patient expects to be discharged to:: Private residence Living Arrangements: Parent Type of Home: House Home Access: Stairs to enter Secretary/administrator of Steps: 4 Entrance Stairs-Rails: Right Home Layout: One level Home Equipment: Cane - single point Additional Comments: amb with cane prior to adm due to hip pain Prior Function Level of Independence: Independent with assistive device(s) Communication Communication: No difficulties    Cognition  Cognition Arousal/Alertness: Awake/alert Behavior During Therapy: WFL for tasks assessed/performed Overall Cognitive Status: Within Functional Limits for tasks assessed    Extremity/Trunk Assessment Lower Extremity Assessment Lower Extremity Assessment: RLE deficits/detail RLE: Unable to fully assess due to pain   Balance    End of Session PT - End of Session Activity Tolerance: Patient limited by fatigue Patient left: in chair;with call bell/phone within reach;with family/visitor present Nurse Communication: Mobility status;Patient requests pain meds  GP     [facility] 10/10/2012, 11:54 AM

## 2012-10-10 NOTE — Progress Notes (Signed)
Utilization review completed.  

## 2012-10-10 NOTE — Plan of Care (Signed)
Problem: Consults Goal: Diagnosis- Total Joint Replacement Outcome: Completed/Met Date Met:  10/10/12 Primary Total Hip RIGHT

## 2012-10-10 NOTE — Progress Notes (Signed)
   Subjective: 1 Day Post-Op Procedure(s) (LRB): RIGHT TOTAL HIP ARTHROPLASTY (Right) Patient reports pain as mild and moderate.   Patient seen in rounds with Dr. Lequita Halt. She is doing fairly well today.  She can tell a difference already from preop Patient is well, but has had some minor complaints of pain in the hip, requiring pain medications We will start therapy today.  Plan is to go Home after hospital stay.  Objective: Vital signs in last 24 hours: Temp:  [97.6 F (36.4 C)-99.2 F (37.3 C)] 98.8 F (37.1 C) (10/02 0450) Pulse Rate:  [65-107] 66 (10/02 0450) Resp:  [9-22] 16 (10/02 0450) BP: (112-181)/(61-100) 112/69 mmHg (10/02 0450) SpO2:  [97 %-100 %] 99 % (10/02 0450) Weight:  [101.266 kg (223 lb 4 oz)] 101.266 kg (223 lb 4 oz) (10/01 2000)  Intake/Output from previous day:  Intake/Output Summary (Last 24 hours) at 10/10/12 0912 Last data filed at 10/10/12 0815  Gross per 24 hour  Intake 3110.84 ml  Output    965 ml  Net 2145.84 ml    Intake/Output this shift: Total I/O In: 240 [P.O.:240] Out: -   Labs:  Recent Labs  10/10/12 0420  HGB 10.2*    Recent Labs  10/10/12 0420  WBC 12.5*  RBC 3.92  HCT 32.0*  PLT 299    Recent Labs  10/10/12 0420  NA 130*  K 4.2  CL 99  CO2 23  BUN 10  CREATININE 0.66  GLUCOSE 211*  CALCIUM 8.8   No results found for this basename: LABPT, INR,  in the last 72 hours  EXAM General - Patient is Alert, Appropriate and Oriented Extremity - Neurovascular intact Sensation intact distally Dressing - dressing C/D/I Motor Function - intact, moving foot and toes well on exam.  Hemovac pulled without difficulty.  Past Medical History  Diagnosis Date  . Hip pain     pt states no cartilage in right hip very painful  . Hearing loss   . Routine gynecological examination     Dr. Osborn Coho, [facility]  . Hypertension     due to diet pills in the past, never on meds; normotensive as of 7/14  .  Hypercholesterolemia     prior medication therapy  . Rheumatoid arthritis(714.0)     patient reported history  . 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 Polyarthralgia   . Anemia   . History of body piercing     tongue ring    Assessment/Plan: 1 Day Post-Op Procedure(s) (LRB): RIGHT TOTAL HIP ARTHROPLASTY (Right) Principal Problem:   OA (osteoarthritis) of hip Active Problems:   Hyponatremia  Estimated body mass index is 38.92 kg/(m^2) as calculated from the following:   Height as of this encounter: 5' 3.5" (1.613 m).   Weight as of this encounter: 101.266 kg (223 lb 4 oz). Advance diet Up with therapy Plan for discharge tomorrow Discharge home with home health  DVT Prophylaxis - Xarelto Weight Bearing As Tolerated right Leg D/C Knee Immobilizer Hemovac Pulled Begin Therapy Hip Preacutions No vaccines.  Arlean Thies 10/10/2012, 9:12 AM

## 2012-10-10 NOTE — Progress Notes (Signed)
10/10/12 1400  PT Visit Information  Last PT Received On 10/10/12  Assistance Needed +2  History of Present Illness s/p right posterior THA  PT Time Calculation  PT Start Time 1422  PT Stop Time 1448  PT Time Calculation (min) 26 min  Precautions  Precautions Fall;Posterior Hip  Restrictions  Other Position/Activity Restrictions WBAT  Cognition  Arousal/Alertness Awake/alert  Behavior During Therapy WFL for tasks assessed/performed  Overall Cognitive Status Within Functional Limits for tasks assessed  Bed Mobility  Bed Mobility Sit to Supine  Sit to Supine 4: Min assist  Details for Bed Mobility Assistance assist for RLE and verbal cues for technique  Transfers  Transfers Sit to Stand;Stand to Sit;Stand Pivot Transfers  Sit to Stand 4: Min guard;4: Min assist  Stand to Sit 4: Min guard;4: Environmental education officer Transfers 4: Min assist  Details for Transfer Assistance verbal cues for THP  Ambulation/Gait  Ambulation/Gait Assistance 4: Min assist  Ambulation Distance (Feet) 10 Feet  Assistive device Rolling walker  Ambulation/Gait Assistance Details verbal cues for upward gaze, sequence, use of UEs;   Gait Pattern Step-to pattern  General Gait Details better gait stability this pm  Total Joint Exercises  Ankle Circles/Pumps AROM;10 reps;Both  Quad Sets AROM;10 reps;Strengthening;Both  Gluteal Sets Both;AROM;Strengthening;10 reps  Heel Slides AROM;Right;10 reps;AAROM  Hip ABduction/ADduction AROM;AAROM;Right;10 reps  PT - End of Session  Equipment Utilized During Treatment Gait belt  Activity Tolerance Patient tolerated treatment well  Patient left in bed;with call bell/phone within reach;with family/visitor present  Nurse Communication Mobility status;Patient requests pain meds  PT - Assessment/Plan  PT Plan Current plan remains appropriate  PT Frequency 7X/week  Follow Up Recommendations Home health PT;Supervision for mobility/OOB  PT equipment Rolling walker with 5"  wheels  PT Goal Progression  Progress towards PT goals Progressing toward goals  Acute Rehab PT Goals  Time For Goal Achievement 10/17/12  Potential to Achieve Goals Good  PT General Charges  $$ ACUTE PT VISIT 1 Procedure  PT Treatments  $Therapeutic Exercise 8-22 mins  $Therapeutic Activity 8-22 mins

## 2012-10-10 NOTE — Anesthesia Postprocedure Evaluation (Signed)
Anesthesia Post Note  Patient: Alyssa Rangel  Procedure(s) Performed: Procedure(s) (LRB): RIGHT TOTAL HIP ARTHROPLASTY (Right)  Anesthesia type: General  Patient location: PACU  Post pain: Pain level controlled  Post assessment: Post-op Vital signs reviewed  Last Vitals: BP 120/76  Pulse 65  Temp(Src) 37.3 C (Oral)  Resp 16  SpO2 97%  Post vital signs: Reviewed  Level of consciousness: sedated  Complications: No apparent anesthesia complications

## 2012-10-10 NOTE — Progress Notes (Signed)
[facility]  [facility] is providing the following services: RW and Commode  If patient discharges after hours, please call [phone].   Renard Hamper 10/10/2012, 11:35 AM

## 2012-10-11 LAB — CBC
HCT: 30.3 % — ABNORMAL LOW (ref 36.0–46.0)
MCH: 26.8 pg (ref 26.0–34.0)
MCHC: 32.7 g/dL (ref 30.0–36.0)
MCV: 81.9 fL (ref 78.0–100.0)
Platelets: 305 10*3/uL (ref 150–400)
RDW: 13.7 % (ref 11.5–15.5)
WBC: 15.3 10*3/uL — ABNORMAL HIGH (ref 4.0–10.5)

## 2012-10-11 LAB — BASIC METABOLIC PANEL
BUN: 7 mg/dL (ref 6–23)
Calcium: 8.7 mg/dL (ref 8.4–10.5)
Chloride: 102 mEq/L (ref 96–112)
Creatinine, Ser: 0.53 mg/dL (ref 0.50–1.10)
GFR calc Af Amer: >90 mL/min (ref >90)
GFR calc non Af Amer: >90 mL/min (ref >90)
Glucose, Bld: 200 mg/dL — ABNORMAL HIGH (ref 70–99)

## 2012-10-11 MED ORDER — TRAMADOL HCL 50 MG PO TABS: 50.0000 mg | ORAL_TABLET | Freq: Four times a day (QID) | ORAL | Status: DC | PRN

## 2012-10-11 MED ORDER — RIVAROXABAN 10 MG PO TABS: 10.0000 mg | ORAL_TABLET | Freq: Every day | ORAL | Status: DC

## 2012-10-11 MED ORDER — METHOCARBAMOL 500 MG PO TABS: 500.0000 mg | ORAL_TABLET | Freq: Four times a day (QID) | ORAL | Status: DC | PRN

## 2012-10-11 MED ORDER — OXYCODONE HCL 5 MG PO TABS: 5.0000 mg | ORAL_TABLET | ORAL | Status: DC | PRN

## 2012-10-11 NOTE — Progress Notes (Signed)
   Subjective: 2 Days Post-Op Procedure(s) (LRB): RIGHT TOTAL HIP ARTHROPLASTY (Right) Patient reports pain as mild.   Patient seen in rounds with Dr. Lequita Halt.  She has some discomfort and pain yesterday after getting up but better this morning.  Should be ready to go if meets her goals.  Plan on later today. Patient is well, and has had no acute complaints or problems Patient is ready to go home alter today if meets goals.  Objective: Vital signs in last 24 hours: Temp:  [98.6 F (37 C)-99.8 F (37.7 C)] 98.6 F (37 C) (10/03 0546) Pulse Rate:  [76-84] 76 (10/03 0546) Resp:  [16] 16 (10/03 0546) BP: (119-121)/(66-71) 119/66 mmHg (10/03 0546) SpO2:  [94 %-98 %] 94 % (10/03 0546)  Intake/Output from previous day:  Intake/Output Summary (Last 24 hours) at 10/11/12 0857 Last data filed at 10/11/12 1610  Gross per 24 hour  Intake 2710.75 ml  Output   2525 ml  Net 185.75 ml    Intake/Output this shift: Total I/O In: 240 [P.O.:240] Out: 250 [Urine:250]  Labs:  Recent Labs  10/10/12 0420 10/11/12 0449  HGB 10.2* 9.9*    Recent Labs  10/10/12 0420 10/11/12 0449  WBC 12.5* 15.3*  RBC 3.92 3.70*  HCT 32.0* 30.3*  PLT 299 305    Recent Labs  10/10/12 0420 10/11/12 0449  NA 130* 135  K 4.2 4.2  CL 99 102  CO2 23 26  BUN 10 7  CREATININE 0.66 0.53  GLUCOSE 211* 200*  CALCIUM 8.8 8.7   No results found for this basename: LABPT, INR,  in the last 72 hours  EXAM: General - Patient is Alert, Appropriate and Oriented Extremity - Neurovascular intact Sensation intact distally Dorsiflexion/Plantar flexion intact Incision - clean, dry, no drainage, healing Motor Function - intact, moving foot and toes well on exam.   Assessment/Plan: 2 Days Post-Op Procedure(s) (LRB): RIGHT TOTAL HIP ARTHROPLASTY (Right) Procedure(s) (LRB): RIGHT TOTAL HIP ARTHROPLASTY (Right) Past Medical History  Diagnosis Date  . Hip pain     pt states no cartilage in right hip very  painful  . Hearing loss   . Routine gynecological examination     Dr. Osborn Coho, [facility]  . Hypertension     due to diet pills in the past, never on meds; normotensive as of 7/14  . Hypercholesterolemia     prior medication therapy  . Rheumatoid arthritis(714.0)     patient reported history  . 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 Polyarthralgia   . Anemia   . History of body piercing     tongue ring   Principal Problem:   OA (osteoarthritis) of hip Active Problems:   Hyponatremia  Estimated body mass index is 38.92 kg/(m^2) as calculated from the following:   Height as of this encounter: 5' 3.5" (1.613 m).   Weight as of this encounter: 101.266 kg (223 lb 4 oz). Up with therapy Discharge home with home health after two sessions of therapy. Diet - Cardiac diet Follow up - in 2 weeks Activity - WBAT Disposition - Home Condition Upon Discharge - Good D/C Meds - See DC Summary DVT Prophylaxis - Xarelto  Alyssa Rangel 10/11/2012, 8:57 AM

## 2012-10-11 NOTE — Discharge Summary (Signed)
Physician Discharge Summary   Patient ID: Alyssa Rangel MRN: [id number] DOB/AGE: January 14, 1965 47 y.o.  Admit date: 10/09/2012 Discharge date: 10/11/2012  Primary Diagnosis:  Osteoarthritis Right hip  Admission Diagnoses:  Past Medical History  Diagnosis Date  . Hip pain     pt states no cartilage in right hip very painful  . Hearing loss   . Routine gynecological examination     Dr. Osborn Coho, [facility]  . Hypertension     due to diet pills in the past, never on meds; normotensive as of 7/14  . Hypercholesterolemia     prior medication therapy  . Rheumatoid arthritis(714.0)     patient reported history  . 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 Polyarthralgia   . Anemia   . History of body piercing     tongue ring   Discharge Diagnoses:   Principal Problem:   OA (osteoarthritis) of hip Active Problems:   Hyponatremia  Estimated body mass index is 38.92 kg/(m^2) as calculated from the following:   Height as of this encounter: 5' 3.5" (1.613 m).   Weight as of this encounter: 101.266 kg (223 lb 4 oz).  Procedure(s) (LRB): RIGHT TOTAL HIP ARTHROPLASTY (Right)   Consults: None  HPI: Alyssa Rangel is a 47 y.o. female with end stage arthritis of her right hip with progressively worsening pain and dysfunction. Pain occurs with activity and rest including pain at night. She has tried analgesics, protected weight bearing and rest without benefit. Pain is too severe to attempt physical therapy. Radiographs demonstrate bone on bone arthritis with subchondral cyst formation and massive osteophyte formation with a previous SCFE deformity. She presents now for right THA.  Laboratory Data: Admission on 10/09/2012  Component Date Value Range Status  . WBC 10/10/2012 12.5* 4.0 - 10.5 K/uL Final  . RBC 10/10/2012 3.92  3.87 - 5.11 MIL/uL Final  . Hemoglobin 10/10/2012 10.2* 12.0 - 15.0 g/dL Final  . HCT 41/32/4401  32.0* 36.0 - 46.0 % Final  . MCV 10/10/2012 81.6  78.0 - 100.0 fL Final  . MCH 10/10/2012 26.0  26.0 - 34.0 pg Final  . MCHC 10/10/2012 31.9  30.0 - 36.0 g/dL Final  . RDW 02/72/5366 13.6  11.5 - 15.5 % Final  . Platelets 10/10/2012 299  150 - 400 K/uL Final  . Sodium 10/10/2012 130* 135 - 145 mEq/L Final  . Potassium 10/10/2012 4.2  3.5 - 5.1 mEq/L Final  . Chloride 10/10/2012 99  96 - 112 mEq/L Final  . CO2 10/10/2012 23  19 - 32 mEq/L Final  . Glucose, Bld 10/10/2012 211* 70 - 99 mg/dL Final  . BUN 44/03/4740 10  6 - 23 mg/dL Final  . Creatinine, Ser 10/10/2012 0.66  0.50 - 1.10 mg/dL Final  . Calcium 59/56/3875 8.8  8.4 - 10.5 mg/dL Final  . GFR calc non Af Amer 10/10/2012 >90  >90 mL/min Final  . GFR calc Af Amer 10/10/2012 >90  >90 mL/min Final   Comment: (NOTE)                          The eGFR has been calculated using the CKD EPI equation.                          This calculation has not been validated in all clinical situations.  eGFR's persistently <90 mL/min signify possible Chronic Kidney                          Disease.  . WBC 10/11/2012 15.3* 4.0 - 10.5 K/uL Final  . RBC 10/11/2012 3.70* 3.87 - 5.11 MIL/uL Final  . Hemoglobin 10/11/2012 9.9* 12.0 - 15.0 g/dL Final  . HCT 21/30/8657 30.3* 36.0 - 46.0 % Final  . MCV 10/11/2012 81.9  78.0 - 100.0 fL Final  . MCH 10/11/2012 26.8  26.0 - 34.0 pg Final  . MCHC 10/11/2012 32.7  30.0 - 36.0 g/dL Final  . RDW 84/69/6295 13.7  11.5 - 15.5 % Final  . Platelets 10/11/2012 305  150 - 400 K/uL Final  . Sodium 10/11/2012 135  135 - 145 mEq/L Final  . Potassium 10/11/2012 4.2  3.5 - 5.1 mEq/L Final  . Chloride 10/11/2012 102  96 - 112 mEq/L Final  . CO2 10/11/2012 26  19 - 32 mEq/L Final  . Glucose, Bld 10/11/2012 200* 70 - 99 mg/dL Final  . BUN 28/41/3244 7  6 - 23 mg/dL Final  . Creatinine, Ser 10/11/2012 0.53  0.50 - 1.10 mg/dL Final  . Calcium 01/11/7251 8.7  8.4 - 10.5 mg/dL Final  . GFR calc non Af  Amer 10/11/2012 >90  >90 mL/min Final  . GFR calc Af Amer 10/11/2012 >90  >90 mL/min Final   Comment: (NOTE)                          The eGFR has been calculated using the CKD EPI equation.                          This calculation has not been validated in all clinical situations.                          eGFR's persistently <90 mL/min signify possible Chronic Kidney                          Disease.  Hospital Outpatient Visit on 10/01/2012  Component Date Value Range Status  . Specimen Description 10/01/2012 URINE, CLEAN CATCH   Final  . Special Requests 10/01/2012 NONE   Final  . Culture  Setup Time 10/01/2012    Final                   Value:10/01/2012 17:27                         Performed at [facility]  . Colony Count 10/01/2012    Final                   Value:NO GROWTH                         Performed at [facility]  . Culture 10/01/2012    Final                   Value:NO GROWTH                         Performed at [facility]  . Report Status 10/01/2012 10/02/2012 FINAL   Final  Hospital Outpatient Visit on 10/01/2012  Component Date Value Range Status  . MRSA, PCR 10/01/2012 NEGATIVE  NEGATIVE Final  . Staphylococcus aureus 10/01/2012 NEGATIVE  NEGATIVE Final   Comment:                                 The Xpert SA Assay (FDA                          approved for NASAL specimens                          in patients over 35 years of age),                          is one component of                          a comprehensive surveillance                          program.  Test performance has                          been validated by [employer] [facility] for patients greater                          than or equal to 65 year old.                          It is not intended                          to diagnose infection nor to                          guide or monitor treatment.  Marland Kitchen aPTT 10/01/2012 28  24 - 37 seconds Final  .  WBC 10/01/2012 8.3  4.0 - 10.5 K/uL Final  . RBC 10/01/2012 4.73  3.87 - 5.11 MIL/uL Final  . Hemoglobin 10/01/2012 12.5  12.0 - 15.0 g/dL Final  . HCT 16/10/9602 39.0  36.0 - 46.0 % Final  . MCV 10/01/2012 82.5  78.0 - 100.0 fL Final  . MCH 10/01/2012 26.4  26.0 - 34.0 pg Final  . MCHC 10/01/2012 32.1  30.0 - 36.0 g/dL Final  . RDW 54/09/8117 13.4  11.5 - 15.5 % Final  . Platelets 10/01/2012 326  150 - 400 K/uL Final  . Sodium 10/01/2012 136  135 - 145 mEq/L Final  . Potassium 10/01/2012 4.1  3.5 - 5.1 mEq/L Final  . Chloride 10/01/2012 102  96 - 112 mEq/L Final  . CO2 10/01/2012 27  19 - 32 mEq/L Final  . Glucose, Bld 10/01/2012 93  70 - 99 mg/dL Final  . BUN 14/78/2956 11  6 - 23 mg/dL Final  . Creatinine, Ser 10/01/2012 0.68  0.50 - 1.10 mg/dL Final  . Calcium 21/30/8657 9.5  8.4 - 10.5 mg/dL Final  . Total Protein 10/01/2012 7.4  6.0 - 8.3  g/dL Final  . Albumin 40/98/1191 3.5  3.5 - 5.2 g/dL Final  . AST 47/82/9562 12  0 - 37 U/L Final  . ALT 10/01/2012 17  0 - 35 U/L Final  . Alkaline Phosphatase 10/01/2012 79  39 - 117 U/L Final  . Total Bilirubin 10/01/2012 0.6  0.3 - 1.2 mg/dL Final  . GFR calc non Af Amer 10/01/2012 >90  >90 mL/min Final  . GFR calc Af Amer 10/01/2012 >90  >90 mL/min Final   Comment: (NOTE)                          The eGFR has been calculated using the CKD EPI equation.                          This calculation has not been validated in all clinical situations.                          eGFR's persistently <90 mL/min signify possible Chronic Kidney                          Disease.  Marland Kitchen Prothrombin Time 10/01/2012 13.3  11.6 - 15.2 seconds Final  . INR 10/01/2012 1.03  0.00 - 1.49 Final  . ABO/RH(D) 10/01/2012 A POS   Final  . Antibody Screen 10/01/2012 NEG   Final  . Sample Expiration 10/01/2012 10/12/2012   Final  . Color, Urine 10/01/2012 YELLOW  YELLOW Final  . APPearance 10/01/2012 CLEAR  CLEAR Final  . Specific Gravity, Urine 10/01/2012 1.026  1.005  - 1.030 Final  . pH 10/01/2012 5.0  5.0 - 8.0 Final  . Glucose, UA 10/01/2012 NEGATIVE  NEGATIVE mg/dL Final  . Hgb urine dipstick 10/01/2012 NEGATIVE  NEGATIVE Final  . Bilirubin Urine 10/01/2012 NEGATIVE  NEGATIVE Final  . Ketones, ur 10/01/2012 NEGATIVE  NEGATIVE mg/dL Final  . Protein, ur 13/08/6576 NEGATIVE  NEGATIVE mg/dL Final  . Urobilinogen, UA 10/01/2012 1.0  0.0 - 1.0 mg/dL Final  . Nitrite 46/96/2952 NEGATIVE  NEGATIVE Final  . Leukocytes, UA 10/01/2012 SMALL* NEGATIVE Final  . ABO/RH(D) 10/01/2012 A POS   Final  . Squamous Epithelial / LPF 10/01/2012 FEW* RARE Final  . WBC, UA 10/01/2012 7-10  <3 WBC/hpf Final  . RBC / HPF 10/01/2012 0-2  <3 RBC/hpf Final  . Bacteria, UA 10/01/2012 FEW* RARE Final  . Urine-Other 10/01/2012 TRICHOMONAS PRESENT   Final  Office Visit on 09/06/2012  Component Date Value Range Status  . Color, UA 09/06/2012 yellow   Final  . Clarity, UA 09/06/2012 clear   Final  . Glucose, UA 09/06/2012 neg   Final  . Bilirubin, UA 09/06/2012 neg   Final  . Ketones, UA 09/06/2012 neg   Final  . Spec Grav, UA 09/06/2012 1.025   Final  . Blood, UA 09/06/2012 neg   Final  . pH, UA 09/06/2012 5.0   Final  . Protein, UA 09/06/2012 trace   Final  . Urobilinogen, UA 09/06/2012 negative   Final  . Nitrite, UA 09/06/2012 neg   Final  . Leukocytes, UA 09/06/2012 Trace   Final  . Cholesterol 09/06/2012 191  0 - 200 mg/dL Final   Comment: ATP III Classification:                                <  200        mg/dL        Desirable                               200 - 239     mg/dL        Borderline High                               >= 240        mg/dL        High                             . Triglycerides 09/06/2012 96  <150 mg/dL Final  . HDL 16/10/9602 35* >39 mg/dL Final  . Total CHOL/HDL Ratio 09/06/2012 5.5   Final  . VLDL 09/06/2012 19  0 - 40 mg/dL Final  . LDL Cholesterol 09/06/2012 137* 0 - 99 mg/dL Final   Comment:                            Total  Cholesterol/HDL Ratio:CHD Risk                                                 Coronary Heart Disease Risk Table                                                                 Men       Women                                   1/2 Average Risk              3.4        3.3                                       Average Risk              5.0        4.4                                    2X Average Risk              9.6        7.1                                    3X Average Risk             23.4       11.0  Use the calculated Patient Ratio above and the CHD Risk table                           to determine the patient's CHD Risk.                          ATP III Classification (LDL):                                < 100        mg/dL         Optimal                               100 - 129     mg/dL         Near or Above Optimal                               130 - 159     mg/dL         Borderline High                               160 - 189     mg/dL         High                                > 190        mg/dL         Very High                             . ALT 09/06/2012 18  0 - 35 U/L Final     X-Rays:Dg Hip Complete Right  10/01/2012   *RADIOLOGY REPORT*  Clinical Data: Right hip osteoarthritis, preoperative film  RIGHT HIP - COMPLETE 2+ VIEW  Comparison: Prior radiographs 11/28/2011  Findings: Advanced secondary degenerative osteoarthritis with near total loss of joint space and bone on bone contact.  Suspect either prior femoral neck fracture or slipped capital femoral epiphyses. There is varus angulation of the hip joint as well as pelvic tilt with the right iliac crest higher than the left.  Left worse than right lower lumbar facet arthropathy.  The left hip is unremarkable.  No acute pelvic abnormality.  Visualized bowel gas pattern is normal.  IMPRESSION: Advanced secondary osteoarthritis of the right hip joint secondary to either remote slipped capital femoral  epiphysis or prior femoral neck fracture.  There is complete loss of joint space with a bone on bone contact and at least modest progression compared to 11/28/2011.  Mild interval progression of left worse than right lower lumbar facet arthropathy and pelvic tilt with the right iliac crest higher than the left.   Original Report Authenticated By: Malachy Moan, M.D.   Dg Pelvis Portable  10/09/2012   CLINICAL DATA:  Postop right total hip replacement.  EXAM: PORTABLE PELVIS  COMPARISON:  10/01/2012  FINDINGS: Changes of right hip replacement and. Normal AP alignment. No visible hardware or bony complicating feature. Soft tissue drain in place.  IMPRESSION: Right  hip replacement. No complicating feature.   Electronically Signed   By: Charlett Nose M.D.   On: 10/09/2012 21:41    EKG:No orders found for this or any previous visit.   Hospital Course: Patient was admitted to [facility] and taken to the OR and underwent the above state procedure without complications.  Patient tolerated the procedure well and was later transferred to the recovery room and then to the orthopaedic floor for postoperative care.  They were given PO and IV analgesics for pain control following their surgery.  They were given 24 hours of postoperative antibiotics of  Anti-infectives   Start     Dose/Rate Route Frequency Ordered Stop   10/10/12 0600  ceFAZolin (ANCEF) IVPB 2 g/50 mL premix     2 g 100 mL/hr over 30 Minutes Intravenous On call to O.R. 10/09/12 1312 10/09/12 1625   10/09/12 2200  ceFAZolin (ANCEF) IVPB 1 g/50 mL premix     1 g 100 mL/hr over 30 Minutes Intravenous Every 6 hours 10/09/12 2031 10/10/12 0520     and started on DVT prophylaxis in the form of Xarelto.   PT and OT were ordered for total hip protocol.  The patient was allowed to be WBAT with therapy. Discharge planning was consulted to help with postop disposition and equipment needs.  Patient had a tough night on the evening of surgery but  doing better the next morning.  They started to get up OOB with therapy on day one.  Hemovac drain was pulled without difficulty.  The knee immobilizer was removed and discontinued.  Continued to work with therapy into day two.  Dressing was changed on day two and the incision was healing well. Patient was seen in rounds and it was felt that she would be ready to go home later that day.  Discharge Medications: Prior to Admission medications   Medication Sig Start Date End Date Taking? Authorizing Provider  rosuvastatin (CRESTOR) 20 MG tablet Take 20 mg by mouth every morning.   Yes Historical Provider, MD  methocarbamol (ROBAXIN) 500 MG tablet Take 1 tablet (500 mg total) by mouth every 6 (six) hours as needed. 10/11/12   Bricia Taher, PA-C  oxyCODONE (OXY IR/ROXICODONE) 5 MG immediate release tablet Take 1-2 tablets (5-10 mg total) by mouth every 3 (three) hours as needed. 10/11/12   Jubal Rademaker Julien Girt, PA-C  rivaroxaban (XARELTO) 10 MG TABS tablet Take 1 tablet (10 mg total) by mouth daily with breakfast. Take Xarelto for two and a half more weeks, then discontinue Xarelto. Once the patient has completed the blood thinner regimen, then take a Baby 81 mg Aspirin daily for four more weeks. 10/11/12   Kattie Santoyo, PA-C  traMADol (ULTRAM) 50 MG tablet Take 1-2 tablets (50-100 mg total) by mouth every 6 (six) hours as needed (mild pain). 10/11/12   Eleyna Brugh Julien Girt, PA-C    Diet: Cardiac diet Activity:WBAT No bending hip over 90 degrees- A "L" Angle Do not cross legs Do not let foot roll inward When turning these patients a pillow should be placed between the patient's legs to prevent crossing. Patients should have the affected knee fully extended when trying to sit or stand from all surfaces to prevent excessive hip flexion. When ambulating and turning toward the affected side the affected leg should have the toes turned out prior to moving the walker and the rest of patient's body  as to prevent internal rotation/ turning in of the leg. Abduction pillows are the most  effective way to prevent a patient from not crossing legs or turning toes in at rest. If an abduction pillow is not ordered placing a regular pillow length wise between the patient's legs is also an effective reminder. It is imperative that these precautions be maintained so that the surgical hip does not dislocate. Follow-up:in 2 weeks Disposition - Home Discharged Condition: good       Discharge Orders   Future Orders Complete By Expires   Call MD / Call 911  As directed    Comments:     If you experience chest pain or shortness of breath, CALL 911 and be transported to the hospital emergency room.  If you develope a fever above 101 F, pus (white drainage) or increased drainage or redness at the wound, or calf pain, call your surgeon's office.   Change dressing  As directed    Comments:     You may change your dressing dressing daily with sterile 4 x 4 inch gauze dressing and paper tape.  Do not submerge the incision under water.   Constipation Prevention  As directed    Comments:     Drink plenty of fluids.  Prune juice may be helpful.  You may use a stool softener, such as Colace (over the counter) 100 mg twice a day.  Use MiraLax (over the counter) for constipation as needed.   Diet general  As directed    Discharge instructions  As directed    Comments:     Pick up stool softner and laxative for home. Do not submerge incision under water. May shower. Continue to use ice for pain and swelling from surgery.  Total Hip Protocol.  Take Xarelto for two and a half more weeks, then discontinue Xarelto. Once the patient has completed the blood thinner regimen, then take a Baby 81 mg Aspirin daily for four more weeks.   Do not sit on low chairs, stoools or toilet seats, as it may be difficult to get up from low surfaces  As directed    Driving restrictions  As directed    Comments:     No driving  until released by the physician.   Follow the hip precautions as taught in Physical Therapy  As directed    Increase activity slowly as tolerated  As directed    Lifting restrictions  As directed    Comments:     No lifting until released by the physician.   Patient may shower  As directed    Comments:     You may shower without a dressing once there is no drainage.  Do not wash over the wound.  If drainage remains, do not shower until drainage stops.   TED hose  As directed    Comments:     Use stockings (TED hose) for 3 weeks on both leg(s).  You may remove them at night for sleeping.   Weight bearing as tolerated  As directed        Medication List    STOP taking these medications       naproxen sodium 220 MG tablet  Commonly known as:  ANAPROX      TAKE these medications       methocarbamol 500 MG tablet  Commonly known as:  ROBAXIN  Take 1 tablet (500 mg total) by mouth every 6 (six) hours as needed.     oxyCODONE 5 MG immediate release tablet  Commonly known as:  Oxy IR/ROXICODONE  Take 1-2  tablets (5-10 mg total) by mouth every 3 (three) hours as needed.     rivaroxaban 10 MG Tabs tablet  Commonly known as:  XARELTO  - Take 1 tablet (10 mg total) by mouth daily with breakfast. Take Xarelto for two and a half more weeks, then discontinue Xarelto.  - Once the patient has completed the blood thinner regimen, then take a Baby 81 mg Aspirin daily for four more weeks.     rosuvastatin 20 MG tablet  Commonly known as:  CRESTOR  Take 20 mg by mouth every morning.     traMADol 50 MG tablet  Commonly known as:  ULTRAM  Take 1-2 tablets (50-100 mg total) by mouth every 6 (six) hours as needed (mild pain).       Follow-up Information   Follow up with Loanne Drilling, MD. Schedule an appointment as soon as possible for a visit on 10/24/2012.   Specialty:  Orthopedic Surgery   Contact information:   [address] [place] [address] [phone]         Signed: Patrica Duel 10/11/2012, 9:03 AM

## 2012-10-11 NOTE — Progress Notes (Signed)
Physical Therapy Treatment Patient Details Name: Alyssa Rangel MRN: [id number] DOB: 1965/10/06 Today's Date: 10/11/2012 Time: 9562-1308 PT Time Calculation (min): 25 min  PT Assessment / Plan / Recommendation  History of Present Illness s/p right posterior THA   PT Comments   PM session assisted pt out of recliner to amb in hallway.  Unable to attempt steps MAX c/o pain/fatigue.  Pt unsteady.  Poss D/C today but pt NOT performing well enough to be considered a safe D/C.     Follow Up Recommendations  Home health PT;Supervision for mobility/OOB     Does the patient have the potential to tolerate intense rehabilitation     Barriers to Discharge        Equipment Recommendations       Recommendations for Other Services    Frequency 7X/week   Progress towards PT Goals Progress towards PT goals: Progressing toward goals  Plan      Precautions / Restrictions Precautions Precautions: Fall;Posterior Hip Precaution Comments: pt recalls 2/3 THP Restrictions Weight Bearing Restrictions: No Other Position/Activity Restrictions: WBAT    Pertinent Vitals/Pain C/o 9/10 Almost time for meds    Mobility  Bed Mobility Bed Mobility: Sit to Supine Supine to Sit: 4: Min guard;4: Min assist Sit to Supine: 4: Min assist Details for Bed Mobility Assistance: min assist to support R LE back into bed Transfers Transfers: Sit to Stand;Stand to Sit Sit to Stand: 5: Supervision;4: Min guard;From chair/3-in-1;From toilet Stand to Sit: 5: Supervision;4: Min guard;To bed;To toilet Details for Transfer Assistance: 25% VC's on proper tech and hand placement Ambulation/Gait Ambulation/Gait Assistance: 4: Min guard;4: Min assist Ambulation Distance (Feet): 45 Feet Assistive device: Rolling walker Ambulation/Gait Assistance Details: 25% VC's on proper upright posture Gait Pattern: Step-to pattern;Decreased stance time - right Gait velocity: decreased    PT Goals (current goals can now be  found in the care plan section) Acute Rehab PT Goals Patient Stated Goal: to be independent  Visit Information  Last PT Received On: 10/11/12 Assistance Needed: +1 History of Present Illness: s/p right posterior THA    Subjective Data  Patient Stated Goal: to be independent   Cognition  Cognition Arousal/Alertness: Awake/alert Behavior During Therapy: WFL for tasks assessed/performed Overall Cognitive Status: Within Functional Limits for tasks assessed    Balance  Balance Balance Assessed: Yes Dynamic Standing Balance Dynamic Standing - Level of Assistance: 4: Min assist  End of Session PT - End of Session Equipment Utilized During Treatment: Gait belt Activity Tolerance: Patient tolerated treatment well Patient left: in bed;with call bell/phone within reach   Felecia Shelling  PTA [facility]  Acute  Rehab Pager      [phone]

## 2012-10-11 NOTE — Evaluation (Signed)
Occupational Therapy Evaluation Patient Details Name: Alyssa Rangel MRN: [id number] DOB: 17-Jan-1965 Today's Date: 10/11/2012 Time: 4098-1191 OT Time Calculation (min): 42 min  OT Assessment / Plan / Recommendation History of present illness s/p right posterior THA   Clinical Impression   Pt doing well but was a little dizzy at commode when she first stood up. Sat back down and rested for about 5 minutes and felt better. Nursing made aware. Will benefit from skilled OT services to maximize ADL independence for d/c home with family.    OT Assessment  Patient needs continued OT Services    Follow Up Recommendations  Home health OT;Supervision/Assistance - 24 hour    Barriers to Discharge      Equipment Recommendations  3 in 1 bedside comode (already in room)    Recommendations for Other Services    Frequency  Min 2X/week    Precautions / Restrictions Precautions Precautions: Fall;Posterior Hip Restrictions Weight Bearing Restrictions: No Other Position/Activity Restrictions: WBAT   Pertinent Vitals/Pain 8/10 with activity; nursing made aware. Ice placed, rest.    ADL  Eating/Feeding: Simulated;Independent Where Assessed - Eating/Feeding: Chair Grooming: Performed;Wash/dry hands;Minimal assistance Where Assessed - Grooming: Unsupported standing Upper Body Bathing: Simulated;Chest;Right arm;Left arm;Abdomen;Set up Where Assessed - Upper Body Bathing: Unsupported sitting Lower Body Bathing: Simulated;Moderate assistance (without AE) Where Assessed - Lower Body Bathing: Supported sit to stand Upper Body Dressing: Simulated;Set up Where Assessed - Upper Body Dressing: Unsupported sitting Lower Body Dressing: Simulated;Maximal assistance (without AE) Where Assessed - Lower Body Dressing: Supported sit to stand Toilet Transfer: Performed;Minimal Web designer: Raised toilet seat with arms (or 3-in-1 over toilet) Toileting - Clothing Manipulation  and Hygiene: Performed;Minimal assistance Where Assessed - Engineer, mining and Hygiene: Standing Equipment Used: Long-handled shoe horn;Long-handled sponge;Reacher;Rolling walker;Sock aid ADL Comments: Educated on all AE and pt practiced with donning sock wtih sock aid with setup/supervision. Discussed other AE for LB self care and pt states her mother will assist with LB self care but she may decide to purchase AE later. Discussed tub/shower and pt states she is agreeable to let HHOt assess tub transfer when she is ready. Currently pt with too much pain to step over tub and also feeling a little dizzy. Nursing made aware. Pt sat for about 5 minutes on commode as she started to feel dizzy when she stood to perform hygiene. So she sat back down and rested an felt better after about 5 minutes. BP on commode 161/83.  Pt able to state 2/3 precautions. Reviewed all with pt.    OT Diagnosis: Generalized weakness  OT Problem List: Decreased strength;Decreased knowledge of use of DME or AE;Decreased knowledge of precautions;Pain OT Treatment Interventions: Self-care/ADL training;DME and/or AE instruction;Therapeutic activities;Patient/family education   OT Goals(Current goals can be found in the care plan section) Acute Rehab OT Goals Patient Stated Goal: to be independent OT Goal Formulation: With patient Time For Goal Achievement: 10/18/12 Potential to Achieve Goals: Good  Visit Information  Last OT Received On: 10/11/12 Assistance Needed: +1 History of Present Illness: s/p right posterior THA       Prior Functioning     Home Living Family/patient expects to be discharged to:: Private residence Living Arrangements: Parent Type of Home: House Home Access: Stairs to enter Secretary/administrator of Steps: 4 Entrance Stairs-Rails: Right Home Layout: One level Home Equipment: None Prior Function Level of Independence: Independent Communication Communication: No difficulties          Vision/Perception  Cognition  Cognition Arousal/Alertness: Awake/alert Behavior During Therapy: WFL for tasks assessed/performed Overall Cognitive Status: Within Functional Limits for tasks assessed    Extremity/Trunk Assessment Upper Extremity Assessment Upper Extremity Assessment: Overall WFL for tasks assessed     Mobility Transfers Transfers: Sit to Stand;Stand to Sit Sit to Stand: 4: Min guard;With upper extremity assist;From chair/3-in-1 Stand to Sit: 4: Min assist;With upper extremity assist;To chair/3-in-1 Details for Transfer Assistance: verbal cues for THP     Exercise     Balance Balance Balance Assessed: Yes Dynamic Standing Balance Dynamic Standing - Level of Assistance: 4: Min assist   End of Session OT - End of Session Equipment Utilized During Treatment: Rolling walker Activity Tolerance: Patient limited by pain;Other (comment) (some dizziness) Patient left: in chair;with call bell/phone within reach  GO     Lennox Laity [phone] 10/11/2012, 11:18 AM

## 2012-10-11 NOTE — Progress Notes (Signed)
Physical Therapy Treatment Patient Details Name: Alyssa Rangel MRN: [id number] DOB: 04-Jun-1965 Today's Date: 10/11/2012 Time: 1308-6578 PT Time Calculation (min): 25 min  PT Assessment / Plan / Recommendation  History of Present Illness s/p right posterior THA   PT Comments   POD # 2 am session.  Assisted pt OOB with increased time then amb in hallway.    Follow Up Recommendations  Home health PT;Supervision for mobility/OOB     Does the patient have the potential to tolerate intense rehabilitation     Barriers to Discharge        Equipment Recommendations       Recommendations for Other Services    Frequency 7X/week   Progress towards PT Goals Progress towards PT goals: Progressing toward goals  Plan      Precautions / Restrictions Precautions Precautions: Fall;Posterior Hip Precaution Comments: pt recalls 2/3 THP Restrictions Weight Bearing Restrictions: No Other Position/Activity Restrictions: WBAT    Pertinent Vitals/Pain C/o "soreness"   "tightness"    Mobility  Bed Mobility Bed Mobility: Supine to Sit Supine to Sit: 4: Min guard;4: Min assist Details for Bed Mobility Assistance: min asssit to support R LE off bed and increased time Transfers Transfers: Sit to Stand;Stand to Sit Sit to Stand: 5: Supervision;4: Min guard;From bed Stand to Sit: 5: Supervision;4: Min guard;To chair/3-in-1 Details for Transfer Assistance: 25% VC's on proper tech and hand placement Ambulation/Gait Ambulation/Gait Assistance: 4: Min guard;4: Min assist Ambulation Distance (Feet): 65 Feet Assistive device: Rolling walker Ambulation/Gait Assistance Details: increased time and 25% VC's safety with turns Gait Pattern: Step-to pattern;Decreased stance time - right Gait velocity: decreased     PT Goals (current goals can now be found in the care plan section) Acute Rehab PT Goals Patient Stated Goal: to be independent  Visit Information  Last PT Received On:  10/11/12 Assistance Needed: +1 History of Present Illness: s/p right posterior THA    Subjective Data  Patient Stated Goal: to be independent   Cognition  Cognition Arousal/Alertness: Awake/alert Behavior During Therapy: WFL for tasks assessed/performed Overall Cognitive Status: Within Functional Limits for tasks assessed    Balance  Balance Balance Assessed: Yes Dynamic Standing Balance Dynamic Standing - Level of Assistance: 4: Min assist  End of Session PT - End of Session Equipment Utilized During Treatment: Gait belt Activity Tolerance: Patient tolerated treatment well Patient left: in chair;with call bell/phone within reach   Felecia Shelling  PTA [facility]  Acute  Rehab Pager      [phone]

## 2012-10-12 LAB — CBC
HCT: 28.5 % — ABNORMAL LOW (ref 36.0–46.0)
MCHC: 32.3 g/dL (ref 30.0–36.0)
MCV: 82.6 fL (ref 78.0–100.0)
Platelets: 277 10*3/uL (ref 150–400)
RBC: 3.45 MIL/uL — ABNORMAL LOW (ref 3.87–5.11)
RDW: 13.9 % (ref 11.5–15.5)
WBC: 13.2 10*3/uL — ABNORMAL HIGH (ref 4.0–10.5)

## 2012-10-12 NOTE — Progress Notes (Signed)
Physical Therapy Treatment Patient Details Name: GERALDYN SHAIN MRN: [id number] DOB: 15-Oct-1965 Today's Date: 10/12/2012 Time: 4696-2952 PT Time Calculation (min): 12 min  PT Assessment / Plan / Recommendation  History of Present Illness s/p right posterior THA   PT Comments   Will ambulate after next medication given at 1230 qnd see how safe gait is. Recommend delay DC until tomorrow to ensure safe ambulation and safe negotiation of steps.  Follow Up Recommendations  Home health PT;Supervision for mobility/OOB     Does the patient have the potential to tolerate intense rehabilitation     Barriers to Discharge        Equipment Recommendations  Rolling walker with 5" wheels    Recommendations for Other Services    Frequency     Progress towards PT Goals Progress towards PT goals: Not progressing toward goals - comment (having decreased support RLE in WB)  Plan Current plan remains appropriate    Precautions / Restrictions Precautions Precautions: Fall;Posterior Hip Precaution Comments: pt recalls 3/3 THP   Pertinent Vitals/Pain Less pain with exercises.    Mobility       Exercises Total Joint Exercises Quad Sets: AROM;10 reps;Strengthening;Right;Supine Gluteal Sets: Both;AROM;Strengthening;10 reps;Supine Short Arc Quad: AROM;Right;10 reps;Supine Heel Slides: AAROM;Right;10 reps;Supine Hip ABduction/ADduction: AAROM;Right;10 reps;Supine   PT Diagnosis:    PT Problem List:   PT Treatment Interventions:     PT Goals (current goals can now be found in the care plan section)    Visit Information  Last PT Received On: 10/12/12 Assistance Needed: +2 History of Present Illness: s/p right posterior THA    Subjective Data      Cognition  Cognition Arousal/Alertness: Awake/alert    Balance  Dynamic Standing Balance Dynamic Standing - Level of Assistance: 3: Mod assist  End of Session PT - End of Session Activity Tolerance: Patient tolerated treatment  well Patient left: in bed;with call bell/phone within reach;with family/visitor present Nurse Communication: Mobility status (incr. HR to 127, decreased safety walking.)   GP     Rada Hay 10/12/2012, 1:08 PM

## 2012-10-12 NOTE — Progress Notes (Signed)
Physical Therapy Treatment Patient Details Name: Alyssa Rangel MRN: [id number] DOB: 1965-04-07 Today's Date: 10/12/2012 Time: 4098-1191 PT Time Calculation (min): 35 min  PT Assessment / Plan / Recommendation  History of Present Illness s/p right posterior THA   PT Comments   Pt continues with dizziness when ambulating. Pt had taken pain meds 45 " prior to walking. HR 127, BP150/76 . Pt reports feeling  A stick at inferior end of icision, Very tender when palpated, RN aware.  Follow Up Recommendations  Home health PT;Supervision for mobility/OOB     Does the patient have the potential to tolerate intense rehabilitation     Barriers to Discharge        Equipment Recommendations  Rolling walker with 5" wheels    Recommendations for Other Services    Frequency 7X/week   Progress towards PT Goals Progress towards PT goals: Progressing toward goals  Plan Current plan remains appropriate    Precautions / Restrictions Precautions Precautions: Fall;Posterior Hip Precaution Comments: pt recalls 3/3 THP   Pertinent Vitals/Pain 4 R hip. Feels a "stick" in incision    Mobility  Bed Mobility Bed Mobility: Supine to Sit Supine to Sit: 4: Min guard;HOB elevated Details for Bed Mobility Assistance: verbal cues on using sheet to assist getting RLE over to edge of bed.m cues for precautions. Transfers Sit to Stand: 4: Min assist;From chair/3-in-1;With upper extremity assist;With armrests;From bed Stand to Sit: To chair/3-in-1;With upper extremity assist;4: Min guard Stand Pivot Transfers: 4: Min assist Details for Transfer Assistance: extra time for transition into standing  due to c.o pain and noted R hip/thigh quivering, decreased weight bear on RLE. Ambulation/Gait Ambulation/Gait Assistance: 4: Min assist;1: +2 Total assist Ambulation/Gait: Patient Percentage: 80% Ambulation Distance (Feet): 40 Feet (+ 20) Assistive device: Rolling walker Ambulation/Gait Assistance Details:   Pt c/o dizziness after standing from bed and ambulating xx 10 ft. Became unsteady and rquired steady assist. Pt continued to amb to BR. Requested to sit down after another 40' due to dizziness. Gait Pattern: Step-to pattern;Decreased stance time - right;Step-through pattern;Antalgic Gait velocity: decreased    Exercises Total Joint Exercises Quad Sets: AROM;10 reps;Strengthening;Right;Supine Gluteal Sets: Both;AROM;Strengthening;10 reps;Supine Short Arc Quad: AROM;Right;10 reps;Supine Heel Slides: AAROM;Right;10 reps;Supine Hip ABduction/ADduction: AAROM;Right;10 reps;Supine   PT Diagnosis:    PT Problem List:   PT Treatment Interventions:     PT Goals (current goals can now be found in the care plan section)    Visit Information  Last PT Received On: 10/12/12 Assistance Needed: +2 History of Present Illness: s/p right posterior THA    Subjective Data      Cognition  Cognition Arousal/Alertness: Awake/alert (drowsy)    Balance  Balance Balance Assessed: Yes Dynamic Standing Balance Dynamic Standing - Level of Assistance: 3: Mod assist  End of Session PT - End of Session Equipment Utilized During Treatment: Gait belt Activity Tolerance: Patient limited by fatigue Patient left: with call bell/phone within reach;with family/visitor present;in chair Nurse Communication: Mobility status (increased HR, pt reports feeling something sticking)   GP     Rada Hay 10/12/2012, 2:07 PM

## 2012-10-12 NOTE — Progress Notes (Signed)
Subjective: 3 Days Post-Op Procedure(s) (LRB): RIGHT TOTAL HIP ARTHROPLASTY (Right) Patient reports pain as mild.  Some difficulty controlling pain overnight but comfortable now.  Tolerating a regular diet.  No n/v.  Objective: Vital signs in last 24 hours: Temp:  [98.4 F (36.9 C)-98.7 F (37.1 C)] 98.5 F (36.9 C) (10/04 0520) Pulse Rate:  [84-95] 95 (10/04 0520) Resp:  [16-20] 20 (10/04 0520) BP: (110-161)/(74-83) 110/74 mmHg (10/04 0520) SpO2:  [97 %-100 %] 97 % (10/04 0520)  Intake/Output from previous day: 10/03 0701 - 10/04 0700 In: 972 [P.O.:720; I.V.:252] Out: 1100 [Urine:1100] Intake/Output this shift:     Recent Labs  10/10/12 0420 10/11/12 0449 10/12/12 0445  HGB 10.2* 9.9* 9.2*    Recent Labs  10/11/12 0449 10/12/12 0445  WBC 15.3* 13.2*  RBC 3.70* 3.45*  HCT 30.3* 28.5*  PLT 305 277    Recent Labs  10/10/12 0420 10/11/12 0449  NA 130* 135  K 4.2 4.2  CL 99 102  CO2 23 26  BUN 10 7  CREATININE 0.66 0.53  GLUCOSE 211* 200*  CALCIUM 8.8 8.7   No results found for this basename: LABPT, INR,  in the last 72 hours  R hip wound dressed and dry.  NVI at R LE.  Assessment/Plan: 3 Days Post-Op Procedure(s) (LRB): RIGHT TOTAL HIP ARTHROPLASTY (Right) Continue PT.  Plan d/c home today.  Alyssa Rangel 10/12/2012, 7:50 AM

## 2012-10-12 NOTE — Progress Notes (Signed)
Physical Therapy Treatment Patient Details Name: SARAH BAEZ MRN: [id number] DOB: Aug 01, 1965 Today's Date: 10/12/2012 Time: 0825-0852 PT Time Calculation (min): 27 min  PT Assessment / Plan / Recommendation  History of Present Illness s/p right posterior THA   PT Comments   Pt having increased  Difficulty with weight bearing on RLE today. Pt fatigued and dizzy after ambulating 45 ft and practicing 2 steps. Pt is not functioning safely at this time for  DC. Will see again for exercises and ambulation/monitor VS. BP after ambulation-141/76 HR 127, decr to 120 after rest Sats 97% RA.  RN aware.  Follow Up Recommendations  Home health PT;Supervision for mobility/OOB     Does the patient have the potential to tolerate intense rehabilitation     Barriers to Discharge        Equipment Recommendations  Rolling walker with 5" wheels    Recommendations for Other Services    Frequency     Progress towards PT Goals Progress towards PT goals: Not progressing toward goals - comment (pt having more difficulty with weight on RLE, incr. fatigue/)  Plan Current plan remains appropriate    Precautions / Restrictions Precautions Precautions: Fall;Posterior Hip Precaution Comments: pt recalls 2/3 THP Restrictions Other Position/Activity Restrictions: WBAT   Pertinent Vitals/Pain     Mobility  Transfers Sit to Stand: 4: Min assist;From chair/3-in-1;With upper extremity assist;With armrests Stand to Sit: To chair/3-in-1;With upper extremity assist;With armrests Details for Transfer Assistance: extra time for transition into standing  due to c.o pain and noted R hip/thigh quivering, decreased weight bear on RLE. Ambulation/Gait Ambulation/Gait Assistance: 3: Mod assist Ambulation Distance (Feet): 45 Feet Assistive device: Rolling walker Ambulation/Gait Assistance Details: pt had decreased support of RLE during stance, noted Thigh quivering  during stance. At times required support  on R axilla due to RLE tended to collapse. Muliti modal cues for sequence and posture inside RW. Gait Pattern: Step-to pattern;Decreased stance time - right Gait velocity: decreased Stairs: Yes Stairs Assistance: 1: +2 Total assist Stairs Assistance Details (indicate cue type and reason): pt required support under R axilla when stepping on Rle going up and down steps.Pt required significant assistnace for safe negotiation of steps  Stair Management Technique: One rail Right;Step to pattern;With cane Number of Stairs: 2    Exercises     PT Diagnosis:    PT Problem List:   PT Treatment Interventions:     PT Goals (current goals can now be found in the care plan section)    Visit Information  Last PT Received On: 10/12/12 Assistance Needed: +2 History of Present Illness: s/p right posterior THA    Subjective Data      Cognition  Cognition Arousal/Alertness: Awake/alert    Balance  Dynamic Standing Balance Dynamic Standing - Level of Assistance: 3: Mod assist  End of Session PT - End of Session Activity Tolerance: Patient limited by fatigue;Patient limited by pain Patient left: in chair;with call bell/phone within reach Nurse Communication: Mobility status (incr. HR to 127, decreased safety walking.)   GP     Rada Hay 10/12/2012, 9:13 AM Blanchard Kelch PT [phone]

## 2012-10-12 NOTE — Progress Notes (Signed)
Pt not met PT goals yet. PA [employer] notified. Alyssa Rangel, [employer]

## 2012-10-13 NOTE — Progress Notes (Addendum)
Physical Therapy Treatment Patient Details Name: SAMAR VENNEMAN MRN: [id number] DOB: 01/10/1966 Today's Date: 10/13/2012 Time: 4098-1191 PT Time Calculation (min): 56 min  PT Assessment / Plan / Recommendation  History of Present Illness s/p right posterior THA   PT Comments   Pt's HR increased to 141 after ambulating x 40'. Pt with incr. RR and reports feeling very weak. RN aware. MD notified. DC delayed today. Continue again and monitor VS  Follow Up Recommendations  Home health PT;Supervision for mobility/OOB     Does the patient have the potential to tolerate intense rehabilitation     Barriers to Discharge        Equipment Recommendations  Rolling walker with 5" wheels    Recommendations for Other Services    Frequency 7X/week   Progress towards PT Goals Progress towards PT goals: Progressing toward goals (due to medical issues)  Plan Current plan remains appropriate    Precautions / Restrictions Precautions Precautions: Fall;Posterior Hip Precaution Comments: increased HR Restrictions Weight Bearing Restrictions: No   Pertinent Vitals/Pain 4 R hip  HR 115-141 sats 97% BP 115/71  BP after return to bed 110/70 HR117.   Mobility  Bed Mobility Sit to Supine: 5: Set up;4: Min guard Details for Bed Mobility Assistance: pt used Leg lifter to place RLE onto bed. Transfers Sit to Stand: 4: Min assist;From chair/3-in-1;With upper extremity assist;With armrests Stand to Sit: To chair/3-in-1;With upper extremity assist;4: Min guard;To bed Details for Transfer Assistance: extra time for transition into standing  due to c.o pain and noted R hip/thigh quivering, decreased weight bear on RLE. Pt noted to be panting. Ambulation/Gait Ambulation/Gait Assistance: 1: +2 Total assist Ambulation/Gait: Patient Percentage: 80% Ambulation Distance (Feet):  (and 20 ft.) Assistive device: Rolling walker Ambulation/Gait Assistance Details: cues for position inside RW. Pt noted to  have increased RR and c/o feeling weak. Cahir brought up. VS taken. RN called into room. Gait Pattern: Step-to pattern;Decreased stance time - right;Step-through pattern;Antalgic Gait velocity: decreased General Gait Details: R leg continues to quiver during stance. At times tends to buckle under pt. Stairs Assistance Details (indicate cue type and reason): unable due to uncr. HR 140's    Exercises Total Joint Exercises Quad Sets: AROM;10 reps;Strengthening;Right;Supine Gluteal Sets: Both;AROM;Strengthening;10 reps;Supine Short Arc Quad: AROM;Right;10 reps;Supine Heel Slides: AAROM;Right;10 reps;Supine   PT Diagnosis:    PT Problem List:   PT Treatment Interventions:     PT Goals (current goals can now be found in the care plan section)    Visit Information  Last PT Received On: 10/13/12 Assistance Needed: +2 History of Present Illness: s/p right posterior THA    Subjective Data      Cognition  Cognition Arousal/Alertness: Awake/alert    Balance     End of Session PT - End of Session Activity Tolerance: Patient limited by fatigue Patient left: with call bell/phone within reach;with family/visitor present;in bed Nurse Communication: Mobility status (HR)   GP     Rada Hay 10/13/2012, 11:13 AM Blanchard Kelch PT [phone]

## 2012-10-13 NOTE — Progress Notes (Signed)
Pt up with PT when HR=141 & pt only able to walk approx 6-10 feet before too tired to continue. BP wnl, HR=119 after back to bed. PA [employer] notified by phone, info acknowledged. Stated pt should have EKG if she has sustained HR over 120. Hae Ahlers, [employer]

## 2012-10-13 NOTE — Progress Notes (Signed)
   Subjective: 4 Days Post-Op Procedure(s) (LRB): RIGHT TOTAL HIP ARTHROPLASTY (Right)  Mild pain to right hip Had issues with dizziness and unable to complete therapy yesterday Denies any new symptoms or issues Patient reports pain as mild.  Objective:   VITALS:   Filed Vitals:   10/13/12 0535  BP: 123/78  Pulse: 98  Temp: 97.7 F (36.5 C)  Resp: 16    Right hip incision healing well nv intact distally No rashes or edema  LABS  Recent Labs  10/11/12 0449 10/12/12 0445  HGB 9.9* 9.2*  HCT 30.3* 28.5*  WBC 15.3* 13.2*  PLT 305 277     Recent Labs  10/11/12 0449  NA 135  K 4.2  BUN 7  CREATININE 0.53  GLUCOSE 200*     Assessment/Plan: 4 Days Post-Op Procedure(s) (LRB): RIGHT TOTAL HIP ARTHROPLASTY (Right)  PT/OT today do work on stairs and walking Plan for d/c later today F/u in 2 weeks at the office    Alphonsa Overall, MPAS, PA-C  10/13/2012, 7:34 AM

## 2012-10-13 NOTE — Progress Notes (Signed)
Occupational Therapy Treatment Patient Details Name: Alyssa Rangel MRN: [id number] DOB: November 17, 1965 Today's Date: 10/13/2012 Time: 7829-5621 OT Time Calculation (min): 49 min  OT Assessment / Plan / Recommendation  History of present illness s/p right posterior THA   OT comments  Pt is doing well with functional transfers - requiring min guard assist to min A.  She reports she will have assist with LB ADLs at home.  She has been instructed in use of DME and safety with tub transfers.  HR 126 during session; pain 10/10 at end of session - RN made aware.   Follow Up Recommendations  Home health OT;Supervision/Assistance - 24 hour    Barriers to Discharge       Equipment Recommendations  3 in 1 bedside comode    Recommendations for Other Services    Frequency Min 2X/week   Progress towards OT Goals Progress towards OT goals: Progressing toward goals  Plan Discharge plan remains appropriate    Precautions / Restrictions Precautions Precautions: Fall;Posterior Hip Precaution Comments: increased HR Restrictions Other Position/Activity Restrictions: WBAT   Pertinent Vitals/Pain     ADL  Grooming: Wash/dry hands;Min guard Where Assessed - Grooming: Unsupported standing Toilet Transfer: Minimal assistance Toilet Transfer Method: Sit to stand;Stand pivot Toilet Transfer Equipment: Raised toilet seat with arms (or 3-in-1 over toilet);Grab bars;Comfort height toilet Toileting - Clothing Manipulation and Hygiene: Min guard Where Assessed - Engineer, mining and Hygiene: Standing Tub/Shower Transfer: Minimal assistance Tub/Shower Transfer Method: Science writer: Shower seat with Scientist, physiological Used: Rolling walker Transfers/Ambulation Related to ADLs: min guard assist ADL Comments: Pt instructed in options for tub DME and was instructed in proper technique for transfer.  Pt attempted toilet transfer onto comfort height  commode using grab bars, but resulted in increased pain - recommend use of 3in1 with trash can under feet at 3in1 is too high for pt even on lowest setting     OT Diagnosis:    OT Problem List:   OT Treatment Interventions:     OT Goals(current goals can now be found in the care plan section) Acute Rehab OT Goals Patient Stated Goal: to be independent OT Goal Formulation: With patient Time For Goal Achievement: 10/18/12 Potential to Achieve Goals: Good ADL Goals Pt Will Transfer to Toilet: with supervision;ambulating;bedside commode Pt Will Perform Toileting - Clothing Manipulation and hygiene: with supervision;sit to/from stand Additional ADL Goal #1: Pt will state all hip precautions wtih independence.  Visit Information  Last OT Received On: 10/13/12 Assistance Needed: +1 History of Present Illness: s/p right posterior THA    Subjective Data      Prior Functioning       Cognition  Cognition Arousal/Alertness: Awake/alert Behavior During Therapy: WFL for tasks assessed/performed Overall Cognitive Status: Within Functional Limits for tasks assessed    Mobility  Bed Mobility Bed Mobility: Supine to Sit;Sitting - Scoot to Edge of Bed;Sit to Supine Supine to Sit: 5: Supervision;With rails;HOB flat Sitting - Scoot to Edge of Bed: 5: Supervision Sit to Supine: 4: Min assist;With rail;HOB flat (due to pain) Details for Bed Mobility Assistance: Uses leg lifter for Rt LE Transfers Transfers: Sit to Stand;Stand to Sit Sit to Stand: 4: Min guard;With upper extremity assist;From bed;From chair/3-in-1 Stand to Sit: 4: Min guard;With upper extremity assist;To bed;To chair/3-in-1 Details for Transfer Assistance: increased time    Exercises  Total Joint Exercises Quad Sets: AROM;10 reps;Strengthening;Right;Supine Gluteal Sets: Both;AROM;Strengthening;10 reps;Supine Short Arc Quad: AROM;Right;10 reps;Supine Heel Slides: AAROM;Right;10  reps;Supine   Balance     End of Session  OT - End of Session Equipment Utilized During Treatment: Rolling walker Activity Tolerance: Patient tolerated treatment well Patient left: in bed;with call bell/phone within reach;with family/visitor present Nurse Communication: Patient requests pain meds  GO     Malavika Lira, Ursula Alert M 10/13/2012, 1:10 PM

## 2012-10-14 NOTE — Progress Notes (Addendum)
Occupational Therapy Treatment Patient Details Name: Alyssa Rangel MRN: [id number] DOB: [birth year] Today's Date: 10/14/2012 Time: 4782-9562 OT Time Calculation (min): 31 min  OT Assessment / Plan / Recommendation  History of present illness s/p right posterior THA   OT comments  Pt with some fatigue during and after toilet transfer. HR during activity 124. Nursing made aware. Down to 117 with rest. Educated pt on energy conservation including taking rest breaks PRN. Will benefit from [facility] at d/c. Pt able to state 2/3 of her hip precautions today.   Follow Up Recommendations  Home health OT;Supervision/Assistance - 24 hour    Barriers to Discharge       Equipment Recommendations  3 in 1 bedside comode (in room)    Recommendations for Other Services    Frequency Min 2X/week   Progress towards OT Goals Progress towards OT goals: Progressing toward goals  Plan Discharge plan remains appropriate    Precautions / Restrictions Precautions Precautions: Fall;Posterior Hip Precaution Comments: increased HR Restrictions Weight Bearing Restrictions: No Other Position/Activity Restrictions: WBAT   Pertinent Vitals/Pain See above; 710 with activity. 4/10 at rest. Reposition.    ADL  Toilet Transfer: Performed;Minimal Web designer: Raised toilet seat with arms (or 3-in-1 over toilet) Toileting - Clothing Manipulation and Hygiene: Performed;Min guard Where Assessed - Toileting Clothing Manipulation and Hygiene: Sit to stand from 3-in-1 or toilet ADL Comments: Pt states her sister probably has a tubseat she can borrow and discussed letting HHOT assess tub transfer with her. pt agreeable. She still fatigues with toileting today and HR 124 with activity. Nursing made aware. Down to 117 with rest. Advisted pt she would beneit from sponge bathing initially due to fatigue level for safety. She plans to have assist with LB ADL.    OT Diagnosis:    OT Problem List:    OT Treatment Interventions:     OT Goals(current goals can now be found in the care plan section)    Visit Information  Last OT Received On: 10/14/12 History of Present Illness: s/p right posterior THA    Subjective Data      Prior Functioning       Cognition  Cognition Arousal/Alertness: Awake/alert Behavior During Therapy: WFL for tasks assessed/performed Overall Cognitive Status: Within Functional Limits for tasks assessed    Mobility  Transfers Transfers: Sit to Stand;Stand to Sit Sit to Stand: 4: Min guard;With upper extremity assist;From chair/3-in-1 Stand to Sit: 4: Min assist;With upper extremity assist;To chair/3-in-1 Details for Transfer Assistance: increased time    Exercises      Balance     End of Session OT - End of Session Equipment Utilized During Treatment: Rolling walker Activity Tolerance: Patient limited by fatigue Patient left: in chair;with call bell/phone within reach  GO     Lennox Laity [phone] 10/14/2012, 9:38 AM

## 2012-10-14 NOTE — Progress Notes (Signed)
   Subjective: 5 Days Post-Op Procedure(s) (LRB): RIGHT TOTAL HIP ARTHROPLASTY (Right) Patient reports pain as moderate.   Plan is to go Home after hospital stay. Did not report chest pain or shortness of breath with her tachycardia yesterday  Objective: Vital signs in last 24 hours: Temp:  [98.4 F (36.9 C)-99.7 F (37.6 C)] 98.4 F (36.9 C) (10/06 0355) Pulse Rate:  [103-113] 112 (10/06 0355) Resp:  [16-20] 20 (10/06 0355) BP: (126-151)/(61-84) 151/84 mmHg (10/06 0355) SpO2:  [94 %-99 %] 99 % (10/06 0355)  Intake/Output from previous day:  Intake/Output Summary (Last 24 hours) at 10/14/12 0709 Last data filed at 10/14/12 0436  Gross per 24 hour  Intake   1080 ml  Output   1500 ml  Net   -420 ml    Intake/Output this shift:    Labs:  Recent Labs  10/12/12 0445  HGB 9.2*    Recent Labs  10/12/12 0445  WBC 13.2*  RBC 3.45*  HCT 28.5*  PLT 277   No results found for this basename: NA, K, CL, CO2, BUN, CREATININE, GLUCOSE, CALCIUM,  in the last 72 hours No results found for this basename: LABPT, INR,  in the last 72 hours  EXAM General - Patient is Alert, Appropriate and Oriented Extremity - Neurologically intact Neurovascular intact Incision: dressing C/D/I No cellulitis present Compartment soft Dressing/Incision - clean, dry, no drainage Motor Function - intact, moving foot and toes well on exam.   Past Medical History  Diagnosis Date  . Hip pain     pt states no cartilage in right hip very painful  . Hearing loss   . Routine gynecological examination     Dr. Osborn Coho, [facility]  . Hypertension     due to diet pills in the past, never on meds; normotensive as of 7/14  . Hypercholesterolemia     prior medication therapy  . Rheumatoid arthritis(714.0)     patient reported history  . 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 Polyarthralgia   . Anemia   . History of body piercing     tongue ring      Assessment/Plan: 5 Days Post-Op Procedure(s) (LRB): RIGHT TOTAL HIP ARTHROPLASTY (Right) Principal Problem:   OA (osteoarthritis) of hip Active Problems:   Hyponatremia   Discharge home with home health  DVT Prophylaxis - Xarelto Weight Bearing As Tolerated right Leg  Diamond Jentz V 10/14/2012, 7:09 AM

## 2012-10-14 NOTE — Progress Notes (Signed)
Physical Therapy Treatment Patient Details Name: BEAULAH ROMANEK MRN: [id number] DOB: 01/25/1965 Today's Date: 10/14/2012 Time: 0865-7846 PT Time Calculation (min): 9 min  PT Assessment / Plan / Recommendation  History of Present Illness s/p right posterior THA   PT Comments   Pt is for DC today.  Follow Up Recommendations  Home health PT;Supervision for mobility/OOB     Does the patient have the potential to tolerate intense rehabilitation     Barriers to Discharge        Equipment Recommendations  Rolling walker with 5" wheels    Recommendations for Other Services    Frequency 7X/week   Progress towards PT Goals Progress towards PT goals: Progressing toward goals  Plan Current plan remains appropriate    Precautions / Restrictions Precautions Precautions: Fall;Posterior Hip Precaution Comments: increased HR Restrictions Weight Bearing Restrictions: No Other Position/Activity Restrictions: WBAT   Pertinent Vitals/Pain 7 l hip, just medicated.   Mobility  Pt reports she just got back in bed from going to [facility], Stairs:  [employer] Assistance Details (indicate cue type and reason): Provided handout and reviewed with pt on going up steps with crutch and rail.Pt verbalized  x 2 to therapist the procedure. Pt's sister not here yet to instruct.  Stair Management Technique: One rail Right;Step to pattern;With crutches Number of Stairs: 2    Exercises     PT Diagnosis:    PT Problem List:   PT Treatment Interventions:     PT Goals (current goals can now be found in the care plan section)    Visit Information  Last PT Received On: 10/14/12 Assistance Needed: +1 History of Present Illness: s/p right posterior THA    Subjective Data      Cognition  Cognition Arousal/Alertness: Awake/alert Behavior During Therapy: WFL for tasks assessed/performed Overall Cognitive Status: Within Functional Limits for tasks assessed    Balance     End of  Session PT - End of Session Activity Tolerance: Patient limited by fatigue Patient left: with call bell/phone within reach;with family/visitor present;in bed Nurse Communication: Mobility status   GP     Rada Hay 10/14/2012, 1:23 PM

## 2012-10-14 NOTE — Progress Notes (Signed)
Physical Therapy Treatment Patient Details Name: Alyssa Rangel MRN: [id number] DOB: [birth year] Today's Date: 10/14/2012 Time: 4098-1191 PT Time Calculation (min): 43 min  PT Assessment / Plan / Recommendation  History of Present Illness s/p right posterior THA   PT Comments   Pt continues to have increased HR and dyspnea with min activity. After practice of steps, HR 137. sats 100% and BP 121/76. RN aware. Recommend that pt's sister assist pt up the 4 steps at home vs. Mother due to pt's fatigue and increased HR.   Follow Up Recommendations  Home health PT;Supervision for mobility/OOB     Does the patient have the potential to tolerate intense rehabilitation     Barriers to Discharge        Equipment Recommendations  Rolling walker with 5" wheels    Recommendations for Other Services    Frequency 7X/week   Progress towards PT Goals Progress towards PT goals: Progressing toward goals  Plan Current plan remains appropriate    Precautions / Restrictions Precautions Precautions: Fall;Posterior Hip Precaution Comments: increased HR Restrictions Weight Bearing Restrictions: No Other Position/Activity Restrictions: WBAT   Pertinent Vitals/Pain See comments.    Mobility  Bed Mobility Sit to Supine: 5: Supervision Details for Bed Mobility Assistance: Uses leg lifter for Rt LE Transfers Sit to Stand: 5: Supervision;With armrests;With upper extremity assist;From chair/3-in-1 Stand to Sit: To bed;With upper extremity assist;4: Min guard;With armrests Details for Transfer Assistance: increased time Ambulation/Gait Ambulation/Gait Assistance: 4: Min guard Ambulation Distance (Feet): 40 Feet Assistive device: Rolling walker Ambulation/Gait Assistance Details: encouraged pursed lip breathing, pt tends to gasp. Pt noted with increased RR. encouraged to slow down. Gait Pattern: Step-to pattern;Decreased stance time - right;Step-through pattern;Antalgic Gait velocity:  decreased Stairs: Yes Stairs Assistance: 1: +2 Total assist Stairs Assistance Details (indicate cue type and reason): encouraged pt to purse lip breathe. Stair Management Technique: One rail Right;Step to pattern;With crutches Number of Stairs: 2    Exercises     PT Diagnosis:    PT Problem List:   PT Treatment Interventions:     PT Goals (current goals can now be found in the care plan section)    Visit Information  Last PT Received On: 10/14/12 Assistance Needed: +1 History of Present Illness: s/p right posterior THA    Subjective Data      Cognition  Cognition Arousal/Alertness: Awake/alert Behavior During Therapy: WFL for tasks assessed/performed Overall Cognitive Status: Within Functional Limits for tasks assessed    Balance     End of Session PT - End of Session Activity Tolerance: Patient limited by fatigue Patient left: with call bell/phone within reach;with family/visitor present;in bed Nurse Communication: Mobility status   GP     Rada Hay 10/14/2012, 11:41 AM

## 2012-10-27 ENCOUNTER — Inpatient Hospital Stay (HOSPITAL_COMMUNITY)
Admission: EM | Admit: 2012-10-27 | Discharge: 2012-11-01 | DRG: 419 | Disposition: A | Discharge status: Home Health | Payer: 59 | Attending: General Surgery | Admitting: General Surgery

## 2012-10-27 ENCOUNTER — Encounter (HOSPITAL_COMMUNITY): Payer: Self-pay | Admitting: Emergency Medicine

## 2012-10-27 DIAGNOSIS — I10 Essential (primary) hypertension: Secondary | ICD-10-CM | POA: Diagnosis present

## 2012-10-27 DIAGNOSIS — K8000 Calculus of gallbladder with acute cholecystitis without obstruction: Principal | ICD-10-CM | POA: Diagnosis present

## 2012-10-27 DIAGNOSIS — M069 Rheumatoid arthritis, unspecified: Secondary | ICD-10-CM | POA: Diagnosis present

## 2012-10-27 DIAGNOSIS — K5900 Constipation, unspecified: Secondary | ICD-10-CM | POA: Diagnosis present

## 2012-10-27 DIAGNOSIS — D72829 Elevated white blood cell count, unspecified: Secondary | ICD-10-CM | POA: Diagnosis present

## 2012-10-27 DIAGNOSIS — Z96649 Presence of unspecified artificial hip joint: Secondary | ICD-10-CM

## 2012-10-27 DIAGNOSIS — Z87891 Personal history of nicotine dependence: Secondary | ICD-10-CM

## 2012-10-27 DIAGNOSIS — E7800 Pure hypercholesterolemia, unspecified: Secondary | ICD-10-CM | POA: Diagnosis present

## 2012-10-27 DIAGNOSIS — K810 Acute cholecystitis: Secondary | ICD-10-CM | POA: Diagnosis present

## 2012-10-27 DIAGNOSIS — D649 Anemia, unspecified: Secondary | ICD-10-CM | POA: Diagnosis present

## 2012-10-27 DIAGNOSIS — H9190 Unspecified hearing loss, unspecified ear: Secondary | ICD-10-CM | POA: Diagnosis present

## 2012-10-27 HISTORY — DX: Acute cholecystitis: K81.0

## 2012-10-27 LAB — CBC
Hemoglobin: 10.3 g/dL — ABNORMAL LOW (ref 12.0–15.0)
MCH: 26.3 pg (ref 26.0–34.0)
Platelets: 685 10*3/uL — ABNORMAL HIGH (ref 150–400)
RBC: 3.91 MIL/uL (ref 3.87–5.11)
WBC: 12.6 10*3/uL — ABNORMAL HIGH (ref 4.0–10.5)

## 2012-10-27 MED ORDER — IOHEXOL 300 MG/ML  SOLN
20.0000 mL | INTRAMUSCULAR | Status: DC
Start: 2012-10-28 — End: 2012-10-28
  Administered 2012-10-27: 25 mL via ORAL

## 2012-10-27 MED ORDER — SODIUM CHLORIDE 0.9 % IV BOLUS (SEPSIS)
1000.0000 mL | Freq: Once | INTRAVENOUS | Status: AC
  Administered 2012-10-27: 1000 mL via INTRAVENOUS

## 2012-10-27 MED ORDER — PANTOPRAZOLE SODIUM 40 MG IV SOLR
40.0000 mg | Freq: Once | INTRAVENOUS | Status: AC
  Administered 2012-10-28: 40 mg via INTRAVENOUS
  Filled 2012-10-27: qty 40

## 2012-10-27 MED ORDER — ONDANSETRON HCL 4 MG/2ML IJ SOLN
4.0000 mg | Freq: Once | INTRAMUSCULAR | Status: AC
  Administered 2012-10-27: 4 mg via INTRAVENOUS
  Filled 2012-10-27: qty 2

## 2012-10-27 MED ORDER — HYDROMORPHONE HCL PF 1 MG/ML IJ SOLN
1.0000 mg | Freq: Once | INTRAMUSCULAR | Status: AC
  Administered 2012-10-28: 1 mg via INTRAVENOUS
  Filled 2012-10-27: qty 1

## 2012-10-27 NOTE — ED Notes (Signed)
Pt states nausea and vomiting since 1800. States she had this once a year ago and had a cyst. Pt states she is having abdominal pain and is out of her oxycodone and tramadol.

## 2012-10-27 NOTE — ED Provider Notes (Signed)
CSN: [id number]     Arrival date & time 10/27/12  2215 History   First MD Initiated Contact with Patient 10/27/12 2257     Chief Complaint  Patient presents with  . Emesis  . Abdominal Pain   (Consider location/radiation/quality/duration/timing/severity/associated sxs/prior Treatment) HPI Hx per PT. Epigastric pain onset around 6pm, sharp pain radiates to her back, mod to severe with N/V. No blood in emesis or stools, one episode of loose stool.  No sick contacts or recent travel.  No F/C. No h/o same. No know aggravating or alleviating factors. Recent hip surgery 2 weeks ago is on Xarelto post operatively. Has history of dermoid cyst on right ovary resected earlier this year.   Past Medical History  Diagnosis Date  . Hip pain     pt states no cartilage in right hip very painful  . Hearing loss   . Routine gynecological examination     Dr. Osborn Coho, [facility]  . Hypertension     due to diet pills in the past, never on meds; normotensive as of 7/14  . Hypercholesterolemia     prior medication therapy  . Rheumatoid arthritis(714.0)     patient reported history  . 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 Polyarthralgia   . Anemia   . History of body piercing     tongue ring   Past Surgical History  Procedure Laterality Date  . Laparoscopy  02/06/2012    Procedure: LAPAROSCOPY OPERATIVE;  Surgeon: Purcell Nails, MD;  Location: [facility];  Service: Gynecology;  Laterality: N/A;  . Dilitation & currettage/hystroscopy with novasure ablation  02/06/2012    Procedure: DILATATION & CURETTAGE/HYSTEROSCOPY WITH NOVASURE ABLATION;  Surgeon: Purcell Nails, MD;  Location: [facility];  Service: Gynecology;  Laterality: N/A;   novasure  . Cystoscopy  02/06/2012    Procedure: CYSTOSCOPY;  Surgeon: Purcell Nails, MD;  Location: [facility];  Service: Gynecology;  Laterality: N/A;  . Ovarian cyst removal  02/06/2012    Procedure: OVARIAN CYSTECTOMY;   Surgeon: Purcell Nails, MD;  Location: [facility];  Service: Gynecology;  Laterality: Bilateral;  . Bilateral salpingectomy  02/06/2012    Procedure: BILATERAL SALPINGECTOMY;  Surgeon: Purcell Nails, MD;  Location: [facility];  Service: Gynecology;  Laterality: Bilateral;  partial bilateral salpingectomy  . Total hip arthroplasty Right 10/09/2012    Procedure: RIGHT TOTAL HIP ARTHROPLASTY;  Surgeon: Loanne Drilling, MD;  Location: [facility];  Service: Orthopedics;  Laterality: Right;   Family History  Problem Relation Age of Onset  . Heart disease Maternal Grandmother   . Diabetes Maternal Grandmother   . Hypertension Maternal Grandmother   . Arthritis Maternal Grandmother   . Diabetes Mother     insulin dependent  . Hypertension Mother   . Other Father     unknown  . Multiple sclerosis Sister   . Cancer Neg Hx   . Stroke Neg Hx    History  Substance Use Topics  . Smoking status: Former Smoker -- 1.00 packs/day for 25 years    Types: Cigarettes    Quit date: 10/02/2011  . Smokeless tobacco: Never Used  . Alcohol Use: 0.6 oz/week    1 Glasses of wine per week     Comment: occ.   OB History   Grav Para Term Preterm Abortions TAB SAB Ect Mult Living   0  Review of Systems  Constitutional: Negative for fever and chills.  Eyes: Negative for pain.  Respiratory: Negative for shortness of breath.   Cardiovascular: Negative for chest pain.  Gastrointestinal: Positive for vomiting and abdominal pain.  Genitourinary: Negative for dysuria.  Musculoskeletal: Negative for back pain, neck pain and neck stiffness.  Skin: Negative for rash.  Neurological: Negative for headaches.  All other systems reviewed and are negative.    Allergies  Review of patient's allergies indicates no known allergies.  Home Medications   Current Outpatient Rx  Name  Route  Sig  Dispense  Refill  . methocarbamol (ROBAXIN) 500 MG tablet   Oral   Take 1 tablet (500 mg total) by mouth every 6  (six) hours as needed.   80 tablet   0   . oxyCODONE (OXY IR/ROXICODONE) 5 MG immediate release tablet   Oral   Take 1-2 tablets (5-10 mg total) by mouth every 3 (three) hours as needed.   80 tablet   0   . rivaroxaban (XARELTO) 10 MG TABS tablet   Oral   Take 1 tablet (10 mg total) by mouth daily with breakfast. Take Xarelto for two and a half more weeks, then discontinue Xarelto. Once the patient has completed the blood thinner regimen, then take a Baby 81 mg Aspirin daily for four more weeks.   19 tablet   0   . rosuvastatin (CRESTOR) 20 MG tablet   Oral   Take 20 mg by mouth every morning.         . traMADol (ULTRAM) 50 MG tablet   Oral   Take 1-2 tablets (50-100 mg total) by mouth every 6 (six) hours as needed (mild pain).   60 tablet   0    BP 150/80  Pulse 100  Ht 5' 4.5" (1.638 m)  Wt 229 lb (103.874 kg)  BMI 38.71 kg/m2  LMP 01/27/2012 Physical Exam  Constitutional: She is oriented to person, place, and time. She appears well-developed and well-nourished.  HENT:  Head: Normocephalic and atraumatic.  Eyes: EOM are normal. Pupils are equal, round, and reactive to light.  Neck: Neck supple.  Cardiovascular: Normal rate, regular rhythm and intact distal pulses.   Pulmonary/Chest: Effort normal and breath sounds normal. No respiratory distress. She exhibits no tenderness.  Abdominal: Soft.  TTP epigastric, neg CVAT, neg Murphys  Musculoskeletal: Normal range of motion. She exhibits no edema.  Neurological: She is alert and oriented to person, place, and time.  Skin: Skin is warm and dry.    ED Course  Procedures (including critical care time) Labs Review Labs Reviewed  CBC - Abnormal; Notable for the following:    WBC 12.6 (*)    Hemoglobin 10.3 (*)    HCT 31.1 (*)    Platelets 685 (*)    All other components within normal limits  COMPREHENSIVE METABOLIC PANEL - Abnormal; Notable for the following:    Glucose, Bld 149 (*)    All other components  within normal limits  URINALYSIS, ROUTINE W REFLEX MICROSCOPIC - Abnormal; Notable for the following:    APPearance CLOUDY (*)    pH 8.5 (*)    Ketones, ur 15 (*)    Protein, ur 100 (*)    All other components within normal limits  URINE MICROSCOPIC-ADD ON - Abnormal; Notable for the following:    Squamous Epithelial / LPF FEW (*)    All other components within normal limits  LIPASE, BLOOD   Imaging Review US Abdomen  Complete  10/28/2012   CLINICAL DATA:  Abdominal pain and emesis for 4 days.  EXAM: ULTRASOUND ABDOMEN COMPLETE  COMPARISON:  CT abdomen and pelvis 10/2012.  FINDINGS: Gallbladder  Cholelithiasis and sludge in the gallbladder. Gallbladder wall thickness is upper limits of normal. Murphy's sign is positive.  Common bile duct  Diameter: 7.4 mm, upper limits of normal.  Liver  No focal lesion identified. Within normal limits in parenchymal echogenicity.  IVC  No abnormality visualized.  Pancreas  Visualized portion unremarkable.  Spleen  Size and appearance within normal limits.  Right Kidney  Length: 13.2 cm. Echogenicity within normal limits. No mass or hydronephrosis visualized.  Left Kidney  Length: 10.9 cm. Echogenicity within normal limits. No mass or hydronephrosis visualized.  Abdominal aorta  No aneurysm visualized.  IMPRESSION: Cholelithiasis with mild gallbladder wall thickening and positive Murphy's sign. Changes are consistent with acute cholecystitis in the appropriate clinical setting.   Electronically Signed   By: Burman Nieves M.D.   On: 10/28/2012 04:50   US Transvaginal Non-ob  10/28/2012   CLINICAL DATA:  Abdominal pain and pelvic mass. Right-sided pelvic pain. History of right-sided oophorectomy and bilateral salpingectomy.  EXAM: TRANSABDOMINAL AND TRANSVAGINAL ULTRASOUND OF PELVIS  TECHNIQUE: Both transabdominal and transvaginal ultrasound examinations of the pelvis were performed. Transabdominal technique was performed for global imaging of the pelvis including  uterus, ovaries, adnexal regions, and pelvic cul-de-sac. It was necessary to proceed with endovaginal exam following the transabdominal exam to visualize the endometrium and left adnexa.  COMPARISON:  CT abdomen and pelvis 10/28/2012  FINDINGS: Uterus  Measurements: 12.4 x 6.5 x 6.3 cm. Neutral configuration. Heterogeneous mass in the uterine fundus measuring 4.2 x 3.2 x 3 cm consistent with a fibroid.  Endometrium  Thickness: 2.2 mm. Small amount of fluid in the endocervical canal is nonspecific.  Right ovary  Surgically absent. No adnexal masses.  Left ovary  Measurements: 6.6 x 3.5 x 5.1 cm. Complex heterogeneous cystic and solid mass measuring 2.7 x 2.7 x 1.5 cm. No flow is demonstrated in the lesion. This corresponds to the abnormality seen and CT. This probably represents complex heterogeneous hemorrhagic cyst, but due to the heterogeneous appearance and possible solid component without vascularity, further evaluation is recommended. Consider MRI versus followup ultrasound in 6-12 weeks.  Other findings  Small amount of free fluid in the cul-de-sac.  IMPRESSION: Uterine fibroid. Surgical absence of the right ovary. Complex cystic and solid mass in the left ovary. Followup recommended with either MRI or followup ultrasound in 6-12 weeks.   Electronically Signed   By: Burman Nieves M.D.   On: 10/28/2012 05:07   US Pelvis Complete  10/28/2012   CLINICAL DATA:  Abdominal pain and pelvic mass. Right-sided pelvic pain. History of right-sided oophorectomy and bilateral salpingectomy.  EXAM: TRANSABDOMINAL AND TRANSVAGINAL ULTRASOUND OF PELVIS  TECHNIQUE: Both transabdominal and transvaginal ultrasound examinations of the pelvis were performed. Transabdominal technique was performed for global imaging of the pelvis including uterus, ovaries, adnexal regions, and pelvic cul-de-sac. It was necessary to proceed with endovaginal exam following the transabdominal exam to visualize the endometrium and left adnexa.   COMPARISON:  CT abdomen and pelvis 10/28/2012  FINDINGS: Uterus  Measurements: 12.4 x 6.5 x 6.3 cm. Neutral configuration. Heterogeneous mass in the uterine fundus measuring 4.2 x 3.2 x 3 cm consistent with a fibroid.  Endometrium  Thickness: 2.2 mm. Small amount of fluid in the endocervical canal is nonspecific.  Right ovary  Surgically absent. No adnexal masses.  Left ovary  Measurements: 6.6 x 3.5 x 5.1 cm. Complex heterogeneous cystic and solid mass measuring 2.7 x 2.7 x 1.5 cm. No flow is demonstrated in the lesion. This corresponds to the abnormality seen and CT. This probably represents complex heterogeneous hemorrhagic cyst, but due to the heterogeneous appearance and possible solid component without vascularity, further evaluation is recommended. Consider MRI versus followup ultrasound in 6-12 weeks.  Other findings  Small amount of free fluid in the cul-de-sac.  IMPRESSION: Uterine fibroid. Surgical absence of the right ovary. Complex cystic and solid mass in the left ovary. Followup recommended with either MRI or followup ultrasound in 6-12 weeks.   Electronically Signed   By: Burman Nieves M.D.   On: 10/28/2012 05:07   Ct Abdomen Pelvis W Contrast  10/28/2012   *RADIOLOGY REPORT*  Clinical Data: Abdominal pain with nausea and vomiting.  Bilateral cell patent though the four rectum knee.  CT ABDOMEN AND PELVIS WITH CONTRAST  Technique:  Multidetector CT imaging of the abdomen and pelvis was performed following the standard protocol during bolus administration of intravenous contrast.  Contrast: OMNIPAQUE IOHEXOL 300 MG/ML  SOLN  Comparison: CT of the abdomen and pelvis September 17, 2011.  Findings: Limited view of the lung bases demonstrates mild vascular congestion, borderline cardiomegaly.  The subpleural nodules seen previously is less conspicuous on this motion degraded examination.  The liver, spleen, pancreas and adrenal glands are unremarkable. Multiple gallstones again seen, without  superimposed inflammatory changes.  Trace hiatal hernia.  The stomach, small and large bowel are normal in course and caliber without definite wall thickening or inflammatory changes though, sensitivity may be decreased as contrast has yet to reach the large bowel.  Kidneys orthotopic, demonstrating symmetric enhancement, no nephrolithiasis, hydronephrosis or renal masses.  Prompt symmetric excretion into the proximal urinary collecting system.  Somewhat prominent heterogeneous uterus is unchanged, and on prior study better demonstrated a leiomyoma. Apparent partial resection of the left adnexal complex mass seen previously with a residual 2 cm fatty mass in the left pelvis, with surrounding apparent residual adnexal tissue. No residual fatty mass in right pelvis. Somewhat limited evaluation of the pelvis due to beam hardening artifact from right hip arthroplasty.  Urinary bladder is unremarkable.  No free fluid or suspicious fluid collections within the pelvis.  Mild fatty atrophy of the right piriformis muscle now noted. Smaller right psoas muscle is unchanged.  Severe lower lumbar facet arthropathy, resulting in severe L4-5 and L5 S1 neural foramen narrowing.  IMPRESSION: 2 cm fatty mass in the left pelvis, with surrounding apparent residual adnexal tissue, evaluation of pelvic structures limited by beam hardening artifact from right hip arthroplasty.  Recommend a follow-up pelvic ultrasound for further characterization.  No CT findings of residual right adnexal mass.  Cholelithiasis without CT findings of acute cholecystitis.   Original Report Authenticated By: Awilda Metro     Date: 10/28/2012  Rate: 94  Rhythm: normal sinus rhythm  QRS Axis: normal  Intervals: normal  ST/T Wave abnormalities: nonspecific ST changes  Conduction Disutrbances:none  Narrative Interpretation:   Old EKG Reviewed: none available  IVFs, IV zofran/ dilaudid/ protonix Requiring repeat doses of pain and nausea  medications Ultrasound reviewed as above and general surgery consulted.  Dr. Magnus Ivan will evaluate   MDM  Diagnosis: Abdominal pain/ Acute cholecystitis EKG, labs, imaging IV fluids and IV narcotics [facility] admit    Sunnie Nielsen, MD 10/28/12 [phone]

## 2012-10-27 NOTE — ED Notes (Signed)
Pt states nausea and vomiting since 1800.

## 2012-10-28 ENCOUNTER — Emergency Department (HOSPITAL_COMMUNITY): Payer: 59

## 2012-10-28 ENCOUNTER — Encounter (HOSPITAL_COMMUNITY): Payer: Self-pay | Admitting: Radiology

## 2012-10-28 DIAGNOSIS — R1013 Epigastric pain: Secondary | ICD-10-CM

## 2012-10-28 DIAGNOSIS — R112 Nausea with vomiting, unspecified: Secondary | ICD-10-CM

## 2012-10-28 LAB — URINALYSIS, ROUTINE W REFLEX MICROSCOPIC
Bilirubin Urine: NEGATIVE
Glucose, UA: NEGATIVE mg/dL
Hgb urine dipstick: NEGATIVE
Ketones, ur: 15 mg/dL — AB
Leukocytes, UA: NEGATIVE
Nitrite: NEGATIVE
Protein, ur: 100 mg/dL — AB
Specific Gravity, Urine: 1.026 (ref 1.005–1.030)
Urobilinogen, UA: 1 mg/dL (ref 0.0–1.0)
pH: 8.5 — ABNORMAL HIGH (ref 5.0–8.0)

## 2012-10-28 LAB — COMPREHENSIVE METABOLIC PANEL
ALT: 14 U/L (ref 0–35)
AST: 12 U/L (ref 0–37)
Albumin: 3.6 g/dL (ref 3.5–5.2)
Alkaline Phosphatase: 82 U/L (ref 39–117)
CO2: 21 mEq/L (ref 19–32)
Calcium: 9.6 mg/dL (ref 8.4–10.5)
GFR calc Af Amer: >90 mL/min (ref >90)
GFR calc non Af Amer: >90 mL/min (ref >90)
Glucose, Bld: 149 mg/dL — ABNORMAL HIGH (ref 70–99)
Potassium: 3.6 mEq/L (ref 3.5–5.1)
Sodium: 137 mEq/L (ref 135–145)

## 2012-10-28 LAB — URINE MICROSCOPIC-ADD ON

## 2012-10-28 MED ORDER — ONDANSETRON HCL 4 MG/2ML IJ SOLN
4.0000 mg | Freq: Four times a day (QID) | INTRAMUSCULAR | Status: DC | PRN
  Administered 2012-10-28 – 2012-10-29 (×3): 4 mg via INTRAVENOUS
  Filled 2012-10-28 (×3): qty 2

## 2012-10-28 MED ORDER — HYDROMORPHONE HCL PF 1 MG/ML IJ SOLN
1.0000 mg | Freq: Once | INTRAMUSCULAR | Status: AC
  Administered 2012-10-28: 1 mg via INTRAVENOUS
  Filled 2012-10-28: qty 1

## 2012-10-28 MED ORDER — BIOTENE DRY MOUTH MT LIQD
15.0000 mL | Freq: Two times a day (BID) | OROMUCOSAL | Status: DC
  Administered 2012-10-29 – 2012-10-31 (×4): 15 mL via OROMUCOSAL

## 2012-10-28 MED ORDER — IOHEXOL 300 MG/ML  SOLN
100.0000 mL | Freq: Once | INTRAMUSCULAR | Status: AC | PRN
  Administered 2012-10-28: 100 mL via INTRAVENOUS

## 2012-10-28 MED ORDER — PANTOPRAZOLE SODIUM 40 MG IV SOLR
40.0000 mg | Freq: Every day | INTRAVENOUS | Status: DC
  Administered 2012-10-28 – 2012-10-31 (×4): 40 mg via INTRAVENOUS
  Filled 2012-10-28 (×8): qty 40

## 2012-10-28 MED ORDER — PIPERACILLIN-TAZOBACTAM 3.375 G IVPB
3.3750 g | Freq: Three times a day (TID) | INTRAVENOUS | Status: DC
  Administered 2012-10-28 – 2012-10-31 (×10): 3.375 g via INTRAVENOUS
  Filled 2012-10-28 (×13): qty 50

## 2012-10-28 MED ORDER — ONDANSETRON HCL 4 MG/2ML IJ SOLN: 4.0000 mg | Freq: Once | INTRAMUSCULAR | Status: DC

## 2012-10-28 MED ORDER — CHLORHEXIDINE GLUCONATE 0.12 % MT SOLN
15.0000 mL | Freq: Two times a day (BID) | OROMUCOSAL | Status: DC
  Administered 2012-10-28 – 2012-11-01 (×8): 15 mL via OROMUCOSAL
  Filled 2012-10-28 (×7): qty 15

## 2012-10-28 MED ORDER — PROMETHAZINE HCL 25 MG/ML IJ SOLN
25.0000 mg | Freq: Once | INTRAMUSCULAR | Status: AC
  Administered 2012-10-28: 25 mg via INTRAVENOUS
  Filled 2012-10-28: qty 1

## 2012-10-28 MED ORDER — INFLUENZA VAC SPLIT QUAD 0.5 ML IM SUSP
0.5000 mL | INTRAMUSCULAR | Status: AC
  Administered 2012-10-29: 0.5 mL via INTRAMUSCULAR
  Filled 2012-10-28: qty 0.5

## 2012-10-28 MED ORDER — POTASSIUM CHLORIDE IN NACL 20-0.9 MEQ/L-% IV SOLN
INTRAVENOUS | Status: DC
  Administered 2012-10-28 – 2012-10-31 (×6): via INTRAVENOUS
  Filled 2012-10-28 (×16): qty 1000

## 2012-10-28 MED ORDER — HYDROMORPHONE HCL PF 1 MG/ML IJ SOLN
1.0000 mg | INTRAMUSCULAR | Status: DC | PRN
  Administered 2012-10-28 – 2012-10-31 (×20): 1 mg via INTRAVENOUS
  Filled 2012-10-28 (×21): qty 1

## 2012-10-28 NOTE — ED Notes (Signed)
CT called pt finished PO contrast.

## 2012-10-28 NOTE — ED Notes (Signed)
Admitting MD at bedside.

## 2012-10-28 NOTE — H&P (Signed)
Alyssa Rangel is an 47 y.o. female.   Chief Complaint: Epigastric abdominal pain, nausea, vomiting HPI: This is a 47 year old female who presents with the sudden onset of epigastric abdominal pain hurting through to her back. She also developed multiple episodes of nausea and vomiting. This began yesterday. The pain was sharp and constant as well as moderate to severe. She has no previous attacks. She is less than 3 weeks out from hip replacement surgery. She is taking blood thinning medication which was to stop tomorrow. She is otherwise without complaints.  Past Medical History  Diagnosis Date  . Hip pain     pt states no cartilage in right hip very painful  . Hearing loss   . Routine gynecological examination     Dr. Osborn Coho, [facility]  . Hypertension     due to diet pills in the past, never on meds; normotensive as of 7/14  . Hypercholesterolemia     prior medication therapy  . Rheumatoid arthritis(714.0)     patient reported history  . 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 Polyarthralgia   . Anemia   . History of body piercing     tongue ring    Past Surgical History  Procedure Laterality Date  . Laparoscopy  02/06/2012    Procedure: LAPAROSCOPY OPERATIVE;  Surgeon: Purcell Nails, MD;  Location: [facility];  Service: Gynecology;  Laterality: N/A;  . Dilitation & currettage/hystroscopy with novasure ablation  02/06/2012    Procedure: DILATATION & CURETTAGE/HYSTEROSCOPY WITH NOVASURE ABLATION;  Surgeon: Purcell Nails, MD;  Location: [facility];  Service: Gynecology;  Laterality: N/A;   novasure  . Cystoscopy  02/06/2012    Procedure: CYSTOSCOPY;  Surgeon: Purcell Nails, MD;  Location: [facility];  Service: Gynecology;  Laterality: N/A;  . Ovarian cyst removal  02/06/2012    Procedure: OVARIAN CYSTECTOMY;  Surgeon: Purcell Nails, MD;  Location: [facility];  Service: Gynecology;  Laterality: Bilateral;  . Bilateral  salpingectomy  02/06/2012    Procedure: BILATERAL SALPINGECTOMY;  Surgeon: Purcell Nails, MD;  Location: [facility];  Service: Gynecology;  Laterality: Bilateral;  partial bilateral salpingectomy  . Total hip arthroplasty Right 10/09/2012    Procedure: RIGHT TOTAL HIP ARTHROPLASTY;  Surgeon: Loanne Drilling, MD;  Location: [facility];  Service: Orthopedics;  Laterality: Right;    Family History  Problem Relation Age of Onset  . Heart disease Maternal Grandmother   . Diabetes Maternal Grandmother   . Hypertension Maternal Grandmother   . Arthritis Maternal Grandmother   . Diabetes Mother     insulin dependent  . Hypertension Mother   . Other Father     unknown  . Multiple sclerosis Sister   . Cancer Neg Hx   . Stroke Neg Hx    Social History:  reports that she quit smoking about 12 months ago. Her smoking use included Cigarettes. She has a 25 pack-year smoking history. She has never used smokeless tobacco. She reports that she drinks about 0.6 ounces of alcohol per week. She reports that she does not use illicit drugs.  Allergies: No Known Allergies   (Not in a hospital admission)  Results for orders placed during the hospital encounter of 10/27/12 (from the past 48 hour(s))  CBC     Status: Abnormal   Collection Time    10/27/12 11:01 PM      Result Value Range   WBC 12.6 (*)  4.0 - 10.5 K/uL   RBC 3.91  3.87 - 5.11 MIL/uL   Hemoglobin 10.3 (*) 12.0 - 15.0 g/dL   HCT 29.5 (*) 28.4 - 13.2 %   MCV 79.5  78.0 - 100.0 fL   MCH 26.3  26.0 - 34.0 pg   MCHC 33.1  30.0 - 36.0 g/dL   RDW 44.0  10.2 - 72.5 %   Platelets 685 (*) 150 - 400 K/uL  COMPREHENSIVE METABOLIC PANEL     Status: Abnormal   Collection Time    10/27/12 11:01 PM      Result Value Range   Sodium 137  135 - 145 mEq/L   Potassium 3.6  3.5 - 5.1 mEq/L   Chloride 100  96 - 112 mEq/L   CO2 21  19 - 32 mEq/L   Glucose, Bld 149 (*) 70 - 99 mg/dL   BUN 12  6 - 23 mg/dL   Creatinine, Ser 3.66  0.50 - 1.10 mg/dL   Calcium  9.6  8.4 - 44.0 mg/dL   Total Protein 8.3  6.0 - 8.3 g/dL   Albumin 3.6  3.5 - 5.2 g/dL   AST 12  0 - 37 U/L   ALT 14  0 - 35 U/L   Alkaline Phosphatase 82  39 - 117 U/L   Total Bilirubin 0.4  0.3 - 1.2 mg/dL   GFR calc non Af Amer >90  >90 mL/min   GFR calc Af Amer >90  >90 mL/min   Comment: (NOTE)     The eGFR has been calculated using the CKD EPI equation.     This calculation has not been validated in all clinical situations.     eGFR's persistently <90 mL/min signify possible Chronic Kidney     Disease.  LIPASE, BLOOD     Status: None   Collection Time    10/27/12 11:01 PM      Result Value Range   Lipase 23  11 - 59 U/L  URINALYSIS, ROUTINE W REFLEX MICROSCOPIC     Status: Abnormal   Collection Time    10/28/12 12:33 AM      Result Value Range   Color, Urine YELLOW  YELLOW   APPearance CLOUDY (*) CLEAR   Specific Gravity, Urine 1.026  1.005 - 1.030   pH 8.5 (*) 5.0 - 8.0   Glucose, UA NEGATIVE  NEGATIVE mg/dL   Hgb urine dipstick NEGATIVE  NEGATIVE   Bilirubin Urine NEGATIVE  NEGATIVE   Ketones, ur 15 (*) NEGATIVE mg/dL   Protein, ur 347 (*) NEGATIVE mg/dL   Urobilinogen, UA 1.0  0.0 - 1.0 mg/dL   Nitrite NEGATIVE  NEGATIVE   Leukocytes, UA NEGATIVE  NEGATIVE  URINE MICROSCOPIC-ADD ON     Status: Abnormal   Collection Time    10/28/12 12:33 AM      Result Value Range   Squamous Epithelial / LPF FEW (*) RARE   WBC, UA 0-2  <3 WBC/hpf   RBC / HPF 0-2  <3 RBC/hpf   Bacteria, UA RARE  RARE   Urine-Other MUCOUS PRESENT     US Abdomen Complete  10/28/2012   CLINICAL DATA:  Abdominal pain and emesis for 4 days.  EXAM: ULTRASOUND ABDOMEN COMPLETE  COMPARISON:  CT abdomen and pelvis 10/2012.  FINDINGS: Gallbladder  Cholelithiasis and sludge in the gallbladder. Gallbladder wall thickness is upper limits of normal. Murphy's sign is positive.  Common bile duct  Diameter: 7.4 mm, upper limits of normal.  Liver  No focal lesion identified. Within normal limits in parenchymal  echogenicity.  IVC  No abnormality visualized.  Pancreas  Visualized portion unremarkable.  Spleen  Size and appearance within normal limits.  Right Kidney  Length: 13.2 cm. Echogenicity within normal limits. No mass or hydronephrosis visualized.  Left Kidney  Length: 10.9 cm. Echogenicity within normal limits. No mass or hydronephrosis visualized.  Abdominal aorta  No aneurysm visualized.  IMPRESSION: Cholelithiasis with mild gallbladder wall thickening and positive Murphy's sign. Changes are consistent with acute cholecystitis in the appropriate clinical setting.   Electronically Signed   By: Burman Nieves M.D.   On: 10/28/2012 04:50   US Transvaginal Non-ob  10/28/2012   CLINICAL DATA:  Abdominal pain and pelvic mass. Right-sided pelvic pain. History of right-sided oophorectomy and bilateral salpingectomy.  EXAM: TRANSABDOMINAL AND TRANSVAGINAL ULTRASOUND OF PELVIS  TECHNIQUE: Both transabdominal and transvaginal ultrasound examinations of the pelvis were performed. Transabdominal technique was performed for global imaging of the pelvis including uterus, ovaries, adnexal regions, and pelvic cul-de-sac. It was necessary to proceed with endovaginal exam following the transabdominal exam to visualize the endometrium and left adnexa.  COMPARISON:  CT abdomen and pelvis 10/28/2012  FINDINGS: Uterus  Measurements: 12.4 x 6.5 x 6.3 cm. Neutral configuration. Heterogeneous mass in the uterine fundus measuring 4.2 x 3.2 x 3 cm consistent with a fibroid.  Endometrium  Thickness: 2.2 mm. Small amount of fluid in the endocervical canal is nonspecific.  Right ovary  Surgically absent. No adnexal masses.  Left ovary  Measurements: 6.6 x 3.5 x 5.1 cm. Complex heterogeneous cystic and solid mass measuring 2.7 x 2.7 x 1.5 cm. No flow is demonstrated in the lesion. This corresponds to the abnormality seen and CT. This probably represents complex heterogeneous hemorrhagic cyst, but due to the heterogeneous appearance and  possible solid component without vascularity, further evaluation is recommended. Consider MRI versus followup ultrasound in 6-12 weeks.  Other findings  Small amount of free fluid in the cul-de-sac.  IMPRESSION: Uterine fibroid. Surgical absence of the right ovary. Complex cystic and solid mass in the left ovary. Followup recommended with either MRI or followup ultrasound in 6-12 weeks.   Electronically Signed   By: Burman Nieves M.D.   On: 10/28/2012 05:07   US Pelvis Complete  10/28/2012   CLINICAL DATA:  Abdominal pain and pelvic mass. Right-sided pelvic pain. History of right-sided oophorectomy and bilateral salpingectomy.  EXAM: TRANSABDOMINAL AND TRANSVAGINAL ULTRASOUND OF PELVIS  TECHNIQUE: Both transabdominal and transvaginal ultrasound examinations of the pelvis were performed. Transabdominal technique was performed for global imaging of the pelvis including uterus, ovaries, adnexal regions, and pelvic cul-de-sac. It was necessary to proceed with endovaginal exam following the transabdominal exam to visualize the endometrium and left adnexa.  COMPARISON:  CT abdomen and pelvis 10/28/2012  FINDINGS: Uterus  Measurements: 12.4 x 6.5 x 6.3 cm. Neutral configuration. Heterogeneous mass in the uterine fundus measuring 4.2 x 3.2 x 3 cm consistent with a fibroid.  Endometrium  Thickness: 2.2 mm. Small amount of fluid in the endocervical canal is nonspecific.  Right ovary  Surgically absent. No adnexal masses.  Left ovary  Measurements: 6.6 x 3.5 x 5.1 cm. Complex heterogeneous cystic and solid mass measuring 2.7 x 2.7 x 1.5 cm. No flow is demonstrated in the lesion. This corresponds to the abnormality seen and CT. This probably represents complex heterogeneous hemorrhagic cyst, but due to the heterogeneous appearance and possible solid component without vascularity, further evaluation is recommended. Consider  MRI versus followup ultrasound in 6-12 weeks.  Other findings  Small amount of free fluid in the  cul-de-sac.  IMPRESSION: Uterine fibroid. Surgical absence of the right ovary. Complex cystic and solid mass in the left ovary. Followup recommended with either MRI or followup ultrasound in 6-12 weeks.   Electronically Signed   By: Burman Nieves M.D.   On: 10/28/2012 05:07   Ct Abdomen Pelvis W Contrast  10/28/2012   *RADIOLOGY REPORT*  Clinical Data: Abdominal pain with nausea and vomiting.  Bilateral cell patent though the four rectum knee.  CT ABDOMEN AND PELVIS WITH CONTRAST  Technique:  Multidetector CT imaging of the abdomen and pelvis was performed following the standard protocol during bolus administration of intravenous contrast.  Contrast: OMNIPAQUE IOHEXOL 300 MG/ML  SOLN  Comparison: CT of the abdomen and pelvis September 17, 2011.  Findings: Limited view of the lung bases demonstrates mild vascular congestion, borderline cardiomegaly.  The subpleural nodules seen previously is less conspicuous on this motion degraded examination.  The liver, spleen, pancreas and adrenal glands are unremarkable. Multiple gallstones again seen, without superimposed inflammatory changes.  Trace hiatal hernia.  The stomach, small and large bowel are normal in course and caliber without definite wall thickening or inflammatory changes though, sensitivity may be decreased as contrast has yet to reach the large bowel.  Kidneys orthotopic, demonstrating symmetric enhancement, no nephrolithiasis, hydronephrosis or renal masses.  Prompt symmetric excretion into the proximal urinary collecting system.  Somewhat prominent heterogeneous uterus is unchanged, and on prior study better demonstrated a leiomyoma. Apparent partial resection of the left adnexal complex mass seen previously with a residual 2 cm fatty mass in the left pelvis, with surrounding apparent residual adnexal tissue. No residual fatty mass in right pelvis. Somewhat limited evaluation of the pelvis due to beam hardening artifact from right hip  arthroplasty.  Urinary bladder is unremarkable.  No free fluid or suspicious fluid collections within the pelvis.  Mild fatty atrophy of the right piriformis muscle now noted. Smaller right psoas muscle is unchanged.  Severe lower lumbar facet arthropathy, resulting in severe L4-5 and L5 S1 neural foramen narrowing.  IMPRESSION: 2 cm fatty mass in the left pelvis, with surrounding apparent residual adnexal tissue, evaluation of pelvic structures limited by beam hardening artifact from right hip arthroplasty.  Recommend a follow-up pelvic ultrasound for further characterization.  No CT findings of residual right adnexal mass.  Cholelithiasis without CT findings of acute cholecystitis.   Original Report Authenticated By: Awilda Metro    Review of Systems  Constitutional: Negative for fever and chills.  HENT: Negative.   Eyes: Negative.   Respiratory: Negative.  Negative for shortness of breath.   Cardiovascular: Negative.  Negative for chest pain.  Gastrointestinal: Positive for nausea, vomiting and abdominal pain.  Genitourinary: Negative.   Musculoskeletal: Positive for joint pain.  Skin: Negative.   Neurological: Negative.   Endo/Heme/Allergies: Negative.   Psychiatric/Behavioral: Negative.     Blood pressure 162/78, pulse 110, temperature 99.1 F (37.3 C), temperature source Rectal, height 5' 4.5" (1.638 m), weight 229 lb (103.874 kg), last menstrual period 01/27/2012, SpO2 100.00%. Physical Exam  Constitutional: She is oriented to person, place, and time.  Obese female in no acute distress  HENT:  Head: Normocephalic and atraumatic.  Right Ear: External ear normal.  Left Ear: External ear normal.  Nose: Nose normal.  Mouth/Throat: Oropharynx is clear and moist. No oropharyngeal exudate.  Eyes: Conjunctivae are normal. Pupils are equal, round, and reactive  to light. Right eye exhibits no discharge. Left eye exhibits no discharge. No scleral icterus.  Neck: Normal range of motion.  Neck supple. No tracheal deviation present.  Cardiovascular: Normal rate, regular rhythm, normal heart sounds and intact distal pulses.   No murmur heard. Respiratory: Effort normal and breath sounds normal. No respiratory distress. She has no wheezes. She has no rales.  GI: Soft. There is tenderness. There is guarding.  Abdomen soft and obese. There is tenderness with guarding in the right upper quadrant and epigastrium  Musculoskeletal: Normal range of motion. She exhibits no edema and no tenderness.  Lymphadenopathy:    She has no cervical adenopathy.  Neurological: She is alert and oriented to person, place, and time.  Skin: Skin is warm and dry. No rash noted. No erythema.  Psychiatric: Her behavior is normal. Judgment normal.     Assessment/Plan Acute cholecystitis with cholelithiasis  She will be admitted and started on IV antibiotics. Laparoscopic cholecystectomy is recommended during this admission. Timing of surgery will be based on her coming off her anticoagulation medication. She understands and agrees with the admission  Gavyn Ybarra A 10/28/2012, 6:25 AM

## 2012-10-28 NOTE — ED Notes (Signed)
Attempted report 

## 2012-10-28 NOTE — ED Notes (Signed)
Pt remains in ultrasound.

## 2012-10-29 DIAGNOSIS — K8000 Calculus of gallbladder with acute cholecystitis without obstruction: Secondary | ICD-10-CM

## 2012-10-29 LAB — COMPREHENSIVE METABOLIC PANEL
AST: 9 U/L (ref 0–37)
Albumin: 3 g/dL — ABNORMAL LOW (ref 3.5–5.2)
Alkaline Phosphatase: 71 U/L (ref 39–117)
BUN: 12 mg/dL (ref 6–23)
CO2: 25 mEq/L (ref 19–32)
Calcium: 8.8 mg/dL (ref 8.4–10.5)
Chloride: 103 mEq/L (ref 96–112)
Creatinine, Ser: 0.77 mg/dL (ref 0.50–1.10)
GFR calc non Af Amer: >90 mL/min (ref >90)
Potassium: 4.1 mEq/L (ref 3.5–5.1)
Total Bilirubin: 0.6 mg/dL (ref 0.3–1.2)

## 2012-10-29 LAB — CBC
Hemoglobin: 8.9 g/dL — ABNORMAL LOW (ref 12.0–15.0)
MCH: 26.1 pg (ref 26.0–34.0)
MCHC: 31.6 g/dL (ref 30.0–36.0)
MCV: 82.7 fL (ref 78.0–100.0)
Platelets: 548 10*3/uL — ABNORMAL HIGH (ref 150–400)
RBC: 3.41 MIL/uL — ABNORMAL LOW (ref 3.87–5.11)
RDW: 14.8 % (ref 11.5–15.5)

## 2012-10-29 MED ORDER — DOCUSATE SODIUM 100 MG PO CAPS
100.0000 mg | ORAL_CAPSULE | Freq: Every day | ORAL | Status: DC
  Administered 2012-10-29 – 2012-11-01 (×3): 100 mg via ORAL
  Filled 2012-10-29 (×3): qty 1

## 2012-10-29 MED ORDER — TRAMADOL HCL 50 MG PO TABS
50.0000 mg | ORAL_TABLET | Freq: Four times a day (QID) | ORAL | Status: DC | PRN
  Administered 2012-10-29 – 2012-10-31 (×2): 100 mg via ORAL
  Filled 2012-10-29 (×2): qty 2

## 2012-10-29 MED ORDER — POLYETHYLENE GLYCOL 3350 17 G PO PACK
17.0000 g | PACK | Freq: Every day | ORAL | Status: DC | PRN
  Administered 2012-10-29: 17 g via ORAL
  Filled 2012-10-29: qty 1

## 2012-10-29 MED ORDER — METHOCARBAMOL 500 MG PO TABS
500.0000 mg | ORAL_TABLET | Freq: Four times a day (QID) | ORAL | Status: DC | PRN
  Administered 2012-10-29 – 2012-11-01 (×6): 500 mg via ORAL
  Filled 2012-10-29 (×5): qty 1

## 2012-10-29 NOTE — Progress Notes (Signed)
Patient interviewed and examined, agree with resident note above. Awaiting anticoagulant reversal. Plan to cover with perioperative heparin  Mariella Saa MD, FACS  10/29/2012 9:49 AM

## 2012-10-29 NOTE — Evaluation (Signed)
Physical Therapy Evaluation Patient Details Name: CHIOMA MUKHERJEE MRN: [id number] DOB: October 18, 1965 Today's Date: 10/29/2012 Time: 7829-5621 PT Time Calculation (min): 33 min  PT Assessment / Plan / Recommendation History of Present Illness  This is a 47 year old female who presents with the sudden onset of epigastric abdominal pain hurting through to her back. She also developed multiple episodes of nausea and vomiting. This began yesterday. The pain was sharp and constant as well as moderate to severe. She has no previous attacks. She is less than 3 weeks out from hip replacement surgery. She is taking blood thinning medication which was to stop tomorrow. She is otherwise without complaints.  Clinical Impression  Patient demonstrates deficits in functional mobility as indicated below. Patient will benefit from continued skilled PT to address deficits and maximize function. Encouraged patient to ambulate frequently, safe for ambulation independently with RW, advised need for supervision when using cane. Will continue to see as indicated and progress as tolerated. Worked with patient on transition to single point cane today, patient tolerated well.    PT Assessment  Patient needs continued PT services    Follow Up Recommendations  Home health PT;Supervision for mobility/OOB          Equipment Recommendations  None recommended by PT       Frequency Min 4X/week    Precautions / Restrictions Precautions Precautions: Fall;Posterior Hip Precaution Comments: increased HR Restrictions Weight Bearing Restrictions: No Other Position/Activity Restrictions: WBAT   Pertinent Vitals/Pain Patient reports 3/10 pain prior to activity      Mobility  Bed Mobility Bed Mobility: Not assessed Transfers Transfers: Sit to Stand;Stand to Sit Sit to Stand: 6: Modified independent (Device/Increase time) Stand to Sit: 6: Modified independent (Device/Increase time) Details for Transfer  Assistance: increased time, performed with and without device Ambulation/Gait Ambulation/Gait Assistance: 6: Modified independent (Device/Increase time);5: Supervision Ambulation Distance (Feet): 800 Feet (800 total, 200 with cane) Assistive device: Rolling walker;Straight cane Ambulation/Gait Assistance Details: independent with RW, supervision with SPC Gait Pattern: Step-through pattern;Decreased stance time - right;Antalgic Gait velocity: decreased General Gait Details: overall good stability despite antaglic gait pattern Stairs: No        PT Diagnosis: Difficulty walking  PT Problem List: Decreased strength;Decreased range of motion;Decreased activity tolerance;Decreased balance;Decreased mobility;Decreased knowledge of use of DME;Decreased safety awareness;Decreased knowledge of precautions;Obesity PT Treatment Interventions: DME instruction;Gait training;Functional mobility training;Therapeutic activities;Stair training;Therapeutic exercise;Patient/family education     PT Goals(Current goals can be found in the care plan section) Acute Rehab PT Goals Patient Stated Goal: to be independent PT Goal Formulation: With patient Time For Goal Achievement: 11/12/12 Potential to Achieve Goals: Good  Visit Information  Last PT Received On: 10/29/12 Assistance Needed: +1 History of Present Illness: This is a 47 year old female who presents with the sudden onset of epigastric abdominal pain hurting through to her back. She also developed multiple episodes of nausea and vomiting. This began yesterday. The pain was sharp and constant as well as moderate to severe. She has no previous attacks. She is less than 3 weeks out from hip replacement surgery. She is taking blood thinning medication which was to stop tomorrow. She is otherwise without complaints.       Prior Functioning  Home Living Family/patient expects to be discharged to:: Private residence Living Arrangements: Parent Type of  Home: House Home Access: Stairs to enter Secretary/administrator of Steps: 4 Entrance Stairs-Rails: Right Home Layout: One level Home Equipment: None Additional Comments: amb with cane prior to adm due to  hip pain Prior Function Level of Independence: Independent Communication Communication: No difficulties    Cognition  Cognition Arousal/Alertness: Awake/alert Behavior During Therapy: WFL for tasks assessed/performed Overall Cognitive Status: Within Functional Limits for tasks assessed    Extremity/Trunk Assessment Lower Extremity Assessment Lower Extremity Assessment: RLE deficits/detail RLE Deficits / Details: hx of THA s/p 3 wks   Balance Balance Balance Assessed: Yes Dynamic Standing Balance Dynamic Standing - Level of Assistance: 6: Modified independent (Device/Increase time)  End of Session PT - End of Session Equipment Utilized During Treatment: Gait belt Activity Tolerance: Patient tolerated treatment well Patient left: in chair;with call bell/phone within reach;with family/visitor present Nurse Communication: Mobility status  GP     Fabio Asa 10/29/2012, 3:07 PM Charlotte Crumb, PT DPT  [phone]

## 2012-10-29 NOTE — Progress Notes (Signed)
Received call from [facility] [phone], that this patient was active with them for HHPT prior to admission.   Carlyle Lipa, RN BSN MHA CCM  Case Manager, Trauma Service/Unit [facility] [phone]

## 2012-10-29 NOTE — Progress Notes (Signed)
Patient ID: Alyssa Rangel, female   DOB: October 23, 1965, 47 y.o.   MRN: [id number]    Subjective: Pt reports she is doing well today. She asks for a clear liquid diet, and her home tramadol and robaxin. She reports she had 1 week left for home PT from her hip surgery 3 weeks ago and would like it to be continued while in the hospital if possible. She has been able to ambulate her room.   Objective: Vital signs in last 24 hours: Temp:  [97.5 F (36.4 C)-98.3 F (36.8 C)] 97.7 F (36.5 C) (10/21 0517) Pulse Rate:  [69-80] 72 (10/21 0517) Resp:  [20] 20 (10/21 0517) BP: (107-118)/(50-60) 107/60 mmHg (10/21 0517) SpO2:  [97 %-100 %] 97 % (10/21 0517) Last BM Date: 10/27/12  Intake/Output from previous day: 10/20 0701 - 10/21 0700 In: 2200 [I.V.:2050; IV Piggyback:150] Out: 825 [Urine:825] Intake/Output this shift:    PE: Gen: Afebrile. Pleasant, NAD. CV: RRR Chest: CTAB. No wheeze or crackles.  Abd: soft. Diffusely tender; > RUQ. Murphy sign positive. ND. BS present. No masses palpated.  Ext: No erythema, edema or tenderness.   Lab Results:   Recent Labs  10/27/12 2301 10/29/12 0530  WBC 12.6* 9.7  HGB 10.3* 8.9*  HCT 31.1* 28.2*  PLT 685* 548*   BMET  Recent Labs  10/27/12 2301 10/29/12 0530  NA 137 137  K 3.6 4.1  CL 100 103  CO2 21 25  GLUCOSE 149* 82  BUN 12 12  CREATININE 0.64 0.77  CALCIUM 9.6 8.8   PT/INR No results found for this basename: LABPROT, INR,  in the last 72 hours CMP     Component Value Date/Time   NA 137 10/29/2012 0530   K 4.1 10/29/2012 0530   CL 103 10/29/2012 0530   CO2 25 10/29/2012 0530   GLUCOSE 82 10/29/2012 0530   BUN 12 10/29/2012 0530   CREATININE 0.77 10/29/2012 0530   CREATININE 0.68 07/17/2012 1126   CALCIUM 8.8 10/29/2012 0530   PROT 6.7 10/29/2012 0530   ALBUMIN 3.0* 10/29/2012 0530   AST 9 10/29/2012 0530   ALT 10 10/29/2012 0530   ALKPHOS 71 10/29/2012 0530   BILITOT 0.6 10/29/2012 0530   GFRNONAA >90  10/29/2012 0530   GFRAA >90 10/29/2012 0530   Lipase     Component Value Date/Time   LIPASE 23 10/27/2012 2301    Studies/Results: US Abdomen Complete  10/28/2012   CLINICAL DATA:  Abdominal pain and emesis for 4 days.  EXAM: ULTRASOUND ABDOMEN COMPLETE  COMPARISON:  CT abdomen and pelvis 10/2012.  FINDINGS: Gallbladder  Cholelithiasis and sludge in the gallbladder. Gallbladder wall thickness is upper limits of normal. Murphy's sign is positive.  Common bile duct  Diameter: 7.4 mm, upper limits of normal.  Liver  No focal lesion identified. Within normal limits in parenchymal echogenicity.  IVC  No abnormality visualized.  Pancreas  Visualized portion unremarkable.  Spleen  Size and appearance within normal limits.  Right Kidney  Length: 13.2 cm. Echogenicity within normal limits. No mass or hydronephrosis visualized.  Left Kidney  Length: 10.9 cm. Echogenicity within normal limits. No mass or hydronephrosis visualized.  Abdominal aorta  No aneurysm visualized.  IMPRESSION: Cholelithiasis with mild gallbladder wall thickening and positive Murphy's sign. Changes are consistent with acute cholecystitis in the appropriate clinical setting.   Electronically Signed   By: Burman Nieves M.D.   On: 10/28/2012 04:50   US Transvaginal Non-ob  10/28/2012   CLINICAL  DATA:  Abdominal pain and pelvic mass. Right-sided pelvic pain. History of right-sided oophorectomy and bilateral salpingectomy.  EXAM: TRANSABDOMINAL AND TRANSVAGINAL ULTRASOUND OF PELVIS  TECHNIQUE: Both transabdominal and transvaginal ultrasound examinations of the pelvis were performed. Transabdominal technique was performed for global imaging of the pelvis including uterus, ovaries, adnexal regions, and pelvic cul-de-sac. It was necessary to proceed with endovaginal exam following the transabdominal exam to visualize the endometrium and left adnexa.  COMPARISON:  CT abdomen and pelvis 10/28/2012  FINDINGS: Uterus  Measurements: 12.4 x 6.5 x  6.3 cm. Neutral configuration. Heterogeneous mass in the uterine fundus measuring 4.2 x 3.2 x 3 cm consistent with a fibroid.  Endometrium  Thickness: 2.2 mm. Small amount of fluid in the endocervical canal is nonspecific.  Right ovary  Surgically absent. No adnexal masses.  Left ovary  Measurements: 6.6 x 3.5 x 5.1 cm. Complex heterogeneous cystic and solid mass measuring 2.7 x 2.7 x 1.5 cm. No flow is demonstrated in the lesion. This corresponds to the abnormality seen and CT. This probably represents complex heterogeneous hemorrhagic cyst, but due to the heterogeneous appearance and possible solid component without vascularity, further evaluation is recommended. Consider MRI versus followup ultrasound in 6-12 weeks.  Other findings  Small amount of free fluid in the cul-de-sac.  IMPRESSION: Uterine fibroid. Surgical absence of the right ovary. Complex cystic and solid mass in the left ovary. Followup recommended with either MRI or followup ultrasound in 6-12 weeks.   Electronically Signed   By: Burman Nieves M.D.   On: 10/28/2012 05:07   US Pelvis Complete  10/28/2012   CLINICAL DATA:  Abdominal pain and pelvic mass. Right-sided pelvic pain. History of right-sided oophorectomy and bilateral salpingectomy.  EXAM: TRANSABDOMINAL AND TRANSVAGINAL ULTRASOUND OF PELVIS  TECHNIQUE: Both transabdominal and transvaginal ultrasound examinations of the pelvis were performed. Transabdominal technique was performed for global imaging of the pelvis including uterus, ovaries, adnexal regions, and pelvic cul-de-sac. It was necessary to proceed with endovaginal exam following the transabdominal exam to visualize the endometrium and left adnexa.  COMPARISON:  CT abdomen and pelvis 10/28/2012  FINDINGS: Uterus  Measurements: 12.4 x 6.5 x 6.3 cm. Neutral configuration. Heterogeneous mass in the uterine fundus measuring 4.2 x 3.2 x 3 cm consistent with a fibroid.  Endometrium  Thickness: 2.2 mm. Small amount of fluid in the  endocervical canal is nonspecific.  Right ovary  Surgically absent. No adnexal masses.  Left ovary  Measurements: 6.6 x 3.5 x 5.1 cm. Complex heterogeneous cystic and solid mass measuring 2.7 x 2.7 x 1.5 cm. No flow is demonstrated in the lesion. This corresponds to the abnormality seen and CT. This probably represents complex heterogeneous hemorrhagic cyst, but due to the heterogeneous appearance and possible solid component without vascularity, further evaluation is recommended. Consider MRI versus followup ultrasound in 6-12 weeks.  Other findings  Small amount of free fluid in the cul-de-sac.  IMPRESSION: Uterine fibroid. Surgical absence of the right ovary. Complex cystic and solid mass in the left ovary. Followup recommended with either MRI or followup ultrasound in 6-12 weeks.   Electronically Signed   By: Burman Nieves M.D.   On: 10/28/2012 05:07   Ct Abdomen Pelvis W Contrast  10/28/2012   *RADIOLOGY REPORT*  Clinical Data: Abdominal pain with nausea and vomiting.  Bilateral cell patent though the four rectum knee.  CT ABDOMEN AND PELVIS WITH CONTRAST  Technique:  Multidetector CT imaging of the abdomen and pelvis was performed following the standard protocol during  bolus administration of intravenous contrast.  Contrast: OMNIPAQUE IOHEXOL 300 MG/ML  SOLN  Comparison: CT of the abdomen and pelvis September 17, 2011.  Findings: Limited view of the lung bases demonstrates mild vascular congestion, borderline cardiomegaly.  The subpleural nodules seen previously is less conspicuous on this motion degraded examination.  The liver, spleen, pancreas and adrenal glands are unremarkable. Multiple gallstones again seen, without superimposed inflammatory changes.  Trace hiatal hernia.  The stomach, small and large bowel are normal in course and caliber without definite wall thickening or inflammatory changes though, sensitivity may be decreased as contrast has yet to reach the large bowel.  Kidneys  orthotopic, demonstrating symmetric enhancement, no nephrolithiasis, hydronephrosis or renal masses.  Prompt symmetric excretion into the proximal urinary collecting system.  Somewhat prominent heterogeneous uterus is unchanged, and on prior study better demonstrated a leiomyoma. Apparent partial resection of the left adnexal complex mass seen previously with a residual 2 cm fatty mass in the left pelvis, with surrounding apparent residual adnexal tissue. No residual fatty mass in right pelvis. Somewhat limited evaluation of the pelvis due to beam hardening artifact from right hip arthroplasty.  Urinary bladder is unremarkable.  No free fluid or suspicious fluid collections within the pelvis.  Mild fatty atrophy of the right piriformis muscle now noted. Smaller right psoas muscle is unchanged.  Severe lower lumbar facet arthropathy, resulting in severe L4-5 and L5 S1 neural foramen narrowing.  IMPRESSION: 2 cm fatty mass in the left pelvis, with surrounding apparent residual adnexal tissue, evaluation of pelvic structures limited by beam hardening artifact from right hip arthroplasty.  Recommend a follow-up pelvic ultrasound for further characterization.  No CT findings of residual right adnexal mass.  Cholelithiasis without CT findings of acute cholecystitis.   Original Report Authenticated By: Awilda Metro    Anti-infectives: Anti-infectives   Start     Dose/Rate Route Frequency Ordered Stop   10/28/12 0800  piperacillin-tazobactam (ZOSYN) IVPB 3.375 g     3.375 g 12.5 mL/hr over 240 Minutes Intravenous 3 times per day 10/28/12 0750        Assessment:  LEANETTE EUTSLER is 47 y.o. AAF admitted to surgical team for acute cholecystitis.  1. Acute cholecystitis with cholelithiasis 2. 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 S/p right ovarian cystectomy and endometrial ablation 01/2012 4. Constipation 5. Leukocytosis  Plan:  1. Acute cholecystitis with cholelithiasis: started on IV  zosyn 10/20. Laparoscopic cholecystectomy scheduled for Thursday. Once time is posted patient would like to be made aware, so that she may inform her mother. Abdominal pain mostly controlled with IV Dilaudid, will add back home tramadol and robaxin for hip pain.   2. 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 Patient was performing home PT prior to admission. Added back tramadol and robaxin for breakthrough hip discomfort.  3. Constipation: Patient has not had a BM since Sunday. Will start stool softer and miralax PRN.  4. Leukocytosis: Trending down with ABX coverage of zosyn (10/20) 5. Diet: Advanced diet to clear liquids, with NPO after midnight ordered for Wednesday.    LOS: 2 days    Felix Pacini 10/29/2012, 8:49 AM Pager: [phone]

## 2012-10-30 LAB — OCCULT BLOOD X 1 CARD TO LAB, STOOL: Fecal Occult Bld: NEGATIVE

## 2012-10-30 NOTE — Progress Notes (Signed)
Patient interviewed and examined, agree with resident note above.  Mariella Saa MD, FACS  10/30/2012 11:58 AM

## 2012-10-30 NOTE — Progress Notes (Signed)
Patient had a red, watery stool.  Dr. Corliss Skains notified; new order received to check occult blood in stool.  Will continue to monitor.

## 2012-10-30 NOTE — Progress Notes (Signed)
Patient ID: Alyssa Rangel, female   DOB: February 04, 1965, 47 y.o.   MRN: [id number]    Subjective: Pt reports she is doing better today. She has ambulated well with PT, tolerated her clear liquid diet yesterday. She reports a large BM yesterday evening.  Objective: Vital signs in last 24 hours: Temp:  [97.8 F (36.6 C)-98.3 F (36.8 C)] 97.8 F (36.6 C) (10/22 0538) Pulse Rate:  [81-90] 81 (10/22 0538) Resp:  [18-19] 18 (10/22 0538) BP: (124-140)/(53-71) 134/53 mmHg (10/22 0538) SpO2:  [99 %-100 %] 99 % (10/22 0538) Last BM Date: 10/29/12  Intake/Output from previous day: 10/21 0701 - 10/22 0700 In: 2052.1 [I.V.:1952.1; IV Piggyback:100] Out: 300 [Urine:300] Intake/Output this shift:    PE: Gen: Afebrile. Pleasant, NAD. CV: RRR Chest: CTAB. No wheeze or crackles.  Abd: soft. RUQ/RLQ pain . Murphy sign positive. ND. BS present. No masses palpated.  Ext: No erythema, edema or tenderness.   Lab Results:   Recent Labs  10/27/12 2301 10/29/12 0530  WBC 12.6* 9.7  HGB 10.3* 8.9*  HCT 31.1* 28.2*  PLT 685* 548*   BMET  Recent Labs  10/27/12 2301 10/29/12 0530  NA 137 137  K 3.6 4.1  CL 100 103  CO2 21 25  GLUCOSE 149* 82  BUN 12 12  CREATININE 0.64 0.77  CALCIUM 9.6 8.8   PT/INR No results found for this basename: LABPROT, INR,  in the last 72 hours CMP     Component Value Date/Time   NA 137 10/29/2012 0530   K 4.1 10/29/2012 0530   CL 103 10/29/2012 0530   CO2 25 10/29/2012 0530   GLUCOSE 82 10/29/2012 0530   BUN 12 10/29/2012 0530   CREATININE 0.77 10/29/2012 0530   CREATININE 0.68 07/17/2012 1126   CALCIUM 8.8 10/29/2012 0530   PROT 6.7 10/29/2012 0530   ALBUMIN 3.0* 10/29/2012 0530   AST 9 10/29/2012 0530   ALT 10 10/29/2012 0530   ALKPHOS 71 10/29/2012 0530   BILITOT 0.6 10/29/2012 0530   GFRNONAA >90 10/29/2012 0530   GFRAA >90 10/29/2012 0530   Lipase     Component Value Date/Time   LIPASE 23 10/27/2012 2301    Studies/Results: No  results found.  Anti-infectives: Anti-infectives   Start     Dose/Rate Route Frequency Ordered Stop   10/28/12 0800  piperacillin-tazobactam (ZOSYN) IVPB 3.375 g     3.375 g 12.5 mL/hr over 240 Minutes Intravenous 3 times per day 10/28/12 0750        Assessment:  Alyssa Rangel is 47 y.o. AAF admitted to surgical team for acute cholecystitis.  1. Acute cholecystitis with cholelithiasis 2. 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 S/p right ovarian cystectomy and endometrial ablation 01/2012 4. Constipation- resolved with bowel regimen. 5. Leukocytosis  Plan:  1. Acute cholecystitis with cholelithiasis: started on IV zosyn 10/20. Laparoscopic cholecystectomy scheduled for Thursday. Once time is posted patient would like to be made aware, so that she may inform her mother. Pain controlled. 2. 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 She is more comfortable with tramadol and robaxin added yesterday.  3. Leukocytosis: Trending down with ABX coverage of zosyn (10/20) 4. Diet: clear liquids, with NPO after midnight ordered for Tonight.  5. Pre-op labs/consent ordered   LOS: 3 days    Felix Pacini 10/30/2012, 8:42 AM Pager: [phone]

## 2012-10-30 NOTE — Progress Notes (Signed)
Physical Therapy Treatment Patient Details Name: SILVANNA OHMER MRN: [id number] DOB: [birth year] Today's Date: 10/30/2012 Time: 4098-1191 PT Time Calculation (min): 15 min  PT Assessment / Plan / Recommendation  History of Present Illness This is a 47 year old female who presents with the sudden onset of epigastric abdominal pain hurting through to her back. She also developed multiple episodes of nausea and vomiting. This began yesterday. The pain was sharp and constant as well as moderate to severe. She has no previous attacks. She is less than 3 weeks out from hip replacement surgery. She is taking blood thinning medication which was to stop tomorrow. She is otherwise without complaints.   PT Comments   Patient up in room upon arrival. Patient breaking flexion precautions by reaching to get call bell off the floor. Patient needing more assist today with use of cane. Limited by slight dizziness but subsided quickly with rest  Follow Up Recommendations  Home health PT;Supervision for mobility/OOB     Does the patient have the potential to tolerate intense rehabilitation     Barriers to Discharge        Equipment Recommendations  None recommended by PT    Recommendations for Other Services    Frequency Min 4X/week   Progress towards PT Goals Progress towards PT goals: Progressing toward goals  Plan Current plan remains appropriate    Precautions / Restrictions Precautions Precautions: Fall;Posterior Hip Restrictions Other Position/Activity Restrictions: WBAT   Pertinent Vitals/Pain no apparent distress    Mobility  Bed Mobility Bed Mobility: Not assessed Transfers Sit to Stand: 6: Modified independent (Device/Increase time) Stand to Sit: 6: Modified independent (Device/Increase time) Ambulation/Gait Ambulation/Gait Assistance: 4: Min assist Ambulation Distance (Feet): 300 Feet Assistive device: Straight cane Ambulation/Gait Assistance Details: Min A with cane today  due to decrease balance. Limited by dizziness and returned to recliner Gait Pattern: Step-through pattern;Decreased stance time - right;Antalgic Gait velocity: decreased    Exercises     PT Diagnosis:    PT Problem List:   PT Treatment Interventions:     PT Goals (current goals can now be found in the care plan section)    Visit Information  Last PT Received On: 10/30/12 Assistance Needed: +1 History of Present Illness: This is a 47 year old female who presents with the sudden onset of epigastric abdominal pain hurting through to her back. She also developed multiple episodes of nausea and vomiting. This began yesterday. The pain was sharp and constant as well as moderate to severe. She has no previous attacks. She is less than 3 weeks out from hip replacement surgery. She is taking blood thinning medication which was to stop tomorrow. She is otherwise without complaints.    Subjective Data      Cognition  Cognition Arousal/Alertness: Awake/alert Behavior During Therapy: WFL for tasks assessed/performed Overall Cognitive Status: Within Functional Limits for tasks assessed    Balance     End of Session PT - End of Session Equipment Utilized During Treatment: Gait belt Activity Tolerance: Patient tolerated treatment well Patient left: in chair;with call bell/phone within reach;with family/visitor present Nurse Communication: Mobility status   GP     Fredrich Birks 10/30/2012, 1:58 PM 10/30/2012 Fredrich Birks PTA [phone] pager [phone] office

## 2012-10-31 ENCOUNTER — Encounter (HOSPITAL_COMMUNITY): Payer: Self-pay | Admitting: Anesthesiology

## 2012-10-31 ENCOUNTER — Inpatient Hospital Stay (HOSPITAL_COMMUNITY): Payer: 59 | Admitting: Anesthesiology

## 2012-10-31 ENCOUNTER — Encounter (HOSPITAL_COMMUNITY): Admission: EM | Disposition: A | Discharge status: Home Health | Payer: Self-pay | Source: Home / Self Care

## 2012-10-31 ENCOUNTER — Inpatient Hospital Stay (HOSPITAL_COMMUNITY): Payer: 59

## 2012-10-31 ENCOUNTER — Encounter (HOSPITAL_COMMUNITY): Payer: 59 | Admitting: Anesthesiology

## 2012-10-31 DIAGNOSIS — K8010 Calculus of gallbladder with chronic cholecystitis without obstruction: Secondary | ICD-10-CM

## 2012-10-31 HISTORY — PX: CHOLECYSTECTOMY: SHX55

## 2012-10-31 LAB — BASIC METABOLIC PANEL
CO2: 21 mEq/L (ref 19–32)
Calcium: 8.7 mg/dL (ref 8.4–10.5)
Chloride: 104 mEq/L (ref 96–112)
Creatinine, Ser: 0.69 mg/dL (ref 0.50–1.10)
GFR calc Af Amer: >90 mL/min (ref >90)
Glucose, Bld: 86 mg/dL (ref 70–99)
Potassium: 4.9 mEq/L (ref 3.5–5.1)
Sodium: 136 mEq/L (ref 135–145)

## 2012-10-31 LAB — CBC WITH DIFFERENTIAL/PLATELET
Basophils Absolute: 0 10*3/uL (ref 0.0–0.1)
Basophils Relative: 0 % (ref 0–1)
HCT: 28.2 % — ABNORMAL LOW (ref 36.0–46.0)
Lymphocytes Relative: 38 % (ref 12–46)
MCH: 26.1 pg (ref 26.0–34.0)
MCHC: 32.3 g/dL (ref 30.0–36.0)
Monocytes Absolute: 0.7 10*3/uL (ref 0.1–1.0)
Neutro Abs: 3.5 10*3/uL (ref 1.7–7.7)
Neutrophils Relative %: 49 % (ref 43–77)
Platelets: 532 10*3/uL — ABNORMAL HIGH (ref 150–400)
RDW: 14.4 % (ref 11.5–15.5)
WBC: 7.1 10*3/uL (ref 4.0–10.5)

## 2012-10-31 LAB — TYPE AND SCREEN
ABO/RH(D): A POS
Antibody Screen: NEGATIVE

## 2012-10-31 LAB — ABO/RH: ABO/RH(D): A POS

## 2012-10-31 SURGERY — LAPAROSCOPIC CHOLECYSTECTOMY WITH INTRAOPERATIVE CHOLANGIOGRAM
Anesthesia: General | Site: Abdomen | Wound class: Contaminated

## 2012-10-31 MED ORDER — HEPARIN SODIUM (PORCINE) 5000 UNIT/ML IJ SOLN
5000.0000 [IU] | Freq: Three times a day (TID) | INTRAMUSCULAR | Status: AC
  Administered 2012-10-31: 5000 [IU] via SUBCUTANEOUS
  Filled 2012-10-31: qty 1

## 2012-10-31 MED ORDER — ROCURONIUM BROMIDE 100 MG/10ML IV SOLN
INTRAVENOUS | Status: DC | PRN
  Administered 2012-10-31: 50 mg via INTRAVENOUS

## 2012-10-31 MED ORDER — GLYCOPYRROLATE 0.2 MG/ML IJ SOLN
INTRAMUSCULAR | Status: DC | PRN
  Administered 2012-10-31: 0.4 mg via INTRAVENOUS

## 2012-10-31 MED ORDER — OXYCODONE HCL 5 MG PO TABS
ORAL_TABLET | ORAL | Status: AC
  Filled 2012-10-31: qty 1

## 2012-10-31 MED ORDER — HYDROMORPHONE HCL PF 1 MG/ML IJ SOLN
INTRAMUSCULAR | Status: AC
  Filled 2012-10-31: qty 1

## 2012-10-31 MED ORDER — NEOSTIGMINE METHYLSULFATE 1 MG/ML IJ SOLN
INTRAMUSCULAR | Status: DC | PRN
  Administered 2012-10-31: 3 mg via INTRAVENOUS

## 2012-10-31 MED ORDER — 0.9 % SODIUM CHLORIDE (POUR BTL) OPTIME
TOPICAL | Status: DC | PRN
  Administered 2012-10-31 (×2): 1000 mL

## 2012-10-31 MED ORDER — HYDROMORPHONE HCL PF 1 MG/ML IJ SOLN
INTRAMUSCULAR | Status: AC
  Administered 2012-10-31: 0.5 mg via INTRAVENOUS
  Filled 2012-10-31: qty 1

## 2012-10-31 MED ORDER — LABETALOL HCL 5 MG/ML IV SOLN
INTRAVENOUS | Status: DC | PRN
  Administered 2012-10-31 (×2): 5 mg via INTRAVENOUS

## 2012-10-31 MED ORDER — HYDROMORPHONE HCL PF 1 MG/ML IJ SOLN
0.5000 mg | INTRAMUSCULAR | Status: DC | PRN
  Administered 2012-10-31 – 2012-11-01 (×4): 1 mg via INTRAVENOUS
  Filled 2012-10-31 (×3): qty 1

## 2012-10-31 MED ORDER — RIVAROXABAN 10 MG PO TABS: 10.0000 mg | ORAL_TABLET | Freq: Every day | ORAL | Status: DC

## 2012-10-31 MED ORDER — IOHEXOL 300 MG/ML  SOLN
INTRAMUSCULAR | Status: DC | PRN
  Administered 2012-10-31: 15:00:00

## 2012-10-31 MED ORDER — OXYCODONE HCL 5 MG PO TABS
5.0000 mg | ORAL_TABLET | Freq: Once | ORAL | Status: AC | PRN
  Administered 2012-10-31: 5 mg via ORAL

## 2012-10-31 MED ORDER — ONDANSETRON HCL 4 MG/2ML IJ SOLN: 4.0000 mg | Freq: Once | INTRAMUSCULAR | Status: DC | PRN

## 2012-10-31 MED ORDER — ONDANSETRON HCL 4 MG/2ML IJ SOLN
INTRAMUSCULAR | Status: DC | PRN
  Administered 2012-10-31: 4 mg via INTRAVENOUS

## 2012-10-31 MED ORDER — LACTATED RINGERS IV SOLN
INTRAVENOUS | Status: DC | PRN
  Administered 2012-10-31 (×2): via INTRAVENOUS

## 2012-10-31 MED ORDER — PIPERACILLIN-TAZOBACTAM 3.375 G IVPB 30 MIN
3.3750 g | INTRAVENOUS | Status: AC
  Administered 2012-10-31: 3.375 g via INTRAVENOUS
  Filled 2012-10-31: qty 50

## 2012-10-31 MED ORDER — VECURONIUM BROMIDE 10 MG IV SOLR
INTRAVENOUS | Status: DC | PRN
  Administered 2012-10-31: 1 mg via INTRAVENOUS

## 2012-10-31 MED ORDER — FENTANYL CITRATE 0.05 MG/ML IJ SOLN
INTRAMUSCULAR | Status: DC | PRN
  Administered 2012-10-31 (×2): 50 ug via INTRAVENOUS
  Administered 2012-10-31: 100 ug via INTRAVENOUS
  Administered 2012-10-31 (×2): 50 ug via INTRAVENOUS

## 2012-10-31 MED ORDER — BUPIVACAINE-EPINEPHRINE (PF) 0.5% -1:200000 IJ SOLN
INTRAMUSCULAR | Status: AC
  Filled 2012-10-31: qty 10

## 2012-10-31 MED ORDER — OXYCODONE-ACETAMINOPHEN 5-325 MG PO TABS
ORAL_TABLET | ORAL | Status: AC
  Filled 2012-10-31: qty 1

## 2012-10-31 MED ORDER — OXYCODONE HCL 5 MG/5ML PO SOLN: 5.0000 mg | Freq: Once | ORAL | Status: AC | PRN

## 2012-10-31 MED ORDER — BUPIVACAINE-EPINEPHRINE 0.5% -1:200000 IJ SOLN
INTRAMUSCULAR | Status: DC | PRN
Start: 2012-10-31 — End: 2012-10-31
  Administered 2012-10-31: 15 mL

## 2012-10-31 MED ORDER — POTASSIUM CHLORIDE IN NACL 20-0.9 MEQ/L-% IV SOLN
INTRAVENOUS | Status: DC
  Administered 2012-10-31: 50 mL/h via INTRAVENOUS
  Administered 2012-11-01: 10:00:00 via INTRAVENOUS
  Filled 2012-10-31 (×2): qty 1000

## 2012-10-31 MED ORDER — PROPOFOL 10 MG/ML IV BOLUS
INTRAVENOUS | Status: DC | PRN
  Administered 2012-10-31: 130 mg via INTRAVENOUS

## 2012-10-31 MED ORDER — OXYCODONE-ACETAMINOPHEN 5-325 MG PO TABS
1.0000 | ORAL_TABLET | ORAL | Status: DC | PRN
  Administered 2012-10-31: 1 via ORAL
  Administered 2012-11-01 (×5): 2 via ORAL
  Filled 2012-10-31 (×5): qty 2

## 2012-10-31 MED ORDER — LIDOCAINE HCL (CARDIAC) 20 MG/ML IV SOLN
INTRAVENOUS | Status: DC | PRN
  Administered 2012-10-31: 40 mg via INTRAVENOUS

## 2012-10-31 MED ORDER — LACTATED RINGERS IV SOLN
INTRAVENOUS | Status: DC
  Administered 2012-10-31: 14:00:00 via INTRAVENOUS

## 2012-10-31 MED ORDER — MIDAZOLAM HCL 5 MG/5ML IJ SOLN
INTRAMUSCULAR | Status: DC | PRN
  Administered 2012-10-31: 2 mg via INTRAVENOUS

## 2012-10-31 MED ORDER — HYDROMORPHONE HCL PF 1 MG/ML IJ SOLN
0.2500 mg | INTRAMUSCULAR | Status: DC | PRN
  Administered 2012-10-31: 0.25 mg via INTRAVENOUS
  Administered 2012-10-31 (×2): 0.5 mg via INTRAVENOUS
  Administered 2012-10-31: 0.25 mg via INTRAVENOUS

## 2012-10-31 MED ORDER — SODIUM CHLORIDE 0.9 % IR SOLN
Status: DC | PRN
  Administered 2012-10-31: 1

## 2012-10-31 SURGICAL SUPPLY — 48 items
APPLIER CLIP ROT 10 11.4 M/L (STAPLE) ×2
BLADE SURG ROTATE 9660 (MISCELLANEOUS) IMPLANT
CANISTER SUCTION 2500CC (MISCELLANEOUS) ×2 IMPLANT
CHLORAPREP W/TINT 26ML (MISCELLANEOUS) ×2 IMPLANT
CLIP APPLIE ROT 10 11.4 M/L (STAPLE) ×1 IMPLANT
COVER MAYO STAND STRL (DRAPES) ×2 IMPLANT
COVER SURGICAL LIGHT HANDLE (MISCELLANEOUS) ×2 IMPLANT
DECANTER SPIKE VIAL GLASS SM (MISCELLANEOUS) ×2 IMPLANT
DERMABOND ADVANCED (GAUZE/BANDAGES/DRESSINGS) ×1
DERMABOND ADVANCED .7 DNX12 (GAUZE/BANDAGES/DRESSINGS) ×1 IMPLANT
DRAPE C-ARM 42X72 X-RAY (DRAPES) ×2 IMPLANT
DRAPE CAMERA CLOSED 9X96 (DRAPES) ×2 IMPLANT
DRAPE UTILITY 15X26 W/TAPE STR (DRAPE) ×4 IMPLANT
ELECT REM PT RETURN 9FT ADLT (ELECTROSURGICAL) ×2
ELECTRODE REM PT RTRN 9FT ADLT (ELECTROSURGICAL) ×1 IMPLANT
GLOVE BIO SURGEON STRL SZ 6 (GLOVE) ×2 IMPLANT
GLOVE BIO SURGEON STRL SZ 6.5 (GLOVE) ×2 IMPLANT
GLOVE BIO SURGEON STRL SZ7 (GLOVE) ×2 IMPLANT
GLOVE BIOGEL PI IND STRL 6 (GLOVE) ×1 IMPLANT
GLOVE BIOGEL PI IND STRL 7.0 (GLOVE) ×1 IMPLANT
GLOVE BIOGEL PI IND STRL 8 (GLOVE) ×3 IMPLANT
GLOVE BIOGEL PI INDICATOR 6 (GLOVE) ×1
GLOVE BIOGEL PI INDICATOR 7.0 (GLOVE) ×1
GLOVE BIOGEL PI INDICATOR 8 (GLOVE) ×3
GLOVE ECLIPSE 7.0 STRL STRAW (GLOVE) ×4 IMPLANT
GLOVE SS BIOGEL STRL SZ 7.5 (GLOVE) ×1 IMPLANT
GLOVE SUPERSENSE BIOGEL SZ 7.5 (GLOVE) ×1
GLOVE SURG SS PI 6.5 STRL IVOR (GLOVE) ×2 IMPLANT
GLOVE SURG SS PI 7.0 STRL IVOR (GLOVE) ×2 IMPLANT
GOWN STRL NON-REIN LRG LVL3 (GOWN DISPOSABLE) ×6 IMPLANT
GOWN STRL REIN XL XLG (GOWN DISPOSABLE) ×2 IMPLANT
KIT BASIN OR (CUSTOM PROCEDURE TRAY) ×2 IMPLANT
KIT ROOM TURNOVER OR (KITS) ×2 IMPLANT
NS IRRIG 1000ML POUR BTL (IV SOLUTION) ×2 IMPLANT
PAD ARMBOARD 7.5X6 YLW CONV (MISCELLANEOUS) ×2 IMPLANT
POUCH SPECIMEN RETRIEVAL 10MM (ENDOMECHANICALS) ×2 IMPLANT
SCISSORS LAP 5X35 DISP (ENDOMECHANICALS) IMPLANT
SET CHOLANGIOGRAPH 5 50 .035 (SET/KITS/TRAYS/PACK) ×4 IMPLANT
SET IRRIG TUBING LAPAROSCOPIC (IRRIGATION / IRRIGATOR) ×2 IMPLANT
SLEEVE ENDOPATH XCEL 5M (ENDOMECHANICALS) ×2 IMPLANT
SPECIMEN JAR SMALL (MISCELLANEOUS) ×2 IMPLANT
SUT MON AB 5-0 PS2 18 (SUTURE) ×2 IMPLANT
TOWEL OR 17X24 6PK STRL BLUE (TOWEL DISPOSABLE) ×2 IMPLANT
TOWEL OR 17X26 10 PK STRL BLUE (TOWEL DISPOSABLE) ×2 IMPLANT
TRAY LAPAROSCOPIC (CUSTOM PROCEDURE TRAY) ×2 IMPLANT
TROCAR XCEL BLUNT TIP 100MML (ENDOMECHANICALS) ×2 IMPLANT
TROCAR XCEL NON-BLD 11X100MML (ENDOMECHANICALS) ×2 IMPLANT
TROCAR XCEL NON-BLD 5MMX100MML (ENDOMECHANICALS) ×2 IMPLANT

## 2012-10-31 NOTE — Anesthesia Procedure Notes (Signed)
Procedure Name: Intubation Date/Time: 10/31/2012 2:09 PM Performed by: Lovie Chol Pre-anesthesia Checklist: Patient identified, Emergency Drugs available, Suction available, Patient being monitored and Timeout performed Patient Re-evaluated:Patient Re-evaluated prior to inductionOxygen Delivery Method: Circle system utilized Preoxygenation: Pre-oxygenation with 100% oxygen Intubation Type: IV induction Ventilation: Mask ventilation without difficulty and Oral airway inserted - appropriate to patient size Laryngoscope Size: Miller and 2 Grade View: Grade II Tube type: Oral Tube size: 7.0 mm Number of attempts: 1 Airway Equipment and Method: Stylet Placement Confirmation: positive ETCO2,  CO2 detector,  ETT inserted through vocal cords under direct vision and breath sounds checked- equal and bilateral Secured at: 21 cm Tube secured with: Tape Dental Injury: Teeth and Oropharynx as per pre-operative assessment

## 2012-10-31 NOTE — Op Note (Signed)
Preoperative Diagnosis: Cholecystitis, acute with cholelithiasis [574.30]  Postoprative Diagnosis: Cholecystitis, acute with cholelithiasis [574.30]  Procedure: Procedure(s): LAPAROSCOPIC CHOLECYSTECTOMY WITH INTRAOPERATIVE CHOLANGIOGRAM   Surgeon: Glenna Fellows T   Assistants: Barnetta Chapel PA  Anesthesia:  General endotracheal anesthesia  Indications: patient is a 47 year old female status post recent total hip replacement on anticoagulation who presents with acute epigastric and right upper quadrant abdominal pain, elevated white count and ultrasound showing cholelithiasis and evidence of acute cholecystitis. She was admitted and placed on antibiotics while her anticoagulant was allowed to reverse. We have recommended proceeding with laparoscopic and possible open cholecystectomy.I discussed the procedure in detail.   We discussed the risks and benefits of a laparoscopic cholecystectomy and possible cholangiogram including, but not limited to bleeding, infection, injury to surrounding structures such as the intestine or liver, bile leak, retained gallstones, need to convert to an open procedure, prolonged diarrhea, blood clots such as  DVT, common bile duct injury, anesthesia risks, and possible need for additional procedures.  The likelihood of improvement in symptoms and return to the patient's normal status is good. We discussed the typical post-operative recovery course.    Procedure Detail:  Patient was brought to the operating room, placed in supine position on the operating table, and general endotracheal anesthesia induced. She was on broad-spectrum IV antibiotics. PAS were in place. The abdomen was widely sterilely prepped and draped. The patient, was performed and correct procedure verified. Access was obtained with a  1 1/2 cm incision above the umbilicus due to obesity and dissection carried down to midline fascia which was incised for 1 cm and the peritoneum entered directly.  Through mattress suture of 0 Vicryl Hassan trocar was placed and pneumoperitoneum established. There was noted to be a lot of chronic scarring in the right upper quadrant with some areas of fat necrosis around the liver and omentum and the liver densely adherent to the peritoneum and diaphragm. The gallbladder was seen to be thickened and opaque with chronic inflammation and some edema. Initially I began to take the liver down off of the abdominal wall but these were dense adhesions and it appeared that we could expose the gallbladder adequately without this. The fundus was grasped and retracted inferior laterally. Some omental adhesions were taken down off of the liver and gallbladder with sharp and cautery dissection. The infundibulum was able to be grasped with good exposure of the porta hepatis and close triangle. There was a lot of chronic thickening in this area. Peritoneum anterior and posterior to the infundibulum of the gallbladder was incised and the distal gallbladder was carefully dissected away from the liver. Working on the gallbladder wall down toward the porta hepatis dissection was carried carefully toward the cystic duct and artery. The gallbladder wall was identified medially and staying on this identify the cystic artery coursing up onto the gallbladder wall and this was divided between 2 proximal one distal clip. Then continuing dissected down the gallbladder tapered to a normal sized cystic duct. We had a good 360 view of the cystic duct gallbladder junction. The cystic duct was clipped at the gallbladder junction and an operative cholangiogram obtained through the cystic duct showing good filling of the common bile duct and intrahepatic ducts with free flow to the duodenum and no filling defects. The cholangiogram was removed and the cystic duct was triply clipped proximally. The gallbladder was then dissected free from the liver bed using hook cautery and placed in an Endo Catch bag. It was  brought  out through the umbilical incision after removing numerous stones. Complete hemostasis was assured in the gallbladder bed. There was no evidence of trocar injury or bleeding or other complication. All CO2 was evacuated and trochars removed and the mattress suture secured at the umbilicus. Skin incisions were closed with subcuticular Monocryl and Dermabond. Sponge needle and instrument counts were correct.    Findings: As above  Estimated Blood Loss:  Minimal         Drains: none  Blood Given: none          Specimens: gallbladder and contents        Complications:  * No complications entered in OR log *         Disposition: PACU - hemodynamically stable.         Condition: stable

## 2012-10-31 NOTE — Preoperative (Signed)
Beta Blockers   Reason not to administer Beta Blockers:Not Applicable 

## 2012-10-31 NOTE — Anesthesia Preprocedure Evaluation (Addendum)
Anesthesia Evaluation  Patient identified by MRN, date of birth, ID band Patient awake    Reviewed: Allergy & Precautions, H&P , NPO status , Patient's Chart, lab work & pertinent test results  History of Anesthesia Complications Negative for: history of anesthetic complications  Airway Mallampati: II TM Distance: >3 FB Neck ROM: Full    Dental  (+) Teeth Intact, Poor Dentition and Dental Advisory Given   Pulmonary former smoker,  breath sounds clear to auscultation        Cardiovascular Rhythm:Regular Rate:Normal     Neuro/Psych negative neurological ROS     GI/Hepatic negative GI ROS, Neg liver ROS,   Endo/Other  negative endocrine ROS  Renal/GU negative Renal ROS     Musculoskeletal  (+) Arthritis -, Rheumatoid disorders,    Abdominal   Peds  Hematology negative hematology ROS (+)   Anesthesia Other Findings   Reproductive/Obstetrics negative OB ROS                           Anesthesia Physical Anesthesia Plan  ASA: II  Anesthesia Plan: General   Post-op Pain Management:    Induction: Intravenous  Airway Management Planned: Oral ETT  Additional Equipment:   Intra-op Plan:   Post-operative Plan: Extubation in OR  Informed Consent: I have reviewed the patients History and Physical, chart, labs and discussed the procedure including the risks, benefits and alternatives for the proposed anesthesia with the patient or authorized representative who has indicated his/her understanding and acceptance.   Dental advisory given  Plan Discussed with: CRNA and Anesthesiologist  Anesthesia Plan Comments: (Cholelithiasis with biliary colic Former smoker S/P R.THR 10/09/12  Plan GA with oral ETT  Kipp Brood, MD)       Anesthesia Quick Evaluation

## 2012-10-31 NOTE — Progress Notes (Signed)
Chaplain offered ministry of presence, emotional and spiritual support to the patient. Patient's mother and two sisters were present. Chaplain will follow up as needed or requested.   10/31/12 1100  Clinical Encounter Type  Visited With Patient and family together  Visit Type Initial;Social support  Spiritual Encounters  Spiritual Needs Emotional

## 2012-10-31 NOTE — Transfer of Care (Signed)
Immediate Anesthesia Transfer of Care Note  Patient: [name]  Procedure(s) Performed: Procedure(s): LAPAROSCOPIC CHOLECYSTECTOMY WITH INTRAOPERATIVE CHOLANGIOGRAM (N/A)  Patient Location: PACU  Anesthesia Type:General  Level of Consciousness: awake, oriented and patient cooperative  Airway & Oxygen Therapy: Patient Spontanous Breathing and Patient connected to nasal cannula oxygen  Post-op Assessment: Report given to PACU RN and Post -op Vital signs reviewed and stable  Post vital signs: Reviewed  Complications: No apparent anesthesia complications

## 2012-10-31 NOTE — Progress Notes (Signed)
Patient ID: Alyssa Rangel, female   DOB: 1965-03-02, 47 y.o.   MRN: [id number]    Subjective: Pt reports she is feeling good today. She has continued to tolerate her clear liquids today. She still endorses RUQ pain under rib cage that is controlled with medication. She is ready to have surgery today.   Patient had 2 episodes of red colored BM yesterday, after eating red jello on her clear liquid tray; Specimen sent to lab, negative for blood.   Objective: Vital signs in last 24 hours: Temp:  [98.1 F (36.7 C)-98.2 F (36.8 C)] 98.2 F (36.8 C) (10/23 0538) Pulse Rate:  [64-81] 71 (10/23 0538) Resp:  [18-20] 18 (10/22 2133) BP: (109-129)/(60-100) 120/66 mmHg (10/23 0538) SpO2:  [99 %-100 %] 100 % (10/23 0538) Last BM Date: 10/30/12  Intake/Output from previous day: 10/22 0701 - 10/23 0700 In: 2700 [P.O.:700; I.V.:2000] Out: 1000 [Urine:1000] Intake/Output this shift:    PE: Gen: Afebrile. Pleasant, NAD. CV: RRR Chest: CTAB. No wheeze or crackles.  Abd: soft. RUQ/RLQ pain . Murphy sign positive. ND. BS present. No masses palpated.  Ext: No erythema, edema or tenderness.   Lab Results:   Recent Labs  10/29/12 0530 10/31/12 0451  WBC 9.7 7.1  HGB 8.9* 9.1*  HCT 28.2* 28.2*  PLT 548* 532*   BMET  Recent Labs  10/29/12 0530 10/31/12 0451  NA 137 136  K 4.1 4.9  CL 103 104  CO2 25 21  GLUCOSE 82 86  BUN 12 3*  CREATININE 0.77 0.69  CALCIUM 8.8 8.7   PT/INR No results found for this basename: LABPROT, INR,  in the last 72 hours CMP     Component Value Date/Time   NA 136 10/31/2012 0451   K 4.9 10/31/2012 0451   CL 104 10/31/2012 0451   CO2 21 10/31/2012 0451   GLUCOSE 86 10/31/2012 0451   BUN 3* 10/31/2012 0451   CREATININE 0.69 10/31/2012 0451   CREATININE 0.68 07/17/2012 1126   CALCIUM 8.7 10/31/2012 0451   PROT 6.7 10/29/2012 0530   ALBUMIN 3.0* 10/29/2012 0530   AST 9 10/29/2012 0530   ALT 10 10/29/2012 0530   ALKPHOS 71 10/29/2012 0530   BILITOT 0.6 10/29/2012 0530   GFRNONAA >90 10/31/2012 0451   GFRAA >90 10/31/2012 0451   Lipase     Component Value Date/Time   LIPASE 23 10/27/2012 2301    Studies/Results: No results found.  Anti-infectives: Anti-infectives   Start     Dose/Rate Route Frequency Ordered Stop   10/28/12 0800  piperacillin-tazobactam (ZOSYN) IVPB 3.375 g     3.375 g 12.5 mL/hr over 240 Minutes Intravenous 3 times per day 10/28/12 0750        Assessment:  Alyssa Rangel is 47 y.o. AAF admitted to surgical team for acute cholecystitis.  1. Acute cholecystitis with cholelithiasis 2. 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 S/p right ovarian cystectomy and endometrial ablation 01/2012 4. Constipation- resolved with bowel regimen. 5. Leukocytosis - resolved with Zosyn 6. Normocytic Anemia  Plan:  1. Acute cholecystitis with cholelithiasis: IV zosyn started 10/20. Laparoscopic cholecystectomy scheduled for today at 2 pm, patient aware.  2. 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  3. Leukocytosis: resolved with  zosyn (10/20); will continue antibiotics today.  4. Normocytic anemia: Stopped Xarelto on admission, typed and screened today for surgery in the event she would require transfusion.  Anemia likely d/t to blood loss in recent hip surgery. No signs of blood loss d/t GI issues. FOBT negative yesterday.  5. Diet: NPO, until after surgery and then will advance slowly.    LOS: 4 days    Felix Pacini 10/31/2012, 7:48 AM Pager: [phone]

## 2012-10-31 NOTE — Anesthesia Postprocedure Evaluation (Signed)
  Anesthesia Post-op Note  Patient: Alyssa Rangel  Procedure(s) Performed: Procedure(s): LAPAROSCOPIC CHOLECYSTECTOMY WITH INTRAOPERATIVE CHOLANGIOGRAM (N/A)  Patient Location: PACU  Anesthesia Type:General  Level of Consciousness: awake  Airway and Oxygen Therapy: Patient Spontanous Breathing  Post-op Pain: mild  Post-op Assessment: Post-op Vital signs reviewed  Post-op Vital Signs: Reviewed  Complications: No apparent anesthesia complications

## 2012-10-31 NOTE — Progress Notes (Signed)
Patient interviewed and examined, agree with resident note above.  Mariella Saa MD, FACS  10/31/2012 10:09 AM

## 2012-11-01 ENCOUNTER — Encounter (HOSPITAL_COMMUNITY): Payer: Self-pay | Admitting: General Surgery

## 2012-11-01 MED ORDER — ASPIRIN 81 MG PO CHEW
81.0000 mg | CHEWABLE_TABLET | Freq: Every day | ORAL | Status: DC
  Administered 2012-11-01: 81 mg via ORAL
  Filled 2012-11-01: qty 1

## 2012-11-01 MED ORDER — OXYCODONE-ACETAMINOPHEN 5-325 MG PO TABS: 1.0000 | ORAL_TABLET | ORAL | Status: DC | PRN

## 2012-11-01 MED ORDER — POLYETHYLENE GLYCOL 3350 17 G PO PACK: 17.0000 g | PACK | Freq: Every day | ORAL | Status: DC | PRN

## 2012-11-01 MED ORDER — ASPIRIN 81 MG PO CHEW: 81.0000 mg | CHEWABLE_TABLET | Freq: Every day | ORAL | Status: DC

## 2012-11-01 NOTE — Progress Notes (Signed)
Patient ID: Alyssa Rangel, female   DOB: [birth year], 47 y.o.   MRN: [id number] 1 Day Post-Op  Subjective: Patient desires an advanced diet, she has tolerated clear liquids since surgery. She has ambulated around her room only. She has not had flatus or BM. Her pain is controlled with medications.   Objective: Vital signs in last 24 hours: Temp:  [97.5 F (36.4 C)-98.8 F (37.1 C)] 98.8 F (37.1 C) (10/24 0157) Pulse Rate:  [57-88] 79 (10/24 0157) Resp:  [15-24] 18 (10/24 0157) BP: (93-167)/(47-85) 136/59 mmHg (10/24 0157) SpO2:  [88 %-100 %] 99 % (10/24 0157) Last BM Date: 10/30/12  Intake/Output from previous day: 10/23 0701 - 10/24 0700 In: 1424.2 [I.V.:1424.2] Out: 650 [Urine:650] Intake/Output this shift:    PE: Gen: Afebrile. Pleasant, NAD. CV: RRR Chest: CTAB. No wheeze or crackles.  Abd: soft. Appropriately tender. Mildly distended. Umbilicus dressing with old post-op drainage, otherwise dry and intact.  Incisions c/d/i. BS present, but hypoactive. No masses palpated.  Ext: No erythema, edema or tenderness.   Lab Results:   Recent Labs  10/31/12 0451  WBC 7.1  HGB 9.1*  HCT 28.2*  PLT 532*   BMET  Recent Labs  10/31/12 0451  NA 136  K 4.9  CL 104  CO2 21  GLUCOSE 86  BUN 3*  CREATININE 0.69  CALCIUM 8.7   PT/INR No results found for this basename: LABPROT, INR,  in the last 72 hours CMP     Component Value Date/Time   NA 136 10/31/2012 0451   K 4.9 10/31/2012 0451   CL 104 10/31/2012 0451   CO2 21 10/31/2012 0451   GLUCOSE 86 10/31/2012 0451   BUN 3* 10/31/2012 0451   CREATININE 0.69 10/31/2012 0451   CREATININE 0.68 07/17/2012 1126   CALCIUM 8.7 10/31/2012 0451   PROT 6.7 10/29/2012 0530   ALBUMIN 3.0* 10/29/2012 0530   AST 9 10/29/2012 0530   ALT 10 10/29/2012 0530   ALKPHOS 71 10/29/2012 0530   BILITOT 0.6 10/29/2012 0530   GFRNONAA >90 10/31/2012 0451   GFRAA >90 10/31/2012 0451   Lipase     Component Value Date/Time   LIPASE 23 10/27/2012 2301    Studies/Results: Dg Cholangiogram Operative  10/31/2012   CLINICAL DATA:  Cholecystectomy.  EXAM: INTRAOPERATIVE CHOLANGIOGRAM  TECHNIQUE: Cholangiographic images from the C-arm fluoroscopic device were submitted for interpretation post-operatively. Please see the procedural report for the amount of contrast and the fluoroscopy time utilized.  COMPARISON:  Ultrasound 11/05/2012  FINDINGS: Contrast fills the common bile duct, common hepatic duct and central intrahepatic bile ducts. There are no large filling defects or stones. Contrast drains into the duodenum.  IMPRESSION: Patent biliary system without obstructing lesion or stones.   Electronically Signed   By: Richarda Overlie M.D.   On: 10/31/2012 15:38    Anti-infectives: Anti-infectives   Start     Dose/Rate Route Frequency Ordered Stop   10/31/12 1400  piperacillin-tazobactam (ZOSYN) IVPB 3.375 g     3.375 g 100 mL/hr over 30 Minutes Intravenous To Surgery 10/31/12 1352 10/31/12 1401   10/28/12 0800  piperacillin-tazobactam (ZOSYN) IVPB 3.375 g  Status:  Discontinued     3.375 g 12.5 mL/hr over 240 Minutes Intravenous 3 times per day 10/28/12 0750 10/31/12 1837      Assessment:  Alyssa Rangel is 47 y.o. AAF admitted to surgical team for acute cholecystitis.  1. Acute cholecystitis with cholelithiasis 2. 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 S/p  right ovarian cystectomy and endometrial ablation 01/2012 4. Constipation- resolved with bowel regimen. 5. Leukocytosis - resolved with Zosyn 6. Normocytic Anemia  Plan:  1. Acute cholecystitis with cholelithiasis: IV zosyn started 10/20. S/p Laparoscopic cholecystectomy patient tolerated well.  2. 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  4. Normocytic anemia: Stopped Xarelto on admission. Anemia likely d/t to blood loss in recent hip surgery. No signs of blood loss d/t GI issues. Have  restarted Xarelto today for her s/p right total hip  (10/1).  5. Diet: Clear liquids this morning, advance diet as tolerated 6. Encouraged patient to ambulate.  7. Possible discharge this evening if patient able to ambulate, tolerates advance diet and pain is controlled.    LOS: 5 days    Felix Pacini 11/01/2012, 9:27 AM Pager: [phone]

## 2012-11-01 NOTE — Progress Notes (Signed)
Discharge instructions reviewed with pt and prescription given.  Pt verbalized understanding and had no questions.  Hector Shade [place]

## 2012-11-01 NOTE — Progress Notes (Signed)
Patient interviewed and examined, agree with PA note above.  Mariella Saa MD, FACS  11/01/2012 6:29 PM

## 2012-11-01 NOTE — Progress Notes (Signed)
Pt was to d/c xarelto on 10/21, and start Asprin for 4 weeks at that point.  We will send her home on Asprin only.

## 2012-11-01 NOTE — Progress Notes (Signed)
Physical Therapy Treatment Patient Details Name: Alyssa Rangel MRN: [id number] DOB: 11/20/1965 Today's Date: 11/01/2012 Time: 5621-3086 PT Time Calculation (min): 44 min  PT Assessment / Plan / Recommendation  History of Present Illness This is a 47 year old female who presents with the sudden onset of epigastric abdominal pain hurting through to her back. She also developed multiple episodes of nausea and vomiting. This began yesterday. The pain was sharp and constant as well as moderate to severe. She has no previous attacks. She is less than 3 weeks out from hip replacement surgery. She is taking blood thinning medication which was to stop tomorrow. She is otherwise without complaints.  She is now s/p lap chole for gall bladder removal on 10/31/12   PT Comments   Pt moving well post-op and is ready physically for discharge home.  We practiced stairs, gait with RW, tub transfers (to see if she had the balance and strength to do it standing) and standing leg exercises.  Pt did very well and would benefit from wrapping up with her HHPT (already in place PTA with [name]) and staring OP PT soon.    Follow Up Recommendations  Home health PT;Supervision for mobility/OOB (resume with Alyssa Rangel)     Does the patient have the potential to tolerate intense rehabilitation    NA  Barriers to Discharge   None      Equipment Recommendations  None recommended by PT    Recommendations for Other Services   None  Frequency Min 3X/week   Progress towards PT Goals Progress towards PT goals: Progressing toward goals  Plan Current plan remains appropriate;Frequency needs to be updated    Precautions / Restrictions Precautions Precautions: Fall;Posterior Hip Restrictions Other Position/Activity Restrictions: WBAT   Pertinent Vitals/Pain See vitals flow sheet.    Mobility  Bed Mobility Bed Mobility: Not assessed (pt seated EOB) Transfers Sit to Stand: 6: Modified independent (Device/Increase  time);With upper extremity assist;From bed;From chair/3-in-1 Stand to Sit: 6: Modified independent (Device/Increase time);Without upper extremity assist;To bed;To chair/3-in-1 Details for Transfer Assistance: reliance on hands for transitions Ambulation/Gait Ambulation/Gait Assistance: 5: Supervision Ambulation Distance (Feet): 500 Feet Assistive device: Rolling walker Ambulation/Gait Assistance Details: supervision for cues and safety.  Cues for upright posture and good heel to toe gait pattern Gait Pattern: Step-through pattern;Decreased stance time - right;Antalgic Stairs: Yes Stairs Assistance: 4: Min guard Stairs Assistance Details (indicate cue type and reason): min guard assist for safety Stair Management Technique: One rail Right;Sideways;Step to pattern Number of Stairs: 2    Exercises General Exercises - Lower Extremity Hip ABduction/ADduction: AROM;Right;10 reps;Standing Hip Flexion/Marching: AROM;Right;10 reps;Standing Heel Raises: AROM;Both;10 reps;Standing Mini-Sqauts: AROM;Both;10 reps;Standing    PT Goals (current goals can now be found in the care plan section) Acute Rehab PT Goals Patient Stated Goal: to be independent  Visit Information  Last PT Received On: 11/01/12 Assistance Needed: +1 History of Present Illness: This is a 47 year old female who presents with the sudden onset of epigastric abdominal pain hurting through to her back. She also developed multiple episodes of nausea and vomiting. This began yesterday. The pain was sharp and constant as well as moderate to severe. She has no previous attacks. She is less than 3 weeks out from hip replacement surgery. She is taking blood thinning medication which was to stop tomorrow. She is otherwise without complaints.  She is now s/p lap chole for gall bladder removal on 10/31/12    Subjective Data  Subjective: pt reports she is only a little sore  in her abdomen from surgery Patient Stated Goal: to be independent    Cognition  Cognition Arousal/Alertness: Awake/alert Behavior During Therapy: WFL for tasks assessed/performed Overall Cognitive Status: Within Functional Limits for tasks assessed       End of Session PT - End of Session Activity Tolerance: Patient tolerated treatment well Patient left: in bed;with call bell/phone within reach (seated EOB) Nurse Communication: Patient requests pain meds    Alyssa Rangel B. Alyssa Rangel, PT, DPT [phone]   11/01/2012, 4:53 PM

## 2012-11-02 ENCOUNTER — Encounter (HOSPITAL_COMMUNITY): Payer: Self-pay | Admitting: General Surgery

## 2012-11-02 DIAGNOSIS — K810 Acute cholecystitis: Secondary | ICD-10-CM | POA: Diagnosis present

## 2012-11-02 NOTE — Discharge Summary (Addendum)
Physician Discharge Summary  Patient ID: Alyssa Rangel MRN: [id number] DOB/AGE: November 04, 1965 47 y.o.  Admit date: 10/27/2012 Discharge date: 11/04/2012  Admission Diagnoses:  1. Acute cholecystitis with cholelithiasis  2. 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 S/p right ovarian cystectomy and endometrial ablation 01/2012  4. Constipation- resolved with bowel regimen.  5. Leukocytosis - resolved with Zosyn  6. Normocytic Anemia  Discharge Diagnoses:  Same Active Problems:   Cholecystitis, acute   PROCEDURES: LAPAROSCOPIC CHOLECYSTECTOMY WITH INTRAOPERATIVE CHOLANGIOGRAM , 10/31/12 Dr. Johna Sheriff.   Hospital Course: patient is a 47 year old female status post recent total hip replacement on anticoagulation who presents with acute epigastric and right upper quadrant abdominal pain, elevated white count and ultrasound showing cholelithiasis and evidence of acute cholecystitis. She was admitted and placed on antibiotics while her anticoagulant was allowed to reverse.  She ws taken to the OR on 10/31/12, and did well.  Her diet was advanced and she was ready for discharge the following afternoon. She can resume her PT for her hip replacement.  Her Xarelto was stopped and she was placed on Asprin post op, following the normal course of her post op hip surgery.  PT was also resumed per Orthopedic protocol.  Condition on d/c:  Improved   Disposition: 06-Home-Health Care Svc     Medication List    STOP taking these medications       oxyCODONE 5 MG immediate release tablet  Commonly known as:  Oxy IR/ROXICODONE     rivaroxaban 10 MG Tabs tablet  Commonly known as:  XARELTO      TAKE these medications       aspirin 81 MG chewable tablet  Chew 1 tablet (81 mg total) by mouth daily.     methocarbamol 500 MG tablet  Commonly known as:  ROBAXIN  Take 1 tablet (500 mg total) by mouth every 6 (six) hours as needed.     oxyCODONE-acetaminophen 5-325 MG per tablet   Commonly known as:  PERCOCET/ROXICET  Take 1-2 tablets by mouth every 4 (four) hours as needed.     polyethylene glycol packet  Commonly known as:  MIRALAX / GLYCOLAX  Take 17 g by mouth daily as needed (use as needed for constipation.  No BM in 48 hours.).     rosuvastatin 20 MG tablet  Commonly known as:  CRESTOR  Take 20 mg by mouth every morning.     traMADol 50 MG tablet  Commonly known as:  ULTRAM  Take 1-2 tablets (50-100 mg total) by mouth every 6 (six) hours as needed (mild pain).       Follow-up Information   Follow up with HOXWORTH,BENJAMIN T, MD. Schedule an appointment as soon as possible for a visit in 2 weeks.   Specialty:  General Surgery   Contact information:   [address] [place] [address] [phone]       Signed: Sherrie George 11/04/2012, 2:57 PM

## 2012-11-05 ENCOUNTER — Encounter (INDEPENDENT_AMBULATORY_CARE_PROVIDER_SITE_OTHER): Payer: 59

## 2012-11-06 ENCOUNTER — Emergency Department (HOSPITAL_COMMUNITY): Payer: 59

## 2012-11-06 ENCOUNTER — Inpatient Hospital Stay (HOSPITAL_COMMUNITY): Payer: 59

## 2012-11-06 ENCOUNTER — Inpatient Hospital Stay (HOSPITAL_COMMUNITY)
Admission: EM | Admit: 2012-11-06 | Discharge: 2012-11-08 | DRG: 761 | Disposition: A | Discharge status: Home / Self Care | Payer: 59 | Attending: General Surgery | Admitting: General Surgery

## 2012-11-06 ENCOUNTER — Encounter (HOSPITAL_COMMUNITY): Payer: Self-pay | Admitting: Emergency Medicine

## 2012-11-06 ENCOUNTER — Telehealth (INDEPENDENT_AMBULATORY_CARE_PROVIDER_SITE_OTHER): Payer: Self-pay

## 2012-11-06 DIAGNOSIS — M069 Rheumatoid arthritis, unspecified: Secondary | ICD-10-CM | POA: Diagnosis present

## 2012-11-06 DIAGNOSIS — G8918 Other acute postprocedural pain: Secondary | ICD-10-CM

## 2012-11-06 DIAGNOSIS — N9489 Other specified conditions associated with female genital organs and menstrual cycle: Principal | ICD-10-CM | POA: Diagnosis present

## 2012-11-06 DIAGNOSIS — Z79899 Other long term (current) drug therapy: Secondary | ICD-10-CM

## 2012-11-06 DIAGNOSIS — R109 Unspecified abdominal pain: Secondary | ICD-10-CM

## 2012-11-06 DIAGNOSIS — Z9089 Acquired absence of other organs: Secondary | ICD-10-CM

## 2012-11-06 DIAGNOSIS — I10 Essential (primary) hypertension: Secondary | ICD-10-CM | POA: Diagnosis present

## 2012-11-06 DIAGNOSIS — Z87891 Personal history of nicotine dependence: Secondary | ICD-10-CM

## 2012-11-06 DIAGNOSIS — E7800 Pure hypercholesterolemia, unspecified: Secondary | ICD-10-CM | POA: Diagnosis present

## 2012-11-06 LAB — URINALYSIS, ROUTINE W REFLEX MICROSCOPIC
Bilirubin Urine: NEGATIVE
Glucose, UA: NEGATIVE mg/dL
Ketones, ur: 40 mg/dL — AB
Nitrite: NEGATIVE
Protein, ur: 100 mg/dL — AB
pH: 8.5 — ABNORMAL HIGH (ref 5.0–8.0)

## 2012-11-06 LAB — CBC WITH DIFFERENTIAL/PLATELET
Basophils Absolute: 0 10*3/uL (ref 0.0–0.1)
Basophils Relative: 0 % (ref 0–1)
HCT: 31.3 % — ABNORMAL LOW (ref 36.0–46.0)
Lymphocytes Relative: 16 % (ref 12–46)
MCHC: 32.6 g/dL (ref 30.0–36.0)
MCV: 80.3 fL (ref 78.0–100.0)
Monocytes Absolute: 0.4 10*3/uL (ref 0.1–1.0)
Neutro Abs: 7.6 10*3/uL (ref 1.7–7.7)
Neutrophils Relative %: 80 % — ABNORMAL HIGH (ref 43–77)
Platelets: 533 10*3/uL — ABNORMAL HIGH (ref 150–400)
RDW: 14.7 % (ref 11.5–15.5)
WBC: 9.5 10*3/uL (ref 4.0–10.5)

## 2012-11-06 LAB — COMPREHENSIVE METABOLIC PANEL
ALT: 12 U/L (ref 0–35)
AST: 11 U/L (ref 0–37)
Albumin: 3.3 g/dL — ABNORMAL LOW (ref 3.5–5.2)
Alkaline Phosphatase: 71 U/L (ref 39–117)
CO2: 20 mEq/L (ref 19–32)
Chloride: 100 mEq/L (ref 96–112)
Creatinine, Ser: 0.59 mg/dL (ref 0.50–1.10)
GFR calc non Af Amer: >90 mL/min (ref >90)
Potassium: 3.9 mEq/L (ref 3.5–5.1)
Sodium: 133 mEq/L — ABNORMAL LOW (ref 135–145)
Total Bilirubin: 0.4 mg/dL (ref 0.3–1.2)

## 2012-11-06 LAB — LIPASE, BLOOD: Lipase: 30 U/L (ref 11–59)

## 2012-11-06 MED ORDER — TECHNETIUM TC 99M MEBROFENIN IV KIT
5.5000 | PACK | Freq: Once | INTRAVENOUS | Status: AC | PRN
  Administered 2012-11-06: 6 via INTRAVENOUS

## 2012-11-06 MED ORDER — ONDANSETRON HCL 4 MG/2ML IJ SOLN
4.0000 mg | Freq: Once | INTRAMUSCULAR | Status: AC
  Administered 2012-11-06: 4 mg via INTRAVENOUS
  Filled 2012-11-06: qty 2

## 2012-11-06 MED ORDER — SODIUM CHLORIDE 0.9 % IV SOLN
1.0000 g | Freq: Every day | INTRAVENOUS | Status: DC
  Administered 2012-11-06 – 2012-11-07 (×2): 1 g via INTRAVENOUS
  Filled 2012-11-06 (×3): qty 1

## 2012-11-06 MED ORDER — HEPARIN SODIUM (PORCINE) 5000 UNIT/ML IJ SOLN
5000.0000 [IU] | Freq: Three times a day (TID) | INTRAMUSCULAR | Status: DC
  Administered 2012-11-06 – 2012-11-08 (×6): 5000 [IU] via SUBCUTANEOUS
  Filled 2012-11-06 (×8): qty 1

## 2012-11-06 MED ORDER — IOHEXOL 300 MG/ML  SOLN
100.0000 mL | Freq: Once | INTRAMUSCULAR | Status: AC | PRN
  Administered 2012-11-06: 100 mL via INTRAVENOUS

## 2012-11-06 MED ORDER — ONDANSETRON HCL 4 MG/2ML IJ SOLN
4.0000 mg | Freq: Four times a day (QID) | INTRAMUSCULAR | Status: DC | PRN
  Administered 2012-11-07 – 2012-11-08 (×4): 4 mg via INTRAVENOUS
  Filled 2012-11-06 (×4): qty 2

## 2012-11-06 MED ORDER — KCL IN DEXTROSE-NACL 20-5-0.9 MEQ/L-%-% IV SOLN
INTRAVENOUS | Status: DC
  Administered 2012-11-06: via INTRAVENOUS
  Administered 2012-11-07: 980 mL via INTRAVENOUS
  Administered 2012-11-08: 09:00:00 via INTRAVENOUS
  Filled 2012-11-06 (×6): qty 1000

## 2012-11-06 MED ORDER — HYDROMORPHONE HCL PF 1 MG/ML IJ SOLN
1.0000 mg | Freq: Once | INTRAMUSCULAR | Status: AC
  Administered 2012-11-06: 1 mg via INTRAVENOUS
  Filled 2012-11-06: qty 1

## 2012-11-06 MED ORDER — PANTOPRAZOLE SODIUM 40 MG IV SOLR
40.0000 mg | Freq: Every day | INTRAVENOUS | Status: DC
  Administered 2012-11-06 – 2012-11-07 (×2): 40 mg via INTRAVENOUS
  Filled 2012-11-06 (×3): qty 40

## 2012-11-06 MED ORDER — MORPHINE SULFATE 2 MG/ML IJ SOLN
2.0000 mg | INTRAMUSCULAR | Status: DC | PRN
  Administered 2012-11-06 – 2012-11-07 (×3): 2 mg via INTRAVENOUS
  Administered 2012-11-07: 4 mg via INTRAVENOUS
  Administered 2012-11-07 (×3): 2 mg via INTRAVENOUS
  Administered 2012-11-07: 4 mg via INTRAVENOUS
  Administered 2012-11-08 (×5): 2 mg via INTRAVENOUS
  Filled 2012-11-06 (×4): qty 1
  Filled 2012-11-06 (×2): qty 2
  Filled 2012-11-06 (×7): qty 1

## 2012-11-06 NOTE — ED Notes (Signed)
Bed: RESA Expected date:  Expected time:  Means of arrival:  Comments: ems- gallbladder out on thurs ,LOC, hypotensive

## 2012-11-06 NOTE — ED Notes (Signed)
Bedside report received from previous RN, Brittney 

## 2012-11-06 NOTE — ED Provider Notes (Signed)
Medical screening examination/treatment/procedure(s) were performed by non-physician practitioner and as supervising physician I was immediately available for consultation/collaboration.  EKG Interpretation     Ventricular Rate:    PR Interval:    QRS Duration:   QT Interval:    QTC Calculation:   R Axis:     Text Interpretation:                 Richardean Canal, MD 11/06/12 2304

## 2012-11-06 NOTE — ED Notes (Signed)
Surgical incision measuring  3 cm  Just above navel with steri-strips in place. No drainage or bleeding noted.

## 2012-11-06 NOTE — ED Notes (Signed)
PA made aware that the Pt is asking for more pain medication.  Pt was sleeping when this RN went in to update her.

## 2012-11-06 NOTE — ED Provider Notes (Signed)
CSN: [id number]     Arrival date & time 11/06/12  1349 History   None    Chief Complaint  Patient presents with  . Abdominal Pain  . Nausea   (Consider location/radiation/quality/duration/timing/severity/associated sxs/prior Treatment) HPI Comments: Patient is a 47 year old female who is s/p cholecystectomy 6 days ago who presents with sudden onset of abdominal pain with associated nausea and vomiting that started prior to arrival. The pain is located in her epigastrium and does not radiate. The pain is described as aching and severe. The pain started sudden and progressively worsened since the onset. No alleviating/aggravating factors. The patient has tried nothing for symptoms without relief. Associated symptoms include nausea and vomiting. Patient denies fever, headache, diarrhea, chest pain, SOB, dysuria, constipation, abnormal vaginal bleeding/discharge. Patient reports normal bowel movements and last normal yesterday. Patient called the office prior to arrival and was instructed to come to the ED for further evaluation.     Patient is a 47 y.o. female presenting with abdominal pain.  Abdominal Pain Associated symptoms: nausea and vomiting     Past Medical History  Diagnosis Date  . Hip pain     pt states no cartilage in right hip very painful  . Hearing loss   . Routine gynecological examination     Dr. Osborn Coho, [facility]  . Hypertension     due to diet pills in the past, never on meds; normotensive as of 7/14  . Hypercholesterolemia     prior medication therapy  . Rheumatoid arthritis(714.0)     patient reported history  . 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 Polyarthralgia   . Anemia   . History of body piercing     tongue ring  . Cholecystitis, acute 11/02/2012   Past Surgical History  Procedure Laterality Date  . Laparoscopy  02/06/2012    Procedure: LAPAROSCOPY OPERATIVE;  Surgeon: Purcell Nails, MD;   Location: [facility];  Service: Gynecology;  Laterality: N/A;  . Dilitation & currettage/hystroscopy with novasure ablation  02/06/2012    Procedure: DILATATION & CURETTAGE/HYSTEROSCOPY WITH NOVASURE ABLATION;  Surgeon: Purcell Nails, MD;  Location: [facility];  Service: Gynecology;  Laterality: N/A;   novasure  . Cystoscopy  02/06/2012    Procedure: CYSTOSCOPY;  Surgeon: Purcell Nails, MD;  Location: [facility];  Service: Gynecology;  Laterality: N/A;  . Ovarian cyst removal  02/06/2012    Procedure: OVARIAN CYSTECTOMY;  Surgeon: Purcell Nails, MD;  Location: [facility];  Service: Gynecology;  Laterality: Bilateral;  . Bilateral salpingectomy  02/06/2012    Procedure: BILATERAL SALPINGECTOMY;  Surgeon: Purcell Nails, MD;  Location: [facility];  Service: Gynecology;  Laterality: Bilateral;  partial bilateral salpingectomy  . Total hip arthroplasty Right 10/09/2012    Procedure: RIGHT TOTAL HIP ARTHROPLASTY;  Surgeon: Loanne Drilling, MD;  Location: [facility];  Service: Orthopedics;  Laterality: Right;  . Cholecystectomy N/A 10/31/2012    Procedure: LAPAROSCOPIC CHOLECYSTECTOMY WITH INTRAOPERATIVE CHOLANGIOGRAM;  Surgeon: Mariella Saa, MD;  Location: [facility];  Service: General;  Laterality: N/A;   Family History  Problem Relation Age of Onset  . Heart disease Maternal Grandmother   . Diabetes Maternal Grandmother   . Hypertension Maternal Grandmother   . Arthritis Maternal Grandmother   . Diabetes Mother     insulin dependent  . Hypertension Mother   . Other Father     unknown  . Multiple sclerosis Sister   .  Cancer Neg Hx   . Stroke Neg Hx    History  Substance Use Topics  . Smoking status: Former Smoker -- 1.00 packs/day for 25 years    Types: Cigarettes    Quit date: 10/02/2011  . Smokeless tobacco: Never Used  . Alcohol Use: 0.6 oz/week    1 Glasses of wine per week     Comment: occ.   OB History   Grav Para Term Preterm Abortions TAB SAB Ect Mult Living   0              Review of  Systems  Gastrointestinal: Positive for nausea, vomiting and abdominal pain.  All other systems reviewed and are negative.    Allergies  Review of patient's allergies indicates no known allergies.  Home Medications   Current Outpatient Rx  Name  Route  Sig  Dispense  Refill  . aspirin 81 MG chewable tablet   Oral   Chew 1 tablet (81 mg total) by mouth daily.         . methocarbamol (ROBAXIN) 500 MG tablet   Oral   Take 1 tablet (500 mg total) by mouth every 6 (six) hours as needed.   80 tablet   0   . oxyCODONE-acetaminophen (PERCOCET/ROXICET) 5-325 MG per tablet   Oral   Take 1-2 tablets by mouth every 4 (four) hours as needed.   40 tablet   0   . polyethylene glycol (MIRALAX / GLYCOLAX) packet   Oral   Take 17 g by mouth daily as needed (use as needed for constipation.  No BM in 48 hours.).   14 each   0   . rosuvastatin (CRESTOR) 20 MG tablet   Oral   Take 20 mg by mouth every morning.         . traMADol (ULTRAM) 50 MG tablet   Oral   Take 1-2 tablets (50-100 mg total) by mouth every 6 (six) hours as needed (mild pain).   60 tablet   0    BP 129/48  Pulse 81  Temp(Src) 99.2 F (37.3 C) (Oral)  Resp 24  SpO2 100%  LMP 01/27/2012 Physical Exam  Nursing note and vitals reviewed. Constitutional: She is oriented to person, place, and time. She appears well-developed and well-nourished. No distress.  HENT:  Head: Normocephalic and atraumatic.  Eyes: Conjunctivae and EOM are normal. Pupils are equal, round, and reactive to light.  Neck: Normal range of motion.  Cardiovascular: Normal rate and regular rhythm.  Exam reveals no gallop and no friction rub.   No murmur heard. Pulmonary/Chest: Effort normal and breath sounds normal. She has no wheezes. She has no rales. She exhibits no tenderness.  Abdominal: Soft. She exhibits no distension. There is tenderness. There is no rebound and no guarding.  Epigastric and midabdominal tenderness to  palpation.Centrally located, healing, 3.5cm surgical scar in upper central abdomen that does not appear infected. No peritoneal signs.   Musculoskeletal: Normal range of motion.  Neurological: She is alert and oriented to person, place, and time. Coordination normal.  Speech is goal-oriented. Moves limbs without ataxia.   Skin: Skin is warm and dry.  Psychiatric: She has a normal mood and affect. Her behavior is normal.    ED Course  Procedures (including critical care time) Labs Review Labs Reviewed  CBC WITH DIFFERENTIAL - Abnormal; Notable for the following:    Hemoglobin 10.2 (*)    HCT 31.3 (*)    Platelets 533 (*)  Neutrophils Relative % 80 (*)    All other components within normal limits  COMPREHENSIVE METABOLIC PANEL - Abnormal; Notable for the following:    Sodium 133 (*)    Glucose, Bld 130 (*)    Albumin 3.3 (*)    All other components within normal limits  URINALYSIS, ROUTINE W REFLEX MICROSCOPIC - Abnormal; Notable for the following:    APPearance CLOUDY (*)    pH 8.5 (*)    Hgb urine dipstick MODERATE (*)    Ketones, ur 40 (*)    Protein, ur 100 (*)    Leukocytes, UA MODERATE (*)    All other components within normal limits  URINE MICROSCOPIC-ADD ON - Abnormal; Notable for the following:    Squamous Epithelial / LPF MANY (*)    All other components within normal limits  LIPASE, BLOOD  POCT PREGNANCY, URINE   Imaging Review Ct Abdomen Pelvis W Contrast  11/06/2012   CLINICAL DATA:  Postcholecystectomy 6 days ago. Abdominal pain enable region with nausea and vomiting. Episode of unresponsiveness with trouble of blood pressure.  EXAM: CT ABDOMEN AND PELVIS WITH CONTRAST  TECHNIQUE: Multidetector CT imaging of the abdomen and pelvis was performed using the standard protocol following bolus administration of intravenous contrast.  CONTRAST:  OMNIPAQUE IOHEXOL 300 MG/ML  SOLN  COMPARISON:  10/28/2012 CT abdomen / pelvis and pelvic / abdominal sonogram.   FINDINGS: Post cholecystectomy. The small amount of fluid in the gallbladder fossa region may represent expected amount of postoperative fluid given the early postoperative state. If postop complications such as bile leak is of high clinical concern then further investigation with nuclear medicine exam may be considered.  Slight haziness of portal sites without complication noted. No bowel containing hernia.  Complex left adnexal mass containing cystic, solid and fatty components. Increase amount of surrounding fluid. This may reflect changes of cyst rupture or possibly fluid from the operative procedure (and less likely bile leak). This left adnexal process will need to be evaluated with MR given the CT and ultrasound appearance.  Uterine fibroid.  No discrete extra luminal bowel inflammatory process or free intraperitoneal air.  Cardiomegaly. Minimal basal atelectasis.  No abdominal aortic aneurysm.  Mild fatty infiltration of the liver. Minimal intrahepatic biliary duct prominence felt to be normal in a patient post cholecystectomy.  No worrisome splenic, pancreatic, adrenal or renal lesion.  Degenerative changes lower thoracic and lumbar spine  IMPRESSION: Post cholecystectomy. The small amount of fluid in the gallbladder fossa region may represent expected amount of postoperative fluid given the early postoperative state. If postop complications such as bile leak is of high clinical concern then further investigation with nuclear medicine exam may be considered.  Slight haziness of portal sites without complication noted. No bowel containing hernia.  Complex left adnexal mass containing cystic, solid and fatty components. Increase amount of surrounding fluid. This may reflect changes of cyst rupture or possibly fluid from the operative procedure (and less likely bile leak). This left adnexal process will need to be evaluated with MR given the CT and ultrasound appearance.  Uterine fibroid.  No discrete extra  luminal bowel inflammatory process or free intraperitoneal air.  Cardiomegaly.  Mild fatty infiltration of the liver. Minimal intrahepatic biliary duct prominence felt to be normal in a patient post cholecystectomy.   Electronically Signed   By: Bridgett Larsson M.D.   On: 11/06/2012 16:53    EKG Interpretation   None       MDM  No  diagnosis found.  3:40 PM Labs and urinalysis unremarkable. I spoke with Tresa Endo, General Surgery PA who advised to order a CT abdomen pelvis with contrast for the patient .   6:51 PM CT scan shows fluids in the gallbladder fossa. This is concerning for a bile leak since the [patient is 6 days post op. I spoke with Dr. Johna Sheriff who recommends a HIDA scan and will admit the patient.    Emilia Beck, PA-C 11/06/12 [id number]

## 2012-11-06 NOTE — ED Notes (Signed)
Pt had apenic episode with sat's dropped to 79% on Room air after dilaudid administration. Sternal rubs given to pt and told to take deep breaths. Pt 100% room air.

## 2012-11-06 NOTE — ED Notes (Signed)
Spoke with radiology. They are going to come transport the pt for the Hepatobiliary Function Test, then transport the pt to her room on the floor.

## 2012-11-06 NOTE — ED Notes (Addendum)
Per [facility] pt from home had [facility]bladder removed [facility] on Thursday came home Friday. She is c/o of abdominal pain above the navel with nausea and vomiting. In the ambulance pt became unresponsive with a  Pressure of 85/54.NS Fluids given and 4 mg of zofran . Pt now Alert and oriented.

## 2012-11-06 NOTE — ED Notes (Signed)
Pt awake and talking to nephew at bedside.

## 2012-11-06 NOTE — Progress Notes (Signed)
Utilization Review completed.  Nate Perri RN CM  

## 2012-11-06 NOTE — ED Notes (Signed)
Attempted to call report, floor nurse attempting for bed reassignment for pt.

## 2012-11-06 NOTE — H&P (Signed)
Alyssa Rangel is an 47 y.o. female.   Chief Complaint: abdominal pain, nausea and vomiting post cholecystectomy HPI: patient is a 47 year old female 6 days following laparoscopic cholecystectomy. She presented several weeks after total hip replacement with acute severe abdominal pain and was found to have acute and chronic cholecystitis. Anticoagulation was allowed to reverse and she underwent an uneventful laparoscopic cholecystectomy with intraoperative cholangiogram 6 days ago with findings of severe chronic and moderate acute cholecystitis. She initially did very well and was discharged. She was doing well until this morning when she awoke feeling somewhat nauseated. Soon after that she developed the onset of constant fairly severe upper abdominal pain radiating to both costal margins. She remained nauseated with vomiting. No fever or chills. She called the office and was instructed to come to the emergency room for evaluation.  Past Medical History  Diagnosis Date  . Hip pain     pt states no cartilage in right hip very painful  . Hearing loss   . Routine gynecological examination     Dr. Osborn Coho, [facility]  . Hypertension     due to diet pills in the past, never on meds; normotensive as of 7/14  . Hypercholesterolemia     prior medication therapy  . Rheumatoid arthritis(714.0)     patient reported history  . 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 Polyarthralgia   . Anemia   . History of body piercing     tongue ring  . Cholecystitis, acute 11/02/2012    Past Surgical History  Procedure Laterality Date  . Laparoscopy  02/06/2012    Procedure: LAPAROSCOPY OPERATIVE;  Surgeon: Purcell Nails, MD;  Location: [facility];  Service: Gynecology;  Laterality: N/A;  . Dilitation & currettage/hystroscopy with novasure ablation  02/06/2012    Procedure: DILATATION & CURETTAGE/HYSTEROSCOPY WITH NOVASURE ABLATION;  Surgeon: Purcell Nails, MD;  Location: [facility];  Service: Gynecology;  Laterality: N/A;   novasure  . Cystoscopy  02/06/2012    Procedure: CYSTOSCOPY;  Surgeon: Purcell Nails, MD;  Location: [facility];  Service: Gynecology;  Laterality: N/A;  . Ovarian cyst removal  02/06/2012    Procedure: OVARIAN CYSTECTOMY;  Surgeon: Purcell Nails, MD;  Location: [facility];  Service: Gynecology;  Laterality: Bilateral;  . Bilateral salpingectomy  02/06/2012    Procedure: BILATERAL SALPINGECTOMY;  Surgeon: Purcell Nails, MD;  Location: [facility];  Service: Gynecology;  Laterality: Bilateral;  partial bilateral salpingectomy  . Total hip arthroplasty Right 10/09/2012    Procedure: RIGHT TOTAL HIP ARTHROPLASTY;  Surgeon: Loanne Drilling, MD;  Location: [facility];  Service: Orthopedics;  Laterality: Right;  . Cholecystectomy N/A 10/31/2012    Procedure: LAPAROSCOPIC CHOLECYSTECTOMY WITH INTRAOPERATIVE CHOLANGIOGRAM;  Surgeon: Mariella Saa, MD;  Location: [facility];  Service: General;  Laterality: N/A;    Family History  Problem Relation Age of Onset  . Heart disease Maternal Grandmother   . Diabetes Maternal Grandmother   . Hypertension Maternal Grandmother   . Arthritis Maternal Grandmother   . Diabetes Mother     insulin dependent  . Hypertension Mother   . Other Father     unknown  . Multiple sclerosis Sister   . Cancer Neg Hx   . Stroke Neg Hx    Social History:  reports that she quit smoking about 13 months ago. Her smoking use included Cigarettes. She has a 25 pack-year smoking history. She has  never used smokeless tobacco. She reports that she drinks about 0.6 ounces of alcohol per week. She reports that she does not use illicit drugs.  Allergies: No Known Allergies  No current facility-administered medications for this encounter.   Current Outpatient Prescriptions  Medication Sig Dispense Refill  . aspirin 81 MG chewable tablet Chew 1 tablet (81 mg total) by mouth daily.      . methocarbamol (ROBAXIN) 500 MG  tablet Take 1 tablet (500 mg total) by mouth every 6 (six) hours as needed.  80 tablet  0  . oxyCODONE-acetaminophen (PERCOCET/ROXICET) 5-325 MG per tablet Take 1-2 tablets by mouth every 4 (four) hours as needed for pain.      . polyethylene glycol (MIRALAX / GLYCOLAX) packet Take 17 g by mouth daily as needed (use as needed for constipation.  No BM in 48 hours.).  14 each  0  . rosuvastatin (CRESTOR) 20 MG tablet Take 20 mg by mouth every morning.      . traMADol (ULTRAM) 50 MG tablet Take 1-2 tablets (50-100 mg total) by mouth every 6 (six) hours as needed (mild pain).  60 tablet  0     Results for orders placed during the hospital encounter of 11/06/12 (from the past 48 hour(s))  URINALYSIS, ROUTINE W REFLEX MICROSCOPIC     Status: Abnormal   Collection Time    11/06/12  2:16 PM      Result Value Range   Color, Urine YELLOW  YELLOW   APPearance CLOUDY (*) CLEAR   Specific Gravity, Urine 1.025  1.005 - 1.030   pH 8.5 (*) 5.0 - 8.0   Glucose, UA NEGATIVE  NEGATIVE mg/dL   Hgb urine dipstick MODERATE (*) NEGATIVE   Bilirubin Urine NEGATIVE  NEGATIVE   Ketones, ur 40 (*) NEGATIVE mg/dL   Protein, ur 409 (*) NEGATIVE mg/dL   Urobilinogen, UA 0.2  0.0 - 1.0 mg/dL   Nitrite NEGATIVE  NEGATIVE   Leukocytes, UA MODERATE (*) NEGATIVE  URINE MICROSCOPIC-ADD ON     Status: Abnormal   Collection Time    11/06/12  2:16 PM      Result Value Range   Squamous Epithelial / LPF MANY (*) RARE   WBC, UA 3-6  <3 WBC/hpf   Bacteria, UA RARE  RARE   Urine-Other FEW YEAST    CBC WITH DIFFERENTIAL     Status: Abnormal   Collection Time    11/06/12  2:20 PM      Result Value Range   WBC 9.5  4.0 - 10.5 K/uL   RBC 3.90  3.87 - 5.11 MIL/uL   Hemoglobin 10.2 (*) 12.0 - 15.0 g/dL   HCT 81.1 (*) 91.4 - 78.2 %   MCV 80.3  78.0 - 100.0 fL   MCH 26.2  26.0 - 34.0 pg   MCHC 32.6  30.0 - 36.0 g/dL   RDW 95.6  21.3 - 08.6 %   Platelets 533 (*) 150 - 400 K/uL   Neutrophils Relative % 80 (*) 43 - 77 %    Neutro Abs 7.6  1.7 - 7.7 K/uL   Lymphocytes Relative 16  12 - 46 %   Lymphs Abs 1.5  0.7 - 4.0 K/uL   Monocytes Relative 4  3 - 12 %   Monocytes Absolute 0.4  0.1 - 1.0 K/uL   Eosinophils Relative 0  0 - 5 %   Eosinophils Absolute 0.0  0.0 - 0.7 K/uL   Basophils Relative 0  0 -  1 %   Basophils Absolute 0.0  0.0 - 0.1 K/uL  COMPREHENSIVE METABOLIC PANEL     Status: Abnormal   Collection Time    11/06/12  2:20 PM      Result Value Range   Sodium 133 (*) 135 - 145 mEq/L   Potassium 3.9  3.5 - 5.1 mEq/L   Chloride 100  96 - 112 mEq/L   CO2 20  19 - 32 mEq/L   Glucose, Bld 130 (*) 70 - 99 mg/dL   BUN 11  6 - 23 mg/dL   Creatinine, Ser 1.61  0.50 - 1.10 mg/dL   Calcium 9.4  8.4 - 09.6 mg/dL   Total Protein 7.6  6.0 - 8.3 g/dL   Albumin 3.3 (*) 3.5 - 5.2 g/dL   AST 11  0 - 37 U/L   ALT 12  0 - 35 U/L   Alkaline Phosphatase 71  39 - 117 U/L   Total Bilirubin 0.4  0.3 - 1.2 mg/dL   GFR calc non Af Amer >90  >90 mL/min   GFR calc Af Amer >90  >90 mL/min   Comment: (NOTE)     The eGFR has been calculated using the CKD EPI equation.     This calculation has not been validated in all clinical situations.     eGFR's persistently <90 mL/min signify possible Chronic Kidney     Disease.  LIPASE, BLOOD     Status: None   Collection Time    11/06/12  2:20 PM      Result Value Range   Lipase 30  11 - 59 U/L  POCT PREGNANCY, URINE     Status: None   Collection Time    11/06/12  2:25 PM      Result Value Range   Preg Test, Ur NEGATIVE  NEGATIVE   Comment:            THE SENSITIVITY OF THIS     METHODOLOGY IS >24 mIU/mL   Ct Abdomen Pelvis W Contrast  11/06/2012   CLINICAL DATA:  Postcholecystectomy 6 days ago. Abdominal pain enable region with nausea and vomiting. Episode of unresponsiveness with trouble of blood pressure.  EXAM: CT ABDOMEN AND PELVIS WITH CONTRAST  TECHNIQUE: Multidetector CT imaging of the abdomen and pelvis was performed using the standard protocol following bolus  administration of intravenous contrast.  CONTRAST:  OMNIPAQUE IOHEXOL 300 MG/ML  SOLN  COMPARISON:  10/28/2012 CT abdomen / pelvis and pelvic / abdominal sonogram.  FINDINGS: Post cholecystectomy. The small amount of fluid in the gallbladder fossa region may represent expected amount of postoperative fluid given the early postoperative state. If postop complications such as bile leak is of high clinical concern then further investigation with nuclear medicine exam may be considered.  Slight haziness of portal sites without complication noted. No bowel containing hernia.  Complex left adnexal mass containing cystic, solid and fatty components. Increase amount of surrounding fluid. This may reflect changes of cyst rupture or possibly fluid from the operative procedure (and less likely bile leak). This left adnexal process will need to be evaluated with MR given the CT and ultrasound appearance.  Uterine fibroid.  No discrete extra luminal bowel inflammatory process or free intraperitoneal air.  Cardiomegaly. Minimal basal atelectasis.  No abdominal aortic aneurysm.  Mild fatty infiltration of the liver. Minimal intrahepatic biliary duct prominence felt to be normal in a patient post cholecystectomy.  No worrisome splenic, pancreatic, adrenal or renal lesion.  Degenerative  changes lower thoracic and lumbar spine  IMPRESSION: Post cholecystectomy. The small amount of fluid in the gallbladder fossa region may represent expected amount of postoperative fluid given the early postoperative state. If postop complications such as bile leak is of high clinical concern then further investigation with nuclear medicine exam may be considered.  Slight haziness of portal sites without complication noted. No bowel containing hernia.  Complex left adnexal mass containing cystic, solid and fatty components. Increase amount of surrounding fluid. This may reflect changes of cyst rupture or possibly fluid from the operative  procedure (and less likely bile leak). This left adnexal process will need to be evaluated with MR given the CT and ultrasound appearance.  Uterine fibroid.  No discrete extra luminal bowel inflammatory process or free intraperitoneal air.  Cardiomegaly.  Mild fatty infiltration of the liver. Minimal intrahepatic biliary duct prominence felt to be normal in a patient post cholecystectomy.   Electronically Signed   By: Bridgett Larsson M.D.   On: 11/06/2012 16:53    Review of Systems  Constitutional: Negative for fever and chills.  Respiratory: Positive for shortness of breath. Negative for cough, hemoptysis and wheezing.   Cardiovascular: Negative.   Gastrointestinal: Positive for nausea, vomiting and abdominal pain.  Genitourinary: Negative.     Blood pressure 167/68, pulse 94, temperature 99.2 F (37.3 C), temperature source Oral, resp. rate 16, last menstrual period 01/27/2012, SpO2 100.00%. Physical Exam  General: Moderately obese African American female who appears uncomfortable Skin: Warm and dry without rash or infection HEENT: Sclerae nonicteric. Oropharynx clear. No masses. Lungs: Clear equal breath sounds. No wheezing or increased work of breathing. Cardiovascular:mild tachycardia. No murmurs. No JVD or edema Abdomen: Healing laparoscopic incisions without evidence of infection. There is moderate to marked upper abdominal tenderness with some guarding. No discernible masses or hernias. Extremities: No edema or deformity Neurologic: Alert and fully oriented. Affect normal. No gross motor deficits.  Assessment/Plan Acute upper abdominal pain 6 days following laparoscopic cholecystectomy. LFTs and other lab work are unremarkable. She has significant tenderness. CT scan shows a small amount of fluid beneath the liver which could be normal in the correct setting but this clinical picture is of concern for bile leak. We will go ahead and get an emergency HIDA  To rule in or rule out this  possibility. Patient will be admitted for symptom control and IV fluids and observation.  Concha Sudol T 11/06/2012, 6:55 PM

## 2012-11-06 NOTE — Telephone Encounter (Signed)
Pt called in stating she starting having uncontrollable pain with nausea and vomiting. Rated pain 10 out of 10. She is barely able to talk to me without making mumbling noises. She denied SOB but she sounded like she was ina great deal of distress and having a difficult time talking and breathing. She has no ride to get anywhere to be seen. I told her to call 911 for an ambulance to take her to the ER. She agreed to call for an ambulance.

## 2012-11-07 LAB — CBC
HCT: 28.1 % — ABNORMAL LOW (ref 36.0–46.0)
Hemoglobin: 9.1 g/dL — ABNORMAL LOW (ref 12.0–15.0)
MCV: 81 fL (ref 78.0–100.0)
RBC: 3.47 MIL/uL — ABNORMAL LOW (ref 3.87–5.11)
RDW: 14.8 % (ref 11.5–15.5)
WBC: 8.6 10*3/uL (ref 4.0–10.5)

## 2012-11-07 LAB — COMPREHENSIVE METABOLIC PANEL
Albumin: 3 g/dL — ABNORMAL LOW (ref 3.5–5.2)
Alkaline Phosphatase: 66 U/L (ref 39–117)
BUN: 9 mg/dL (ref 6–23)
Calcium: 9.1 mg/dL (ref 8.4–10.5)
GFR calc Af Amer: >90 mL/min (ref >90)
Glucose, Bld: 113 mg/dL — ABNORMAL HIGH (ref 70–99)
Potassium: 3.7 mEq/L (ref 3.5–5.1)
Total Bilirubin: 0.5 mg/dL (ref 0.3–1.2)
Total Protein: 6.9 g/dL (ref 6.0–8.3)

## 2012-11-07 LAB — URINE MICROSCOPIC-ADD ON

## 2012-11-07 NOTE — Progress Notes (Signed)
Patient ID: Alyssa Rangel, female   DOB: 07/16/1965, 47 y.o.   MRN: [id number]    Subjective: Improved from yesterday. Still some pain but definitely better. No vomiting, just mild nausea  Objective: Vital signs in last 24 hours: Temp:  [98.3 F (36.8 C)-99.2 F (37.3 C)] 98.3 F (36.8 C) (10/30 0600) Pulse Rate:  [77-94] 77 (10/30 0600) Resp:  [16-24] 20 (10/30 0600) BP: (111-167)/(48-98) 111/98 mmHg (10/30 0600) SpO2:  [100 %] 100 % (10/30 0600) Weight:  [220 lb (99.791 kg)] 220 lb (99.791 kg) (10/29 2259) Last BM Date: 11/05/12  Intake/Output from previous day: 10/29 0701 - 10/30 0700 In: 250 [P.O.:250] Out: -  Intake/Output this shift:    General appearance: alert, cooperative and no distress GI: mild epigastric and mid abdominal tenderness improved from yesterday.  Lab Results:   Recent Labs  11/06/12 1420 11/07/12 0351  WBC 9.5 8.6  HGB 10.2* 9.1*  HCT 31.3* 28.1*  PLT 533* 459*   BMET  Recent Labs  11/06/12 1420 11/07/12 0351  NA 133* 137  K 3.9 3.7  CL 100 104  CO2 20 23  GLUCOSE 130* 113*  BUN 11 9  CREATININE 0.59 0.65  CALCIUM 9.4 9.1     Studies/Results: Nm Hepatobiliary Liver Func  11/06/2012   CLINICAL DATA:  Recent cholecystectomy. Abdominal pain. Fluid in gallbladder fossa.  EXAM: NUCLEAR MEDICINE HEPATOBILIARY IMAGING  TECHNIQUE: Sequential images of the abdomen were obtained out to 60 minutes following intravenous administration of radiopharmaceutical.  COMPARISON:  None.  RADIOPHARMACEUTICALS:  5.63mCi Tc-52m Choletec  FINDINGS: There is prompt uptake of the radiopharmaceutical by the liver, with timely excretion into central bile ducts and small bowel. No evidence of leak on supine and erect imaging.  IMPRESSION: No evidence of bile leak.   Electronically Signed   By: Oley Balm M.D.   On: 11/06/2012 22:24   Ct Abdomen Pelvis W Contrast  11/06/2012   CLINICAL DATA:  Postcholecystectomy 6 days ago. Abdominal pain enable  region with nausea and vomiting. Episode of unresponsiveness with trouble of blood pressure.  EXAM: CT ABDOMEN AND PELVIS WITH CONTRAST  TECHNIQUE: Multidetector CT imaging of the abdomen and pelvis was performed using the standard protocol following bolus administration of intravenous contrast.  CONTRAST:  OMNIPAQUE IOHEXOL 300 MG/ML  SOLN  COMPARISON:  10/28/2012 CT abdomen / pelvis and pelvic / abdominal sonogram.  FINDINGS: Post cholecystectomy. The small amount of fluid in the gallbladder fossa region may represent expected amount of postoperative fluid given the early postoperative state. If postop complications such as bile leak is of high clinical concern then further investigation with nuclear medicine exam may be considered.  Slight haziness of portal sites without complication noted. No bowel containing hernia.  Complex left adnexal mass containing cystic, solid and fatty components. Increase amount of surrounding fluid. This may reflect changes of cyst rupture or possibly fluid from the operative procedure (and less likely bile leak). This left adnexal process will need to be evaluated with MR given the CT and ultrasound appearance.  Uterine fibroid.  No discrete extra luminal bowel inflammatory process or free intraperitoneal air.  Cardiomegaly. Minimal basal atelectasis.  No abdominal aortic aneurysm.  Mild fatty infiltration of the liver. Minimal intrahepatic biliary duct prominence felt to be normal in a patient post cholecystectomy.  No worrisome splenic, pancreatic, adrenal or renal lesion.  Degenerative changes lower thoracic and lumbar spine  IMPRESSION: Post cholecystectomy. The small amount of fluid in the gallbladder fossa region  may represent expected amount of postoperative fluid given the early postoperative state. If postop complications such as bile leak is of high clinical concern then further investigation with nuclear medicine exam may be considered.  Slight haziness of portal  sites without complication noted. No bowel containing hernia.  Complex left adnexal mass containing cystic, solid and fatty components. Increase amount of surrounding fluid. This may reflect changes of cyst rupture or possibly fluid from the operative procedure (and less likely bile leak). This left adnexal process will need to be evaluated with MR given the CT and ultrasound appearance.  Uterine fibroid.  No discrete extra luminal bowel inflammatory process or free intraperitoneal air.  Cardiomegaly.  Mild fatty infiltration of the liver. Minimal intrahepatic biliary duct prominence felt to be normal in a patient post cholecystectomy.   Electronically Signed   By: Bridgett Larsson M.D.   On: 11/06/2012 16:53    Anti-infectives: Anti-infectives   Start     Dose/Rate Route Frequency Ordered Stop   11/06/12 2300  ertapenem (INVANZ) 1 g in sodium chloride 0.9 % 50 mL IVPB     1 g 100 mL/hr over 30 Minutes Intravenous Daily at bedtime 11/06/12 2244        Assessment/Plan: Acute abdominal pain status post recent laparoscopic cholecystectomy with cholangiogram. Workup shows no evidence of bile leak and I did not see any other evidence of complication directly related to her cholecystectomy.  CT scanShows a complex left adnexal mass with fluid. In January she underwent bilateral salpingo-oophorectomy. This may represent a residual cyst with bleeding or postoperative process. I suspect bleeding from this is the source of her pain. She is improved today. Will offer diet and follow clinically. CBC tomorrow.    LOS: 1 day    Ringo Sherod T 11/07/2012

## 2012-11-07 NOTE — Progress Notes (Signed)
INITIAL NUTRITION ASSESSMENT  DOCUMENTATION CODES Per approved criteria  -Obesity Unspecified   INTERVENTION: - Diet advancement per MD - Educated pt on low fat diet r/t recent cholecystectomy - Will continue to monitor   NUTRITION DIAGNOSIS: Inadequate oral intake related to clear liquid diet as evidenced by diet order.   Goal: Advance diet as tolerated to low fat diet  Monitor:  Weights, labs, diet advancement, nausea, abdominal pain  Reason for Assessment: Nutrition risk   47 y.o. female  Admitting Dx: Abdominal pain, nausea, and vomiting post cholecystectomy  ASSESSMENT: Pt discussed during multidisciplinary rounds.   Pt from home, had recent gallbladder removal Thursday PTA at [facility]. Admitted with abdominal pain above the navel with nausea and vomiting, became unresponsive in the ambulance, fluids were given and pt became alert and oriented.   Met with pt who reports typically eating well at home with good appetite, eats 3 meals/day and denies any changes in her weight. Reports not eating yesterday due to nausea with multiple episodes of vomiting. Denies any vomiting today and only c/o of a little bit of nausea. Abdominal pain better.   Height: Ht Readings from Last 1 Encounters:  11/06/12 5' 4.5" (1.638 m)    Weight: Wt Readings from Last 1 Encounters:  11/06/12 220 lb (99.791 kg)    Ideal Body Weight: 125 lb   % Ideal Body Weight: 176%  Wt Readings from Last 10 Encounters:  11/06/12 220 lb (99.791 kg)  10/27/12 229 lb (103.874 kg)  10/27/12 229 lb (103.874 kg)  10/09/12 223 lb 4 oz (101.266 kg)  10/09/12 223 lb 4 oz (101.266 kg)  10/01/12 223 lb 4 oz (101.266 kg)  09/06/12 220 lb (99.791 kg)  07/17/12 223 lb (101.152 kg)  02/26/12 222 lb (100.699 kg)  02/07/12 224 lb (101.606 kg)    Usual Body Weight: 220-229 lb in the past few months  % Usual Body Weight: 100%  BMI:  Body mass index is 37.19 kg/(m^2). Class II obesity  Estimated  Nutritional Needs: Kcal: 1600-1800 Protein: 70-80g Fluid: 1.6-1.8L/day  Skin: Abdominal incision   Diet Order: Clear Liquid  EDUCATION NEEDS: -Education needs addressed - educated pt on low fat diet for recent cholecystectomy   Intake/Output Summary (Last 24 hours) at 11/07/12 0936 Last data filed at 11/07/12 0928  Gross per 24 hour  Intake    490 ml  Output     75 ml  Net    415 ml    Last BM: 10/28  Labs:   Recent Labs Lab 11/06/12 1420 11/07/12 0351  NA 133* 137  K 3.9 3.7  CL 100 104  CO2 20 23  BUN 11 9  CREATININE 0.59 0.65  CALCIUM 9.4 9.1  GLUCOSE 130* 113*    CBG (last 3)  No results found for this basename: GLUCAP,  in the last 72 hours  Scheduled Meds: . ertapenem (INVANZ) IV  1 g Intravenous QHS  . heparin  5,000 Units Subcutaneous Q8H  . pantoprazole (PROTONIX) IV  40 mg Intravenous QHS    Continuous Infusions: . dextrose 5 % and 0.9 % NaCl with KCl 20 mEq/L 700 mL (11/07/12 0906)    Past Medical History  Diagnosis Date  . Hip pain     pt states no cartilage in right hip very painful  . Hearing loss   . Routine gynecological examination     Dr. Osborn Coho, [facility]  . Hypertension     due to diet pills  in the past, never on meds; normotensive as of 7/14  . Hypercholesterolemia     prior medication therapy  . Rheumatoid arthritis(714.0)     patient reported history  . 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 Polyarthralgia   . Anemia   . History of body piercing     tongue ring  . Cholecystitis, acute 11/02/2012    Past Surgical History  Procedure Laterality Date  . Laparoscopy  02/06/2012    Procedure: LAPAROSCOPY OPERATIVE;  Surgeon: Purcell Nails, MD;  Location: [facility];  Service: Gynecology;  Laterality: N/A;  . Dilitation & currettage/hystroscopy with novasure ablation  02/06/2012    Procedure: DILATATION & CURETTAGE/HYSTEROSCOPY WITH NOVASURE ABLATION;  Surgeon: Purcell Nails, MD;  Location: [facility];  Service: Gynecology;  Laterality: N/A;   novasure  . Cystoscopy  02/06/2012    Procedure: CYSTOSCOPY;  Surgeon: Purcell Nails, MD;  Location: [facility];  Service: Gynecology;  Laterality: N/A;  . Ovarian cyst removal  02/06/2012    Procedure: OVARIAN CYSTECTOMY;  Surgeon: Purcell Nails, MD;  Location: [facility];  Service: Gynecology;  Laterality: Bilateral;  . Bilateral salpingectomy  02/06/2012    Procedure: BILATERAL SALPINGECTOMY;  Surgeon: Purcell Nails, MD;  Location: [facility];  Service: Gynecology;  Laterality: Bilateral;  partial bilateral salpingectomy  . Total hip arthroplasty Right 10/09/2012    Procedure: RIGHT TOTAL HIP ARTHROPLASTY;  Surgeon: Loanne Drilling, MD;  Location: [facility];  Service: Orthopedics;  Laterality: Right;  . Cholecystectomy N/A 10/31/2012    Procedure: LAPAROSCOPIC CHOLECYSTECTOMY WITH INTRAOPERATIVE CHOLANGIOGRAM;  Surgeon: Mariella Saa, MD;  Location: [facility];  Service: General;  Laterality: N/A;    Levon Hedger MS, RD, LDN [phone] Pager [phone] After Hours Pager

## 2012-11-08 LAB — CBC
HCT: 27.7 % — ABNORMAL LOW (ref 36.0–46.0)
Hemoglobin: 8.9 g/dL — ABNORMAL LOW (ref 12.0–15.0)
MCH: 26.3 pg (ref 26.0–34.0)
MCHC: 32.1 g/dL (ref 30.0–36.0)
MCV: 82 fL (ref 78.0–100.0)
RBC: 3.38 MIL/uL — ABNORMAL LOW (ref 3.87–5.11)

## 2012-11-08 NOTE — Discharge Summary (Signed)
Patient ID: Alyssa Rangel MRN: [id number] DOB/AGE: [birth year] 47 y.o.  Admit date: 11/06/2012 Discharge date: 11/08/2012  Procedures: none  Consults: None  Reason for Admission: patient is a 47 year old female 6 days following laparoscopic cholecystectomy. She presented several weeks after total hip replacement with acute severe abdominal pain and was found to have acute and chronic cholecystitis. Anticoagulation was allowed to reverse and she underwent an uneventful laparoscopic cholecystectomy with intraoperative cholangiogram 6 days ago with findings of severe chronic and moderate acute cholecystitis. She initially did very well and was discharged. She was doing well until this morning when she awoke feeling somewhat nauseated. Soon after that she developed the onset of constant fairly severe upper abdominal pain radiating to both costal margins. She remained nauseated with vomiting. No fever or chills. She called the office and was instructed to come to the emergency room for evaluation.  Admission Diagnoses:  1. Abdominal pain, s/p lap chole 2. Pelvic fluid with left adnexal cyst with solid and fat components  Hospital Course: The patient was admitted for pain control after post operative complication was ruled out by CT scan and HIDA scan.  Her pain was improved on HD 1.  She was given clear liquids, which she tolerated well.  Her pain was felt to be likely secondary to this left adnexal cyst/mass with fluid surrounding this concerning for possible rupture.  I discussed this with Dr. Su Hilt, her GYN who would like for her to follow up ASAP for further evaluate this.  The patient is stable on HD2 after tolerating full liquids for dc home.  Discharge Diagnoses:  1. Complex left adnexal cystic, solid mass, possible rupture 2. S/p lap chole with no evidence of a post op complication  Discharge Medications:   Medication List         aspirin 81 MG chewable tablet  Chew 1 tablet  (81 mg total) by mouth daily.     methocarbamol 500 MG tablet  Commonly known as:  ROBAXIN  Take 1 tablet (500 mg total) by mouth every 6 (six) hours as needed.     oxyCODONE-acetaminophen 5-325 MG per tablet  Commonly known as:  PERCOCET/ROXICET  Take 1-2 tablets by mouth every 4 (four) hours as needed for pain.     polyethylene glycol packet  Commonly known as:  MIRALAX / GLYCOLAX  Take 17 g by mouth daily as needed (use as needed for constipation.  No BM in 48 hours.).     rosuvastatin 20 MG tablet  Commonly known as:  CRESTOR  Take 20 mg by mouth every morning.     traMADol 50 MG tablet  Commonly known as:  ULTRAM  Take 1-2 tablets (50-100 mg total) by mouth every 6 (six) hours as needed (mild pain).        Discharge Instructions:     Follow-up Information   Follow up with [facility] On 11/26/2012.   Contact information:   [address]   [place] [address] [phone]       Follow up with Purcell Nails, MD.   Specialty:  Obstetrics and Gynecology   Contact information:   [address] [place] [address] [phone]       Signed: Letha Cape 11/08/2012, 9:04 AM

## 2012-11-11 NOTE — Discharge Summary (Signed)
She was discharged before I could evaluate her

## 2012-11-26 ENCOUNTER — Encounter (INDEPENDENT_AMBULATORY_CARE_PROVIDER_SITE_OTHER): Payer: 59

## 2013-07-27 ENCOUNTER — Emergency Department (HOSPITAL_COMMUNITY)
Admission: EM | Admit: 2013-07-27 | Discharge: 2013-07-27 | Disposition: A | Discharge status: Home / Self Care | Payer: 59 | Attending: Emergency Medicine | Admitting: Emergency Medicine

## 2013-07-27 ENCOUNTER — Encounter (HOSPITAL_COMMUNITY): Payer: Self-pay | Admitting: Emergency Medicine

## 2013-07-27 DIAGNOSIS — Z8639 Personal history of other endocrine, nutritional and metabolic disease: Secondary | ICD-10-CM | POA: Insufficient documentation

## 2013-07-27 DIAGNOSIS — Z862 Personal history of diseases of the blood and blood-forming organs and certain disorders involving the immune mechanism: Secondary | ICD-10-CM | POA: Insufficient documentation

## 2013-07-27 DIAGNOSIS — M069 Rheumatoid arthritis, unspecified: Secondary | ICD-10-CM | POA: Insufficient documentation

## 2013-07-27 DIAGNOSIS — Z8669 Personal history of other diseases of the nervous system and sense organs: Secondary | ICD-10-CM | POA: Insufficient documentation

## 2013-07-27 DIAGNOSIS — I10 Essential (primary) hypertension: Secondary | ICD-10-CM | POA: Insufficient documentation

## 2013-07-27 DIAGNOSIS — H109 Unspecified conjunctivitis: Secondary | ICD-10-CM | POA: Insufficient documentation

## 2013-07-27 DIAGNOSIS — Z87891 Personal history of nicotine dependence: Secondary | ICD-10-CM | POA: Insufficient documentation

## 2013-07-27 DIAGNOSIS — B353 Tinea pedis: Secondary | ICD-10-CM | POA: Insufficient documentation

## 2013-07-27 MED ORDER — TOBRAMYCIN 0.3 % OP SOLN
2.0000 [drp] | OPHTHALMIC | Status: DC
  Administered 2013-07-27: 2 [drp] via OPHTHALMIC
  Filled 2013-07-27: qty 5

## 2013-07-27 MED ORDER — TERBINAFINE HCL 1 % EX CREA
1.0000 | TOPICAL_CREAM | Freq: Two times a day (BID) | CUTANEOUS | Status: DC
Start: 2013-07-27 — End: 2014-10-30

## 2013-07-27 NOTE — ED Notes (Signed)
Pt is c/o pain and swelling to the right side of her face  Pt states sxs started 4 days ago  Pt states day one and two she only had pain to the right side of her face and day 3 she started having swelling that has continued to today  Pt states she thinks something bit her on her left foot  Pt states it is itiching really bad

## 2013-07-27 NOTE — Discharge Instructions (Signed)
You were diagnosed with conjunctivitis of the right eye. Please use the antibiotic eyedrops, tobramycin, as instructed by placing 2 drops in the right eye or 4 hours. Followup with an ophthalmology eye specialist for continued evaluation and treatment. You were also diagnosed with athlete's feet. Use Lamisil antifungal cream for the next 4-6 weeks to help with your skin condition.   Conjunctivitis Conjunctivitis is commonly called "pink eye." Conjunctivitis can be caused by bacterial or viral infection, allergies, or injuries. There is usually redness of the lining of the eye, itching, discomfort, and sometimes discharge. There may be deposits of matter along the eyelids. A viral infection usually causes a watery discharge, while a bacterial infection causes a yellowish, thick discharge. Pink eye is very contagious and spreads by direct contact. You may be given antibiotic eyedrops as part of your treatment. Before using your eye medicine, remove all drainage from the eye by washing gently with warm water and cotton balls. Continue to use the medication until you have awakened 2 mornings in a row without discharge from the eye. Do not rub your eye. This increases the irritation and helps spread infection. Use separate towels from other household members. Wash your hands with soap and water before and after touching your eyes. Use cold compresses to reduce pain and sunglasses to relieve irritation from light. Do not wear contact lenses or wear eye makeup until the infection is gone. SEEK MEDICAL CARE IF:   Your symptoms are not better after 3 days of treatment.  You have increased pain or trouble seeing.  The outer eyelids become very red or swollen. Document Released: 02/03/2004 Document Revised: 03/20/2011 Document Reviewed: 12/26/2004 [facility] Patient Information 2015 [place], [place]. This information is not intended to replace advice given to you by your health care provider. Make sure you discuss  any questions you have with your health care provider.    Athlete's Foot  Athlete's foot is a skin infection caused by a fungus. Athlete's foot is often seen between or under the toes. It can also be seen on the bottom of the foot. Athlete's foot can spread to other people by sharing towels or shower stalls. HOME CARE  Only take medicines as told by your doctor. Do not use steroid creams.  Wash your feet daily. Dry your feet well, especially between the toes.  Change your socks every day. Wear cotton or wool socks.  Change your socks 2 to 3 times a day in hot weather.  Wear sandals or canvas tennis shoes with good airflow.  If you have blisters, soak your feet in a solution as told by your doctor. Do this for 20 to 30 minutes, 2 times a day. Dry your feet well after you soak them.  Do not share towels.  Wear sandals when you use shared locker rooms or showers. GET HELP RIGHT AWAY IF:   You have a fever.  Your foot is puffy (swollen), sore, warm, or red.  You are not getting better after 7 days of treatment.  You still have athlete's foot after 30 days.  You have problems caused by your medicine. MAKE SURE YOU:   Understand these instructions.  Will watch your condition.  Will get help right away if you are not doing well or get worse. Document Released: 06/14/2007 Document Revised: 03/20/2011 Document Reviewed: 10/14/2010 [facility] Patient Information 2015 [place], [place]. This information is not intended to replace advice given to you by your health care provider. Make sure you discuss any questions you  have with your health care provider. ° °

## 2013-07-27 NOTE — ED Provider Notes (Signed)
CSN: [id number]     Arrival date & time 07/27/13  2000 History  This chart was scribed for non-physician provider Hazel Sams, PA-C, working with Virgel Manifold, MD by Irene Pap, ED Scribe. This patient was seen in room WTR6/WTR6 and patient care was started at 9:36 PM.   Chief Complaint  Patient presents with  . Facial Pain  . Facial Swelling   The history is provided by the patient. No language interpreter was used.   HPI Comments: Alyssa Rangel is a 48 y.o. female who presents to the Emergency Department complaining of facial pain and swelling onset 4 days ago. She reports that 4 days ago, the right eye was hurting, and the swelling and pain worsened by the next day. She reports associated visual disturbances, stating that it felt like there was something in her eye, so she attempted to flush it with water to no relief. She reports that she also took Advil to no relief. She states that she was at a cookout the day before symptoms appeared but is unaware of whether or not she came in contact with anyone who had similar symptoms. She reports itching and pain to the left heel. She reports that the spot has grown in size. She reports that she has tried anti-biotic creams to no relief. She denies nausea, vomiting, fever, chills, diaphoresis, itchy eyes, eye drainage, sick contacts, dental pain, or ear pain.   Past Medical History  Diagnosis Date  . Hip pain     pt states no cartilage in right hip very painful  . Hearing loss   . Routine gynecological examination     Dr. Everett Graff, [facility] ([facility]  . Hypertension     due to diet pills in the past, never on meds; normotensive as of 7/14  . Hypercholesterolemia     prior medication therapy  . Rheumatoid arthritis(714.0)     patient reported history  . 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 Polyarthralgia   . Anemia   . History of body piercing     tongue ring  . Cholecystitis, acute  11/02/2012   Past Surgical History  Procedure Laterality Date  . Laparoscopy  02/06/2012    Procedure: LAPAROSCOPY OPERATIVE;  Surgeon: Delice Lesch, MD;  Location: [facility];  Service: Gynecology;  Laterality: N/A;  . Dilitation & currettage/hystroscopy with novasure ablation  02/06/2012    Procedure: DILATATION & CURETTAGE/HYSTEROSCOPY WITH NOVASURE ABLATION;  Surgeon: Delice Lesch, MD;  Location: [facility];  Service: Gynecology;  Laterality: N/A;   novasure  . Cystoscopy  02/06/2012    Procedure: CYSTOSCOPY;  Surgeon: Delice Lesch, MD;  Location: [facility];  Service: Gynecology;  Laterality: N/A;  . Ovarian cyst removal  02/06/2012    Procedure: OVARIAN CYSTECTOMY;  Surgeon: Delice Lesch, MD;  Location: [facility];  Service: Gynecology;  Laterality: Bilateral;  . Bilateral salpingectomy  02/06/2012    Procedure: BILATERAL SALPINGECTOMY;  Surgeon: Delice Lesch, MD;  Location: [facility];  Service: Gynecology;  Laterality: Bilateral;  partial bilateral salpingectomy  . Total hip arthroplasty Right 10/09/2012    Procedure: RIGHT TOTAL HIP ARTHROPLASTY;  Surgeon: Gearlean Alf, MD;  Location: [facility];  Service: Orthopedics;  Laterality: Right;  . Cholecystectomy N/A 10/31/2012    Procedure: LAPAROSCOPIC CHOLECYSTECTOMY WITH INTRAOPERATIVE CHOLANGIOGRAM;  Surgeon: Edward Jolly, MD;  Location: [facility];  Service: General;  Laterality: N/A;   Family History  Problem  Relation Age of Onset  . Heart disease Maternal Grandmother   . Diabetes Maternal Grandmother   . Hypertension Maternal Grandmother   . Arthritis Maternal Grandmother   . Diabetes Mother     insulin dependent  . Hypertension Mother   . Other Father     unknown  . Multiple sclerosis Sister   . Cancer Neg Hx   . Stroke Neg Hx    History  Substance Use Topics  . Smoking status: Former Smoker -- 1.00 packs/day for 25 years    Types: Cigarettes    Quit date: 10/02/2011  . Smokeless tobacco: Never Used  . Alcohol Use: 0.6  oz/week    1 Glasses of wine per week     Comment: occ.   OB History   Grav Para Term Preterm Abortions TAB SAB Ect Mult Living   0              Review of Systems  Constitutional: Negative for fever, chills and diaphoresis.  Eyes: Positive for discharge, redness and itching. Negative for photophobia and visual disturbance.  All other systems reviewed and are negative.     Allergies  Review of patient's allergies indicates no known allergies.  Home Medications   Prior to Admission medications   Medication Sig Start Date End Date Taking? Authorizing Provider  naproxen (NAPROSYN) 250 MG tablet Take 500 mg by mouth 2 (two) times daily as needed (pain).   Yes Historical Provider, MD   BP 143/117  Pulse 105  Temp(Src) 98.9 F (37.2 C) (Oral)  Resp 20  SpO2 97% Physical Exam  Nursing note and vitals reviewed. Constitutional: She is oriented to person, place, and time. She appears well-developed and well-nourished. No distress.  HENT:  Head: Normocephalic.  Mouth/Throat: Oropharynx is clear and moist.  No significant facial swelling. Normal dentition without pain.  Eyes: EOM are normal. Pupils are equal, round, and reactive to light. Right eye exhibits discharge.  There is erythema and mild swelling of the right upper lower eyelids primarily the lateral aspect with slight conjunctival discharge.  Neck: Normal range of motion. Neck supple.  Cardiovascular: Normal rate and regular rhythm.   Pulmonary/Chest: Effort normal and breath sounds normal. No respiratory distress.  Abdominal: Soft.  Lymphadenopathy:    She has no cervical adenopathy.  Neurological: She is alert and oriented to person, place, and time.  Skin: Skin is warm and dry.  There is dry and cracking skin to bilateral feet on the plantar surfaces and in between the web spacing. There is a circular area that is well demarcated to the medial aspect of the left foot and ankle. There is a slight erythematous border  along a moccasin pattern  Psychiatric: She has a normal mood and affect. Her behavior is normal.    ED Course  Procedures   DIAGNOSTIC STUDIES: Oxygen Saturation is 97% on room air, normal by my interpretation.    COORDINATION OF CARE: 9:42 PM-patient seen and evaluated. Patient well appearing no acute distress. Patient with redness, swelling and slight discharge the right conjunctiva and upper and lower eyelids. No other facial swelling. Patient also with dry itching and cracking skin of the feet bilaterally including the web spacing. Consistent with athletes feet. I discussed treatment plan with patient and she agrees. Ophthalmology referral also provided.  MDM   Final diagnoses:  Conjunctivitis of right eye  Tinea pedis of both feet    I personally performed the services described in this documentation, which was scribed  in my presence. The recorded information has been reviewed and is accurate.     Martie Lee, PA-C 07/27/13 2210

## 2013-07-31 NOTE — ED Provider Notes (Signed)
Medical screening examination/treatment/procedure(s) were performed by non-physician practitioner and as supervising physician I was immediately available for consultation/collaboration.   EKG Interpretation None       Virgel Manifold, MD 07/31/13 1321

## 2013-08-18 ENCOUNTER — Telehealth: Payer: Self-pay | Admitting: Medical

## 2013-08-18 NOTE — Telephone Encounter (Signed)
Pt called and stated that she had knee surgery and is requesting a parking placard. I have those available up front. Please advise and I will bring you a form. Call pt at [phone] when ready.

## 2013-08-18 NOTE — Telephone Encounter (Signed)
I haven't seen since she has had surgery.  So I would request she try having the orthopedic surgeon complete the card for now as they better no her current capacity and whether she needs one or not.  Do we have a copy of the last one I did?

## 2013-08-19 NOTE — Telephone Encounter (Signed)
Advised pt of Shane's comments

## 2014-10-29 ENCOUNTER — Inpatient Hospital Stay (HOSPITAL_COMMUNITY)
Admission: EM | Admit: 2014-10-29 | Discharge: 2014-10-30 | DRG: 194 | Disposition: A | Discharge status: Home / Self Care | Payer: Self-pay | Attending: Family Medicine | Admitting: Family Medicine

## 2014-10-29 ENCOUNTER — Emergency Department (INDEPENDENT_AMBULATORY_CARE_PROVIDER_SITE_OTHER): Payer: Self-pay

## 2014-10-29 ENCOUNTER — Encounter (HOSPITAL_COMMUNITY): Payer: Self-pay | Admitting: *Deleted

## 2014-10-29 ENCOUNTER — Emergency Department (INDEPENDENT_AMBULATORY_CARE_PROVIDER_SITE_OTHER)
Admission: EM | Admit: 2014-10-29 | Discharge: 2014-10-29 | Payer: Self-pay | Source: Home / Self Care | Attending: Family Medicine | Admitting: Family Medicine

## 2014-10-29 ENCOUNTER — Encounter (HOSPITAL_COMMUNITY): Payer: Self-pay | Admitting: Neurology

## 2014-10-29 DIAGNOSIS — E7800 Pure hypercholesterolemia, unspecified: Secondary | ICD-10-CM | POA: Diagnosis present

## 2014-10-29 DIAGNOSIS — E785 Hyperlipidemia, unspecified: Secondary | ICD-10-CM | POA: Diagnosis present

## 2014-10-29 DIAGNOSIS — E119 Type 2 diabetes mellitus without complications: Secondary | ICD-10-CM | POA: Diagnosis present

## 2014-10-29 DIAGNOSIS — J189 Pneumonia, unspecified organism: Principal | ICD-10-CM | POA: Diagnosis present

## 2014-10-29 DIAGNOSIS — H9190 Unspecified hearing loss, unspecified ear: Secondary | ICD-10-CM | POA: Diagnosis present

## 2014-10-29 DIAGNOSIS — M25559 Pain in unspecified hip: Secondary | ICD-10-CM | POA: Diagnosis present

## 2014-10-29 DIAGNOSIS — L409 Psoriasis, unspecified: Secondary | ICD-10-CM | POA: Diagnosis present

## 2014-10-29 DIAGNOSIS — E669 Obesity, unspecified: Secondary | ICD-10-CM | POA: Insufficient documentation

## 2014-10-29 DIAGNOSIS — L209 Atopic dermatitis, unspecified: Secondary | ICD-10-CM | POA: Diagnosis present

## 2014-10-29 DIAGNOSIS — M1610 Unilateral primary osteoarthritis, unspecified hip: Secondary | ICD-10-CM | POA: Diagnosis present

## 2014-10-29 DIAGNOSIS — R062 Wheezing: Secondary | ICD-10-CM

## 2014-10-29 DIAGNOSIS — I10 Essential (primary) hypertension: Secondary | ICD-10-CM | POA: Insufficient documentation

## 2014-10-29 DIAGNOSIS — R0902 Hypoxemia: Secondary | ICD-10-CM | POA: Diagnosis present

## 2014-10-29 DIAGNOSIS — Z79899 Other long term (current) drug therapy: Secondary | ICD-10-CM

## 2014-10-29 DIAGNOSIS — Y95 Nosocomial condition: Secondary | ICD-10-CM | POA: Diagnosis present

## 2014-10-29 DIAGNOSIS — L4050 Arthropathic psoriasis, unspecified: Secondary | ICD-10-CM | POA: Diagnosis present

## 2014-10-29 DIAGNOSIS — M069 Rheumatoid arthritis, unspecified: Secondary | ICD-10-CM | POA: Diagnosis present

## 2014-10-29 DIAGNOSIS — M160 Bilateral primary osteoarthritis of hip: Secondary | ICD-10-CM | POA: Insufficient documentation

## 2014-10-29 DIAGNOSIS — Z6841 Body Mass Index (BMI) 40.0 and over, adult: Secondary | ICD-10-CM

## 2014-10-29 DIAGNOSIS — Z87891 Personal history of nicotine dependence: Secondary | ICD-10-CM

## 2014-10-29 LAB — BASIC METABOLIC PANEL
ANION GAP: 9 (ref 5–15)
BUN: 8 mg/dL (ref 6–20)
CHLORIDE: 107 mmol/L (ref 101–111)
CO2: 22 mmol/L (ref 22–32)
Calcium: 9 mg/dL (ref 8.9–10.3)
Creatinine, Ser: 0.78 mg/dL (ref 0.44–1.00)
GFR calc Af Amer: >60 mL/min (ref >60)
GLUCOSE: 109 mg/dL — AB (ref 65–99)
POTASSIUM: 3.7 mmol/L (ref 3.5–5.1)
Sodium: 138 mmol/L (ref 135–145)

## 2014-10-29 LAB — CBC WITH DIFFERENTIAL/PLATELET
BASOS ABS: 0 10*3/uL (ref 0.0–0.1)
Basophils Relative: 0 %
Eosinophils Absolute: 0.2 10*3/uL (ref 0.0–0.7)
Eosinophils Relative: 2 %
HCT: 38.8 % (ref 36.0–46.0)
Hemoglobin: 12.3 g/dL (ref 12.0–15.0)
LYMPHS ABS: 3.2 10*3/uL (ref 0.7–4.0)
LYMPHS PCT: 34 %
MCH: 26.5 pg (ref 26.0–34.0)
MCHC: 31.7 g/dL (ref 30.0–36.0)
MCV: 83.6 fL (ref 78.0–100.0)
Monocytes Absolute: 0.7 10*3/uL (ref 0.1–1.0)
Monocytes Relative: 7 %
NEUTROS PCT: 57 %
Neutro Abs: 5.3 10*3/uL (ref 1.7–7.7)
Platelets: 333 10*3/uL (ref 150–400)
RBC: 4.64 MIL/uL (ref 3.87–5.11)
RDW: 14 % (ref 11.5–15.5)
WBC: 9.3 10*3/uL (ref 4.0–10.5)

## 2014-10-29 LAB — I-STAT CG4 LACTIC ACID, ED: Lactic Acid, Venous: 1.23 mmol/L (ref 0.5–2.0)

## 2014-10-29 MED ORDER — ENOXAPARIN SODIUM 40 MG/0.4ML ~~LOC~~ SOLN
40.0000 mg | Freq: Every day | SUBCUTANEOUS | Status: DC
  Administered 2014-10-29: 40 mg via SUBCUTANEOUS
  Filled 2014-10-29: qty 0.4

## 2014-10-29 MED ORDER — ACETAMINOPHEN 325 MG PO TABS
650.0000 mg | ORAL_TABLET | Freq: Four times a day (QID) | ORAL | Status: DC | PRN
  Administered 2014-10-29: 650 mg via ORAL
  Filled 2014-10-29: qty 2

## 2014-10-29 MED ORDER — IPRATROPIUM-ALBUTEROL 0.5-2.5 (3) MG/3ML IN SOLN
3.0000 mL | Freq: Once | RESPIRATORY_TRACT | Status: AC
  Administered 2014-10-29: 3 mL via RESPIRATORY_TRACT
  Filled 2014-10-29: qty 3

## 2014-10-29 MED ORDER — DEXAMETHASONE SODIUM PHOSPHATE 10 MG/ML IJ SOLN
10.0000 mg | Freq: Once | INTRAMUSCULAR | Status: AC
  Administered 2014-10-29: 10 mg via INTRAMUSCULAR
  Filled 2014-10-29: qty 1

## 2014-10-29 MED ORDER — PIPERACILLIN-TAZOBACTAM 3.375 G IVPB
3.3750 g | Freq: Three times a day (TID) | INTRAVENOUS | Status: DC
  Administered 2014-10-30 (×2): 3.375 g via INTRAVENOUS
  Filled 2014-10-29 (×3): qty 50

## 2014-10-29 MED ORDER — CEFTRIAXONE SODIUM 1 G IJ SOLR
1.0000 g | Freq: Once | INTRAMUSCULAR | Status: DC
  Filled 2014-10-29: qty 10

## 2014-10-29 MED ORDER — ALBUTEROL SULFATE HFA 108 (90 BASE) MCG/ACT IN AERS: 1.0000 | INHALATION_SPRAY | Freq: Four times a day (QID) | RESPIRATORY_TRACT | Status: DC | PRN

## 2014-10-29 MED ORDER — ALBUTEROL SULFATE (2.5 MG/3ML) 0.083% IN NEBU
5.0000 mg | INHALATION_SOLUTION | Freq: Once | RESPIRATORY_TRACT | Status: AC
  Administered 2014-10-29: 5 mg via RESPIRATORY_TRACT

## 2014-10-29 MED ORDER — PIPERACILLIN-TAZOBACTAM 3.375 G IVPB 30 MIN
3.3750 g | Freq: Once | INTRAVENOUS | Status: AC
  Administered 2014-10-29: 3.375 g via INTRAVENOUS
  Filled 2014-10-29: qty 50

## 2014-10-29 MED ORDER — ALBUTEROL SULFATE (2.5 MG/3ML) 0.083% IN NEBU: 2.5000 mg | INHALATION_SOLUTION | RESPIRATORY_TRACT | Status: DC | PRN

## 2014-10-29 MED ORDER — VANCOMYCIN HCL IN DEXTROSE 1-5 GM/200ML-% IV SOLN
1000.0000 mg | Freq: Two times a day (BID) | INTRAVENOUS | Status: DC
  Administered 2014-10-30: 1000 mg via INTRAVENOUS
  Filled 2014-10-29 (×2): qty 200

## 2014-10-29 MED ORDER — IPRATROPIUM-ALBUTEROL 0.5-2.5 (3) MG/3ML IN SOLN
3.0000 mL | Freq: Three times a day (TID) | RESPIRATORY_TRACT | Status: DC
  Administered 2014-10-30 (×2): 3 mL via RESPIRATORY_TRACT
  Filled 2014-10-29 (×2): qty 3

## 2014-10-29 MED ORDER — ALBUTEROL SULFATE (2.5 MG/3ML) 0.083% IN NEBU
INHALATION_SOLUTION | RESPIRATORY_TRACT | Status: AC
  Filled 2014-10-29: qty 6

## 2014-10-29 MED ORDER — TRIAMCINOLONE ACETONIDE 0.5 % EX OINT
TOPICAL_OINTMENT | Freq: Two times a day (BID) | CUTANEOUS | Status: DC
  Administered 2014-10-29 – 2014-10-30 (×2): via TOPICAL
  Filled 2014-10-29: qty 15

## 2014-10-29 MED ORDER — IPRATROPIUM-ALBUTEROL 0.5-2.5 (3) MG/3ML IN SOLN
3.0000 mL | RESPIRATORY_TRACT | Status: DC
  Administered 2014-10-29: 3 mL via RESPIRATORY_TRACT
  Filled 2014-10-29: qty 3

## 2014-10-29 MED ORDER — DIPHENHYDRAMINE-ZINC ACETATE 2-0.1 % EX CREA
TOPICAL_CREAM | Freq: Two times a day (BID) | CUTANEOUS | Status: DC | PRN
  Filled 2014-10-29: qty 28

## 2014-10-29 MED ORDER — IPRATROPIUM BROMIDE 0.02 % IN SOLN
0.5000 mg | Freq: Once | RESPIRATORY_TRACT | Status: AC
  Administered 2014-10-29: 0.5 mg via RESPIRATORY_TRACT

## 2014-10-29 MED ORDER — SODIUM CHLORIDE 0.9 % IV SOLN
2500.0000 mg | Freq: Once | INTRAVENOUS | Status: AC
Start: 2014-10-29 — End: 2014-10-30
  Administered 2014-10-29: 2500 mg via INTRAVENOUS
  Filled 2014-10-29: qty 2500

## 2014-10-29 MED ORDER — AZITHROMYCIN 250 MG PO TABS: 250.0000 mg | ORAL_TABLET | Freq: Every day | ORAL | Status: DC

## 2014-10-29 MED ORDER — IPRATROPIUM BROMIDE 0.02 % IN SOLN
RESPIRATORY_TRACT | Status: AC
  Filled 2014-10-29: qty 2.5

## 2014-10-29 NOTE — H&P (Signed)
[facility] Admission History and Physical Service Pager: [phone]  Patient name: Alyssa Rangel Medical record number: [id number] Date of birth: 01-08-1966 Age: 49 y.o. Gender: female  Primary Care Provider: Crisoforo Oxford, PA-C Consultants: none Code Status: Full (confirmed on admission)  Chief Complaint: Dyspnea, Wheezing, Hypoxia  Assessment and Plan: Alyssa Rangel is a 49 y.o. 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 PMH is significant for RA vs OA (s/p R-total hip replace 2014), HTN, Obesity, Pre-DM, Former smoker 30 yr  # HCAP, bilateral infiltrates, with bronchospasm On admit with URI symptoms with productive cough, subjective chills but afebrile, also with wheezing and dyspnea. Pneumonia on CXR with bilateral streaking infiltrates, considered HCAP (d/t exposure, works as CNA in SNF), no known prior asthma / COPD, but does have significant prior smoking hx >30 years (former smoker), notable wheezing on admit since improved with nebulizer therapy, likely bronchospasm with URI/PNA vs possible underlying COPD. No active chest pain, no cardiac history. No history of DVT/PE, no evidence of DVT on exam. Well's (score 0). Also less likely main etiology with infectious symptoms currently but consider underlying infiltrative lung disease in setting of possible RA. - Admit to inpatient, med-surg, PNA-admission order set - vitals per protocol, cont pulse ox - currently stable on RA, O2 sat >97% at rest - Start antibiotics for HCAP coverage with Vancomycin IV and Zosyn IV (per pharmacy), anticipate transition to PO with likely levaquin in 24-48 hours pending improvement - Duonebs scheduled q 4 hr overnight - Albuterol nebs q 2 hr PRN - incentive spiro - blood cultures collected in ED x 2 (10/20>>) - legionella / strep  pneumo antigens / HIV (per PNA order set) - If significant worsening or not improving consider repeat CXR vs CT  # HTN BP stable on admit - Not on any anti-HTN meds - Monitor  # Rheumatoid Arthritis, OA, s/p R-THR Stable on admit without significant pain. Followed by Orthopedics. No regular pain regimen. - Tylenol PRN  # Pre-Diabetes, Morbid Obesity, HLD Last A1c 6.0 (07/2012), may have had later A1c at PCP office (not aware of values). BMI >43.5. Last lipid panel 2014. Not on statin therapy. - Check A1c in AM - Recommend outpatient weight loss, routine follow-up lipids  # ECZEMA / PSORIASIS Consistent with inflammed atopic dermatitis patches vs plaques on left lower extremity, mostly with itching, patient has significantly worsened by scratching. No prior diagnosis or therapy. Most likely eczema with itching, considered psoriasis as may have underlying psoriatic arthritis. - Start Triamcinolone 0.5% ointment BID, would complete at least 2 week course - Will need to start daily moisturizer - Benadryl topical cream PRN itch  FEN/GI: SLIV, Heart Healthy diet Prophylaxis: Lovenox SQ  Disposition: Admit to inpatient for CAP with hypoxia on ambulation, started on IV antibiotics, IV steroids, nebulizer therapy, O2, anticipate clinical improvement on antibiotics over next 1-3 days  History of Present Illness:  Alyssa Rangel is a 49 y.o. female presenting with dyspnea, wheezing, productive cough.  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 Describes symptoms worsening for the past 2 days with congestion, productive cough (green sputum) much worse at night compared to day, associated chest tightness, headache. Also admits to some chills and sweats but no measured fever when checked. She denies any significant prior  history of asthma, COPD, or other pulmonary disease. Admits to former smoker (about 30 year  smoking history, quit in 2010). Family history of asthma. Never been on albuterol or other inhalers. Admits to occasional lower ext edema but none acutely. Denies any history of heart disease or prior MI. Denies any h/o OSA or apnea events.  She initially presented to Urgent Care earlier today with these symptoms worsening for past 2 days, and sent for a chest x-ray showed infiltrates concerning for CAP, sent to ED for further evaluation.  In ED, she received Albuterol nebulizer x 1 and Duonebs x 2, also Decadron 10mg  IM x 1 due to presenting with wheezing and dyspnea. Given CXR findings with bilateral streaky infiltrates, patient was going to be started on CAP therapy and potential discharge home, however she was found to be hypoxic in ED after ambulation, she was 93% on RA at rest and then de-sat to mid 80s% on ambulation associated heavy breathing and DOE. Received CTX 1g IV x 1 in ED.  Prior history with total Right hip replacement in 2014 (Rheumatoid and Osteoarthritis), was only taking NSAIDs. Otherwise only significant history with obesity, reported borderline DM. Currently not taking any prescription meds, previously on Lasix for some peripheral edema (no longer on this).  Review Of Systems: Per HPI with the following additions: Denies nausea, vomiting, diarrhea, constipation, abdominal pain.  Otherwise 12 point review of systems was performed and was unremarkable.  Patient Active Problem List   Diagnosis Date Noted  . Cholecystitis, acute 11/02/2012  . Hyponatremia 10/10/2012  . OA (osteoarthritis) of hip 10/09/2012  . Menorrhagia 10/30/2011  . 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 Hypercholesterolemia   . Rheumatoid arthritis(714.0)    Past Medical History: Past Medical History  Diagnosis Date  . Hip pain     pt states no cartilage in right hip very painful  . Hearing loss   . Routine gynecological examination      Dr. Everett Graff, [facility]  . Hypertension     due to diet pills in the past, never on meds; normotensive as of 7/14  . Hypercholesterolemia     prior medication therapy  . Rheumatoid arthritis(714.0)     patient reported history  . 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 Polyarthralgia   . Anemia   . History of body piercing     tongue ring  . Cholecystitis, acute 11/02/2012   Past Surgical History: Past Surgical History  Procedure Laterality Date  . Laparoscopy  02/06/2012    Procedure: LAPAROSCOPY OPERATIVE;  Surgeon: Delice Lesch, MD;  Location: [facility];  Service: Gynecology;  Laterality: N/A;  . Dilitation & currettage/hystroscopy with novasure ablation  02/06/2012    Procedure: DILATATION & CURETTAGE/HYSTEROSCOPY WITH NOVASURE ABLATION;  Surgeon: Delice Lesch, MD;  Location: [facility];  Service: Gynecology;  Laterality: N/A;   novasure  . Cystoscopy  02/06/2012    Procedure: CYSTOSCOPY;  Surgeon: Delice Lesch, MD;  Location: [facility];  Service: Gynecology;  Laterality: N/A;  . Ovarian cyst removal  02/06/2012    Procedure: OVARIAN CYSTECTOMY;  Surgeon: Delice Lesch, MD;  Location: [facility];  Service: Gynecology;  Laterality: Bilateral;  . Bilateral salpingectomy  02/06/2012    Procedure: BILATERAL SALPINGECTOMY;  Surgeon: Delice Lesch, MD;  Location: [facility];  Service: Gynecology;  Laterality: Bilateral;  partial bilateral  salpingectomy  . Total hip arthroplasty Right 10/09/2012    Procedure: RIGHT TOTAL HIP ARTHROPLASTY;  Surgeon: Gearlean Alf, MD;  Location: [facility];  Service: Orthopedics;  Laterality: Right;  . Cholecystectomy N/A 10/31/2012    Procedure: LAPAROSCOPIC CHOLECYSTECTOMY WITH INTRAOPERATIVE CHOLANGIOGRAM;  Surgeon: Edward Jolly, MD;  Location: [facility];  Service: General;  Laterality: N/A;   Social History: Social History  Substance Use Topics  . Smoking status: Former Smoker -- 1.00 packs/day for 25  years    Types: Cigarettes    Quit date: 10/02/2011  . Smokeless tobacco: Never Used  . Alcohol Use: 0.6 oz/week    1 Glasses of wine per week     Comment: occ.   Additional social history: former smoker, no alcohol or drugs  Please also refer to relevant sections of EMR.  Family History: Family History  Problem Relation Age of Onset  . Heart disease Maternal Grandmother   . Diabetes Maternal Grandmother   . Hypertension Maternal Grandmother   . Arthritis Maternal Grandmother   . Diabetes Mother     insulin dependent  . Hypertension Mother   . Other Father     unknown  . Multiple sclerosis Sister   . Cancer Neg Hx   . Stroke Neg Hx    Allergies and Medications: No Known Allergies No current facility-administered medications on file prior to encounter.   Current Outpatient Prescriptions on File Prior to Encounter  Medication Sig Dispense Refill  . naproxen (NAPROSYN) 250 MG tablet Take 500 mg by mouth 2 (two) times daily as needed (pain).    Marland Kitchen terbinafine (LAMISIL AT) 1 % cream Apply 1 application topically 2 (two) times daily. 30 g 0    Objective: BP 106/45 mmHg  Pulse 78  Temp(Src) 98.1 F (36.7 C) (Oral)  Resp 18  SpO2 98% Exam: General: obese, sitting up in bed, pleasant and cooperative Eyes: PERRL, EOMI, no conjunctival injection ENTM: oropharynx clear, nares patent w/o congestion, no supplemental O2 in place Neck: supple, large given obesity, no palpable mass or nodules, acanthosis nigricans present Cardiovascular: RRR, no murmurs Respiratory: Bilateral diminished air movement, scattered high pitched exp wheezes bilaterally seem improved from previous reports, some focal crackles Right lower field. Speaks full sentences. No tachypnea. Abdomen: obese, soft, NTND, +active BS MSK: mild bilateral lower ribcage +TTP. Back non-tender without deformities. Lower extremities non-tender, no edema, no erythema, calves symmetrical Skin: warm, dry, notable rash on left  lower extremity lower leg and ankle with approx 4-5 cm patch Neuro: AAO x 3, grossly non-focal, intact distal muscle strength  Labs and Imaging: CBC BMET   Recent Labs Lab 10/29/14 1618  WBC 9.3  HGB 12.3  HCT 38.8  PLT 333    Recent Labs Lab 10/29/14 1618  NA 138  K 3.7  CL 107  CO2 22  BUN 8  CREATININE 0.78  GLUCOSE 109*  CALCIUM 9.0     Lactic Acid 1.23  10/20 CXR 2 view IMPRESSION: Streaky infiltrate in right base and lingula. Pneumonia cannot be excluded. Follow-up to resolution is recommended. No pulmonary Edema.  10/20 ED EKG Sinus tachy HR 108, type 1 AV block (PR 160), no acute ST-T wave ischemic changes  Olin Hauser, DO 10/29/2014, 8:18 PM PGY-3, [place] Intern pager: [phone], text pages welcome

## 2014-10-29 NOTE — ED Notes (Signed)
Pt  Reports    Symptoms       Of        Cough   Congested          Stuffy  Nose   X  2  Days         Pt  Reports  Symptoms     Not  releived  By  OTC  MEDS        Pt    Reports         Symptoms  Not  releived  By  otc  meds

## 2014-10-29 NOTE — ED Notes (Signed)
Admitting MD team at bedside.

## 2014-10-29 NOTE — ED Notes (Signed)
Pt comes from [facility] c/o cough and wheezing. Chest xray done at [facility] shows pneumonia. Pt given neb treatment, still has wheezing but reports improved after treatment. Sent here for evlaution for need for possible antibiotics, abnormal chest xray.

## 2014-10-29 NOTE — ED Provider Notes (Addendum)
CSN: [id number]     Arrival date & time 10/29/14  1337 History   First MD Initiated Contact with Patient 10/29/14 1421     Chief Complaint  Patient presents with  . URI   (Consider location/radiation/quality/duration/timing/severity/associated sxs/prior Treatment) Patient is a 49 y.o. female presenting with URI. The history is provided by the patient.  URI Presenting symptoms: congestion, cough and rhinorrhea   Presenting symptoms: no fever   Severity:  Moderate Onset quality:  Gradual Duration:  2 days Progression:  Worsening Chronicity:  Recurrent Relieved by:  None tried Worsened by:  Nothing tried Ineffective treatments:  OTC medications Associated symptoms: wheezing   Risk factors comment:  Former smoker.   Past Medical History  Diagnosis Date  . Hip pain     pt states no cartilage in right hip very painful  . Hearing loss   . Routine gynecological examination     Dr. Everett Graff, [facility]  . Hypertension     due to diet pills in the past, never on meds; normotensive as of 7/14  . Hypercholesterolemia     prior medication therapy  . Rheumatoid arthritis(714.0)     patient reported history  . 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 Polyarthralgia   . Anemia   . History of body piercing     tongue ring  . Cholecystitis, acute 11/02/2012   Past Surgical History  Procedure Laterality Date  . Laparoscopy  02/06/2012    Procedure: LAPAROSCOPY OPERATIVE;  Surgeon: Delice Lesch, MD;  Location: [facility];  Service: Gynecology;  Laterality: N/A;  . Dilitation & currettage/hystroscopy with novasure ablation  02/06/2012    Procedure: DILATATION & CURETTAGE/HYSTEROSCOPY WITH NOVASURE ABLATION;  Surgeon: Delice Lesch, MD;  Location: [facility];  Service: Gynecology;  Laterality: N/A;   novasure  . Cystoscopy  02/06/2012    Procedure: CYSTOSCOPY;  Surgeon: Delice Lesch, MD;  Location: [facility];  Service: Gynecology;   Laterality: N/A;  . Ovarian cyst removal  02/06/2012    Procedure: OVARIAN CYSTECTOMY;  Surgeon: Delice Lesch, MD;  Location: [facility];  Service: Gynecology;  Laterality: Bilateral;  . Bilateral salpingectomy  02/06/2012    Procedure: BILATERAL SALPINGECTOMY;  Surgeon: Delice Lesch, MD;  Location: [facility];  Service: Gynecology;  Laterality: Bilateral;  partial bilateral salpingectomy  . Total hip arthroplasty Right 10/09/2012    Procedure: RIGHT TOTAL HIP ARTHROPLASTY;  Surgeon: Gearlean Alf, MD;  Location: [facility];  Service: Orthopedics;  Laterality: Right;  . Cholecystectomy N/A 10/31/2012    Procedure: LAPAROSCOPIC CHOLECYSTECTOMY WITH INTRAOPERATIVE CHOLANGIOGRAM;  Surgeon: Edward Jolly, MD;  Location: [facility];  Service: General;  Laterality: N/A;   Family History  Problem Relation Age of Onset  . Heart disease Maternal Grandmother   . Diabetes Maternal Grandmother   . Hypertension Maternal Grandmother   . Arthritis Maternal Grandmother   . Diabetes Mother     insulin dependent  . Hypertension Mother   . Other Father     unknown  . Multiple sclerosis Sister   . Cancer Neg Hx   . Stroke Neg Hx    Social History  Substance Use Topics  . Smoking status: Former Smoker -- 1.00 packs/day for 25 years    Types: Cigarettes    Quit date: 10/02/2011  . Smokeless tobacco: Never Used  . Alcohol Use: 0.6 oz/week    1 Glasses of wine per week  Comment: occ.   OB History    Gravida Para Term Preterm AB TAB SAB Ectopic Multiple Living   0              Review of Systems  Constitutional: Negative.  Negative for fever.  HENT: Positive for congestion, postnasal drip and rhinorrhea.   Respiratory: Positive for cough, shortness of breath and wheezing.   Cardiovascular: Negative.   All other systems reviewed and are negative.   Allergies  Review of patient's allergies indicates no known allergies.  Home Medications   Prior to Admission medications   Medication Sig Start  Date End Date Taking? Authorizing Provider  naproxen (NAPROSYN) 250 MG tablet Take 500 mg by mouth 2 (two) times daily as needed (pain).    Historical Provider, MD  terbinafine (LAMISIL AT) 1 % cream Apply 1 application topically 2 (two) times daily. 07/27/13   Hazel Sams, PA-C   Meds Ordered and Administered this Visit   Medications  albuterol (PROVENTIL) (2.5 MG/3ML) 0.083% nebulizer solution 5 mg (5 mg Nebulization Given 10/29/14 1515)  ipratropium (ATROVENT) nebulizer solution 0.5 mg (0.5 mg Nebulization Given 10/29/14 1515)    BP 127/80 mmHg  Pulse 82  Temp(Src) 98.2 F (36.8 C) (Oral)  Resp 24  SpO2 97% No data found.   Physical Exam  Constitutional: She is oriented to person, place, and time. She appears well-developed and well-nourished. She appears distressed.  HENT:  Right Ear: External ear normal.  Left Ear: External ear normal.  Mouth/Throat: Oropharynx is clear and moist.  Eyes: Conjunctivae are normal. Pupils are equal, round, and reactive to light.  Neck: Normal range of motion. Neck supple.  Cardiovascular: Normal heart sounds and intact distal pulses.   Pulmonary/Chest: She has wheezes. She has rales.  Abdominal: Soft. Bowel sounds are normal. There is no tenderness.  Neurological: She is alert and oriented to person, place, and time.  Skin: Skin is warm and dry.  Nursing note and vitals reviewed.   ED Course  Procedures (including critical care time)  Labs Review Labs Reviewed - No data to display  Imaging Review Dg Chest 2 View  10/29/2014  CLINICAL DATA:  Cough for 2 days EXAM: CHEST  2 VIEW COMPARISON:  07/13/2006 FINDINGS: Cardiomediastinal silhouette is stable. There is streaky infiltrate in right base and lingula. Pneumonia cannot be excluded. Follow-up to resolution is recommended. There is no pulmonary edema. IMPRESSION: Streaky infiltrate in right base and lingula. Pneumonia cannot be excluded. Follow-up to resolution is recommended. No  pulmonary edema. Electronically Signed   By: Lahoma Crocker M.D.   On: 10/29/2014 14:44   X-rays reviewed and report per radiologist.   Visual Acuity Review  Right Eye Distance:   Left Eye Distance:   Bilateral Distance:    Right Eye Near:   Left Eye Near:    Bilateral Near:         MDM   1. CAP (community acquired pneumonia)    Sent to ER for perhaps iv abx or further eval for abnl cxr and asthma related sx.   Billy Fischer, MD 10/29/14 [place], MD 10/29/14 [phone]

## 2014-10-29 NOTE — ED Provider Notes (Addendum)
Complains of productive cough with green sputum and wheezing for the past 2 days. Seen at [facility] urgent care earlier today sent here for further evaluation determined to have pneumonia On exam no distress lungs clear to auscultation, speaks in paragraphs no resp distress. Port score equals 41. Patient suitable candidate for outpatient treatment of pneumonia, however she becomes dyspneic and desaturates to 89% pulse oximetry on room air upon walking after 4 nebulizer treatments and systemic steroids. Patient has element of asthma and has oxygen requirement Chest x-ray viewed by me  Date: 10/29/2014  Rate: 75  Rhythm: normal sinus rhythm  QRS Axis: normal  Intervals: normal  ST/T Wave abnormalities: normal  Conduction Disutrbances: none  Narrative Interpretation: unremarkable     Orlie Dakin, MD 10/29/14 1911  Orlie Dakin, MD 10/29/14 1911  Orlie Dakin, MD 10/29/14 1943

## 2014-10-29 NOTE — ED Notes (Addendum)
While in PT bed, PT O2 at 93% Room Air, [facility] PT from room to outside of POD E. PT SOB and had to use wheelchair to get PT back to room. PT also now has a headache, whizzing.

## 2014-10-29 NOTE — ED Notes (Signed)
Error  In  Charting  Initial triage   resp assessment incorrect  Pt  Has  Bilateral exp  Wheezing  Noted

## 2014-10-29 NOTE — Progress Notes (Signed)
ANTIBIOTIC CONSULT NOTE - INITIAL  Pharmacy Consult for vancomycin and Zosyn Indication: pneumonia  No Known Allergies  Patient Measurements: Height: 5\' 4"  (162.6 cm) (pt reported) Weight: 250 lb (113.399 kg) (pt reported) IBW/kg (Calculated) : 54.7 Adjusted Body Weight: 78.2 kg  Vital Signs: Temp: 98.1 F (36.7 C) (10/20 1609) Temp Source: Oral (10/20 1609) BP: 106/45 mmHg (10/20 1814) Pulse Rate: 78 (10/20 1815) Intake/Output from previous day:   Intake/Output from this shift:    Labs:  Recent Labs  10/29/14 1618  WBC 9.3  HGB 12.3  PLT 333  CREATININE 0.78   Estimated Creatinine Clearance: 102.7 mL/min (by C-G formula based on Cr of 0.78). No results for input(s): VANCOTROUGH, VANCOPEAK, VANCORANDOM, GENTTROUGH, GENTPEAK, GENTRANDOM, TOBRATROUGH, TOBRAPEAK, TOBRARND, AMIKACINPEAK, AMIKACINTROU, AMIKACIN in the last 72 hours.   Microbiology: No results found for this or any previous visit (from the past 720 hour(s)).  Medical History: Past Medical History  Diagnosis Date  . Hip pain     pt states no cartilage in right hip very painful  . Hearing loss   . Routine gynecological examination     Dr. Everett Graff, [facility]  . Hypertension     due to diet pills in the past, never on meds; normotensive as of 7/14  . Hypercholesterolemia     prior medication therapy  . Rheumatoid arthritis(714.0)     patient reported history  . 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 Polyarthralgia   . Anemia   . History of body piercing     tongue ring  . 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 CXr at urgent care shows infiltrates concerning for pneumonia. PMH HTN, HLD, RA. Pharmacy consulted to dose vancomycin and Zosyn for HCAP.  WBC 9.3, afebrile, SCr 0.78, CrCl >100  mL/min  10/20 Vanc >> 10/20 Zosyn >>  10/20 BCx x2 >>  Goal of Therapy:  Vancomycin trough level 15-20 mcg/ml  Plan:  Zosyn 3.375 g q8h (1st dose over 30 min) Vancomycin 2500 mg IV x 1 load Vancomycin 1000 mg IV q12h Expected duration 7 days with resolution of temperature and/or normalization of WBC Measure antibiotic drug levels at steady state Follow up culture results, LOT, clinical improvement  Governor Specking, PharmD Clinical Pharmacy Resident Pager: [phone] 10/29/2014,9:41 PM

## 2014-10-30 ENCOUNTER — Encounter (HOSPITAL_COMMUNITY): Payer: Self-pay | Admitting: *Deleted

## 2014-10-30 LAB — EXPECTORATED SPUTUM ASSESSMENT W REFEX TO RESP CULTURE

## 2014-10-30 LAB — HIV ANTIBODY (ROUTINE TESTING W REFLEX): HIV Screen 4th Generation wRfx: NONREACTIVE

## 2014-10-30 LAB — CBC
HEMATOCRIT: 40.3 % (ref 36.0–46.0)
Hemoglobin: 12.7 g/dL (ref 12.0–15.0)
MCH: 26.4 pg (ref 26.0–34.0)
MCHC: 31.5 g/dL (ref 30.0–36.0)
MCV: 83.8 fL (ref 78.0–100.0)
Platelets: 370 10*3/uL (ref 150–400)
RBC: 4.81 MIL/uL (ref 3.87–5.11)
RDW: 14.2 % (ref 11.5–15.5)
WBC: 10.5 10*3/uL (ref 4.0–10.5)

## 2014-10-30 LAB — BASIC METABOLIC PANEL
Anion gap: 10 (ref 5–15)
BUN: 7 mg/dL (ref 6–20)
CALCIUM: 9.5 mg/dL (ref 8.9–10.3)
CO2: 23 mmol/L (ref 22–32)
CREATININE: 0.82 mg/dL (ref 0.44–1.00)
Chloride: 106 mmol/L (ref 101–111)
GFR calc Af Amer: >60 mL/min (ref >60)
GLUCOSE: 155 mg/dL — AB (ref 65–99)
Potassium: 4.4 mmol/L (ref 3.5–5.1)
Sodium: 139 mmol/L (ref 135–145)

## 2014-10-30 LAB — STREP PNEUMONIAE URINARY ANTIGEN: STREP PNEUMO URINARY ANTIGEN: NEGATIVE

## 2014-10-30 MED ORDER — TRIAMCINOLONE ACETONIDE 0.5 % EX OINT: TOPICAL_OINTMENT | Freq: Two times a day (BID) | CUTANEOUS | Status: DC

## 2014-10-30 MED ORDER — CAMPHOR-MENTHOL 0.5-0.5 % EX LOTN
TOPICAL_LOTION | CUTANEOUS | Status: DC | PRN
  Filled 2014-10-30: qty 222

## 2014-10-30 MED ORDER — DEXAMETHASONE 2 MG PO TABS: 10.0000 mg | ORAL_TABLET | Freq: Every day | ORAL | Status: DC

## 2014-10-30 MED ORDER — INFLUENZA VAC SPLIT QUAD 0.5 ML IM SUSY: 0.5000 mL | PREFILLED_SYRINGE | INTRAMUSCULAR | Status: DC

## 2014-10-30 MED ORDER — AZITHROMYCIN 250 MG PO TABS: 250.0000 mg | ORAL_TABLET | Freq: Every day | ORAL | Status: DC

## 2014-10-30 NOTE — Progress Notes (Signed)
Paged and spoke with Dr Cyndia Skeeters to clarify order for resp virus panel, ? Need FLU PCR instead, Dr Cyndia Skeeters will claify and call back.

## 2014-10-30 NOTE — Progress Notes (Signed)
Pt ambulated 600 feet around nursing station without no acute distress or wheezing.  Patient Saturations on Room Air at Rest = 98%  Patient Saturations on [employer] while Ambulating = 95%  Randon Goldsmith, RN

## 2014-10-30 NOTE — ED Provider Notes (Signed)
CSN: [id number]     Arrival date & time 10/29/14  1546 History   First MD Initiated Contact with Patient 10/29/14 1716     Chief Complaint  Patient presents with  . Pneumonia     (Consider location/radiation/quality/duration/timing/severity/associated sxs/prior Treatment) HPI  Alyssa Rangel is a 49 y.o F with a p,hx of HTn, HLD who presents the emergency department today from an urgent care to be evaluated for pneumonia. Patient states that she was seen at a urgent care earlier today and they performed a chest x-ray which revealed pneumonia. Patient was sent here for possible IV antibiotics. Patient states that she hasn't been feeling well for the last couple of days with aggressively worsening cough. Associated symptoms include chills, night sweats, pain with deep inhalation. Denies fever, vomiting, syncope, headache, blurry vision. Past Medical History  Diagnosis Date  . Hip pain     pt states no cartilage in right hip very painful  . Hearing loss   . Routine gynecological examination     Dr. Everett Graff, [facility]  . Hypertension     due to diet pills in the past, never on meds; normotensive as of 7/14  . Hypercholesterolemia     prior medication therapy  . Rheumatoid arthritis(714.0)     patient reported history  . 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 Polyarthralgia   . Anemia   . History of body piercing     tongue ring  . Cholecystitis, acute 11/02/2012   Past Surgical History  Procedure Laterality Date  . Laparoscopy  02/06/2012    Procedure: LAPAROSCOPY OPERATIVE;  Surgeon: Delice Lesch, MD;  Location: [facility];  Service: Gynecology;  Laterality: N/A;  . Dilitation & currettage/hystroscopy with novasure ablation  02/06/2012    Procedure: DILATATION & CURETTAGE/HYSTEROSCOPY WITH NOVASURE ABLATION;  Surgeon: Delice Lesch, MD;  Location: [facility];  Service: Gynecology;  Laterality: N/A;   novasure  . Cystoscopy   02/06/2012    Procedure: CYSTOSCOPY;  Surgeon: Delice Lesch, MD;  Location: [facility];  Service: Gynecology;  Laterality: N/A;  . Ovarian cyst removal  02/06/2012    Procedure: OVARIAN CYSTECTOMY;  Surgeon: Delice Lesch, MD;  Location: [facility];  Service: Gynecology;  Laterality: Bilateral;  . Bilateral salpingectomy  02/06/2012    Procedure: BILATERAL SALPINGECTOMY;  Surgeon: Delice Lesch, MD;  Location: [facility];  Service: Gynecology;  Laterality: Bilateral;  partial bilateral salpingectomy  . Total hip arthroplasty Right 10/09/2012    Procedure: RIGHT TOTAL HIP ARTHROPLASTY;  Surgeon: Gearlean Alf, MD;  Location: [facility];  Service: Orthopedics;  Laterality: Right;  . Cholecystectomy N/A 10/31/2012    Procedure: LAPAROSCOPIC CHOLECYSTECTOMY WITH INTRAOPERATIVE CHOLANGIOGRAM;  Surgeon: Edward Jolly, MD;  Location: [facility];  Service: General;  Laterality: N/A;   Family History  Problem Relation Age of Onset  . Heart disease Maternal Grandmother   . Diabetes Maternal Grandmother   . Hypertension Maternal Grandmother   . Arthritis Maternal Grandmother   . Diabetes Mother     insulin dependent  . Hypertension Mother   . Other Father     unknown  . Multiple sclerosis Sister   . Cancer Neg Hx   . Stroke Neg Hx    Social History  Substance Use Topics  . Smoking status: Former Smoker -- 1.00 packs/day for 25 years    Types: Cigarettes    Quit date: 10/02/2011  . Smokeless tobacco:  Never Used  . Alcohol Use: 0.6 oz/week    1 Glasses of wine per week     Comment: occ.   OB History    Gravida Para Term Preterm AB TAB SAB Ectopic Multiple Living   0              Review of Systems  All other systems reviewed and are negative.     Allergies  Review of patient's allergies indicates no known allergies.  Home Medications   Prior to Admission medications   Medication Sig Start Date End Date Taking? Authorizing Provider  albuterol (PROVENTIL HFA;VENTOLIN HFA) 108 (90 BASE)  MCG/ACT inhaler Inhale 1-2 puffs into the lungs every 6 (six) hours as needed for wheezing or shortness of breath. 10/29/14   Samantha Tripp [place], PA-C  azithromycin (ZITHROMAX) 250 MG tablet Take 1 tablet (250 mg total) by mouth daily. Take first 2 tablets together, then 1 every day until finished. 10/30/14   Asiyah Cletis Media, MD  dexamethasone (DECADRON) 2 MG tablet Take 5 tablets (10 mg total) by mouth daily. 10/31/14   Asiyah Cletis Media, MD  terbinafine (LAMISIL AT) 1 % cream Apply 1 application topically 2 (two) times daily. Patient not taking: Reported on 10/29/2014 07/27/13   Hazel Sams, PA-C  triamcinolone ointment (KENALOG) 0.5 % Apply topically 2 (two) times daily. Please use for the next week, and then only as needed 10/30/14   Asiyah Cletis Media, MD   BP 130/48 mmHg  Pulse 84  Temp(Src) 97.3 F (36.3 C) (Oral)  Resp 18  Ht 5\' 4"  (1.626 m)  Wt 253 lb 8.5 oz (115 kg)  BMI 43.50 kg/m2  SpO2 98% Physical Exam  Constitutional: She is oriented to person, place, and time. She appears well-developed and well-nourished. No distress.  HENT:  Head: Normocephalic and atraumatic.  Mouth/Throat: Oropharynx is clear and moist. No oropharyngeal exudate.  Eyes: Conjunctivae and EOM are normal. Pupils are equal, round, and reactive to light. Right eye exhibits no discharge. Left eye exhibits no discharge. No scleral icterus.  Neck: Normal range of motion.  Cardiovascular: Normal rate, regular rhythm, normal heart sounds and intact distal pulses.  Exam reveals no gallop and no friction rub.   No murmur heard. Pulmonary/Chest: Effort normal. No respiratory distress. She has wheezes ( diffuse wheezes appreciated throughout all lung fields.). She has no rales. She exhibits no tenderness.  No sensory muscle use.  Abdominal: Soft. She exhibits no distension. There is no tenderness. There is no guarding.  Musculoskeletal: Normal range of motion. She exhibits no edema.  Lymphadenopathy:     She has no cervical adenopathy.  Neurological: She is alert and oriented to person, place, and time. No cranial nerve deficit.  Strength 5/5 throughout. No sensory deficits.    Skin: Skin is warm and dry. No rash noted. She is not diaphoretic. No erythema. No pallor.  Psychiatric: She has a normal mood and affect. Her behavior is normal.  Nursing note and vitals reviewed.   ED Course  Procedures (including critical care time) Labs Review Labs Reviewed  BASIC METABOLIC PANEL - Abnormal; Notable for the following:    Glucose, Bld 109 (*)    All other components within normal limits  BASIC METABOLIC PANEL - Abnormal; Notable for the following:    Glucose, Bld 155 (*)    All other components within normal limits  CULTURE, EXPECTORATED SPUTUM-ASSESSMENT  CULTURE, BLOOD (ROUTINE X 2)  CULTURE, BLOOD (ROUTINE X 2)  RESPIRATORY VIRUS PANEL  CBC WITH DIFFERENTIAL/PLATELET  HIV ANTIBODY (ROUTINE TESTING)  STREP PNEUMONIAE URINARY ANTIGEN  CBC  LEGIONELLA PNEUMOPHILA SEROGP 1 UR AG  HEMOGLOBIN A1C  I-STAT CG4 LACTIC ACID, ED    Imaging Review Dg Chest 2 View  10/29/2014  CLINICAL DATA:  Cough for 2 days EXAM: CHEST  2 VIEW COMPARISON:  07/13/2006 FINDINGS: Cardiomediastinal silhouette is stable. There is streaky infiltrate in right base and lingula. Pneumonia cannot be excluded. Follow-up to resolution is recommended. There is no pulmonary edema. IMPRESSION: Streaky infiltrate in right base and lingula. Pneumonia cannot be excluded. Follow-up to resolution is recommended. No pulmonary edema. Electronically Signed   By: Lahoma Crocker M.D.   On: 10/29/2014 14:44   I have personally reviewed and evaluated these images and lab results as part of my medical decision-making.   EKG Interpretation   Date/Time:  Thursday October 29 2014 19:14:07 EDT Ventricular Rate:  108 PR Interval:  160 QRS Duration: 87 QT Interval:  336 QTC Calculation: 450 R Axis:   -6 Text Interpretation:  ED  PHYSICIAN INTERPRETATION AVAILABLE IN [place]  [place] Confirmed by TEST, Record ([id number]) on 10/30/2014 7:23:31 AM      MDM   Final diagnoses:  CAP (community acquired pneumonia)    Patient sent to the emergency department from urgent care after getting a chest x-ray done that revealed pneumonia. Patient complaining of worsening cough and shortness of breath over the last 3 days. Patient currently afebrile. Patient given a total of 4 reading treatments as well as systemic steroids.  WBC within normal limits. Lactic acid normal.  Plan to discharge home on azithromycin for community acquired pneumonia. Port score 41 which indicates that the patient is suitable candidate for outpatient treatment of pneumonia. However prior to discharge patient was ambulated with pulse oximetry and patient desatted to the mid 80s while ambulating and became extremely short of breath. At rest patient is satting 93% on room air. With this new finding tonsil comfortable discharging patient. Patient given 1 g IV ceftriaxone.  Spoke with hospitalist who will admit patient to their service for observation and additional antibiotics. Blood cultures drawn.     Dondra Spry [place], PA-C 10/30/14 [place], MD 10/30/14 [phone]

## 2014-10-30 NOTE — Progress Notes (Signed)
Paged again, no answer from previous page, return call from Dr Cyndia Skeeters and clarification obtained to do the Resp Virus Panel swab.

## 2014-10-30 NOTE — Discharge Instructions (Signed)
Community-Acquired Pneumonia, Adult Pneumonia is an infection of the lungs. One type of pneumonia can happen while a person is in a hospital. A different type can happen when a person is not in a hospital (community-acquired pneumonia). It is easy for this kind to spread from person to person. It can spread to you if you breathe near an infected person who coughs or sneezes. Some symptoms include:  A dry cough.  A wet (productive) cough.  Fever.  Sweating.  Chest pain. HOME CARE  Take over-the-counter and prescription medicines only as told by your doctor.  Only take cough medicine if you are losing sleep.  If you were prescribed an antibiotic medicine, take it as told by your doctor. Do not stop taking the antibiotic even if you start to feel better.  Sleep with your head and neck raised (elevated). You can do this by putting a few pillows under your head, or you can sleep in a recliner.  Do not use tobacco products. These include cigarettes, chewing tobacco, and e-cigarettes. If you need help quitting, ask your doctor.  Drink enough water to keep your pee (urine) clear or pale yellow. A shot (vaccine) can help prevent pneumonia. Shots are often suggested for:  People older than 51 years of age.  People older than 51 years of age:  Who are having cancer treatment.  Who have long-term (chronic) lung disease.  Who have problems with their body's defense system (immune system). You may also prevent pneumonia if you take these actions:  Get the flu (influenza) shot every year.  Go to the dentist as often as told.  Wash your hands often. If soap and water are not available, use hand sanitizer. GET HELP IF:  You have a fever.  You lose sleep because your cough medicine does not help. GET HELP RIGHT AWAY IF:  You are short of breath and it gets worse.  You have more chest pain.  Your sickness gets worse. This is very serious if:  You are an older adult.  Your  body's defense system is weak.  You cough up blood.   This information is not intended to replace advice given to you by your health care provider. Make sure you discuss any questions you have with your health care provider.   Follow up with PCP in 3 days for repeat Chest x-ray. Return to the Emergency Department if you experience worsening of your symptoms, fever.   Follow-up Information    Follow up with Alyssa Doss, DO On 11/03/2014.   Specialty:  Family Medicine   Why:  3:15   Contact information:   [address] [place] [address] [phone]

## 2014-10-30 NOTE — Progress Notes (Signed)
Family Medicine Teaching Service Daily Progress Note Intern Pager: [phone]  Patient name: Alyssa Rangel Medical record number: [id number] Date of birth: 10-Apr-1965 Age: 49 y.o. Gender: female  Primary Care Provider: Crisoforo Oxford, PA-C Consultants: None Code Status: Full   Pt Overview and Major Events to Date:   Assessment and Plan: Alyssa Rangel is a 49 y.o. 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 PMH is significant for RA vs OA (s/p R-total hip replace 2014), HTN, Obesity, Pre-DM, Former smoker 75 yr  # HCAP, bilateral infiltrates, with bronchospasm. Patient afebrile, saturations back to normal with walk test. On admit with URI symptoms with productive cough, subjective chills but afebrile, also with wheezing and dyspnea. Pneumonia on CXR with bilateral streaking infiltrates, considered HCAP (d/t exposure, works as CNA in SNF), no known prior asthma / COPD, but does have significant prior smoking hx >30 years (former smoker), notable wheezing on admit since improved with nebulizer therapy, likely bronchospasm with URI/PNA vs possible underlying COPD. No active chest pain, no cardiac history. No history of DVT/PE, no evidence of DVT on exam. Well's (score 0). Also less likely main etiology with infectious symptoms currently but consider underlying infiltrative lung disease in setting of possible RA. - vitals per protocol, cont pulse ox - Will stop IV antiibotics due to vast clinical improvement overnight. Will continue treatment for CAP with azithromycin. - Duonebs scheduled q 4 hr overnight - incentive spiro - blood cultures collected in ED x 2 (10/20>>) - strep pneumo antigens negative  - legionella /  HIV negative  # HTN BP stable on admit - Not on any anti-HTN meds - Monitor  # Rheumatoid Arthritis, OA, s/p R-THR Stable on admit  without significant pain. Followed by Orthopedics. No regular pain regimen. - Tylenol PRN  # Pre-Diabetes, Morbid Obesity, HLD Last A1c 6.0 (07/2012), may have had later A1c at PCP office (not aware of values). BMI >43.5. Last lipid panel 2014. Not on statin therapy. - Check A1c in AM - Recommend outpatient weight loss, routine follow-up lipids  # ECZEMA / PSORIASIS Consistent with inflammed atopic dermatitis patches vs plaques on left lower extremity, mostly with itching, patient has significantly worsened by scratching. No prior diagnosis or therapy. Most likely eczema with itching, considered psoriasis as may have underlying psoriatic arthritis. - Start Triamcinolone 0.5% ointment BID, would complete at least 2 week course - Will need to start daily moisturizer - Benadryl topical cream PRN itch  FEN/GI: SLIV, Heart Healthy diet Prophylaxis: Lovenox SQ  Subjective:  Patient doing great this morning. Patient is ready to go home.   Objective: Temp:  [97.3 F (36.3 C)-98.5 F (36.9 C)] 97.3 F (36.3 C) (10/21 0621) Pulse Rate:  [78-109] 84 (10/21 0621) Resp:  [18-36] 18 (10/21 0621) BP: (106-147)/(44-84) 130/48 mmHg (10/21 0621) SpO2:  [92 %-98 %] 97 % (10/21 0725) Weight:  [250 lb (113.399 kg)-253 lb 8.5 oz (115 kg)] 253 lb 8.5 oz (115 kg) (10/20 2249) Physical Exam: General: Patient sitting up in bed, pleasant, NAD  Cardiovascular: RRR, no  Murmurs, gallops, rubs, 2+ radial pulses  Respiratory: slight bibasilar crackles,  Abdomen: BS+, No ttp, no rebound  Extremities: No lower extremity edema,   Laboratory:  Recent Labs Lab 10/29/14 1618 10/30/14 0542  WBC 9.3 10.5  HGB 12.3 12.7  HCT 38.8 40.3  PLT 333 370    Recent Labs Lab 10/29/14 1618  10/30/14 0542  NA 138 139  K 3.7 4.4  CL 107 106  CO2 22 23  BUN 8 7  CREATININE 0.78 0.82  CALCIUM 9.0 9.5  GLUCOSE 109* 155*    Imaging/Diagnostic Tests: Dg Chest 2 View  10/29/2014  CLINICAL DATA:  Cough for 2  days EXAM: CHEST  2 VIEW COMPARISON:  07/13/2006 FINDINGS: Cardiomediastinal silhouette is stable. There is streaky infiltrate in right base and lingula. Pneumonia cannot be excluded. Follow-up to resolution is recommended. There is no pulmonary edema. IMPRESSION: Streaky infiltrate in right base and lingula. Pneumonia cannot be excluded. Follow-up to resolution is recommended. No pulmonary edema. Electronically Signed   By: Lahoma Crocker M.D.   On: 10/29/2014 14:44    Neely Cecena Cletis Media, MD 10/30/2014, 8:35 AM PGY-1, [facility] Intern pager: [phone], text pages welcome

## 2014-10-30 NOTE — Progress Notes (Signed)
Paged [phone] MD (Dr Cyndia Skeeters) number again, still waiting for group to round and clarify nasal swab order.

## 2014-10-30 NOTE — Progress Notes (Signed)
Dr Cyndia Skeeters called back, stated that group would round on pt first then decide which nasal swab to perform on pt.

## 2014-10-30 NOTE — Progress Notes (Signed)
THEONA MUHS [id number] Code Status:Full   Admission Data: 10/30/2014 12:10 AM Attending Provider:  McDiarmid PYP:[id number], DAVID Audelia Acton, PA-C Consults/ Treatment Team:    DYLLAN KATS is a 49 y.o. female patient admitted from ED awake, alert - oriented  X 4 - no acute distress noted.  VSS - Blood pressure 147/60, pulse 85, temperature 98.5 F (36.9 C), temperature source Oral, resp. rate 20, height 5\' 4"  (1.626 m), weight 115 kg (253 lb 8.5 oz), SpO2 96 %.    IV in place, occlusive dsg intact without redness.  Orientation to room, and floor completed with information packet given to patient/family.  Patient declined safety video at this time.  Admission INP armband ID verified with patient/family, and in place.   SR up x 2, fall assessment complete, with patient and family able to verbalize understanding of risk associated with falls, and verbalized understanding to call nsg before up out of bed.  Call light within reach, patient able to voice, and demonstrate understanding.  Skin, clean-dry- intact evidence of dermatitis on lower left leg and ankle with healed scar. .   No evidence of skin break down noted on exam.     Will cont to eval and treat per MD orders.  Vertell Limber, RN 10/30/2014 12:10 AM

## 2014-10-30 NOTE — Discharge Summary (Signed)
[facility] Discharge Summary  Patient name: Alyssa Rangel Medical record number: [id number] Date of birth: Sep 15, 1965 Age: 49 y.o. Gender: female Date of Admission: 10/29/2014  Date of Discharge: 10/30/2014  Admitting Physician: Blane Ohara McDiarmid, MD  Primary Care Provider: Crisoforo Oxford, PA-C Consultants: None   Indication for Hospitalization: CAP  Discharge Diagnoses/Problem List:  HTN  Obesity  Rheumatoid Arthritis  Eczema/Psoriasis   Disposition: Home   Discharge Condition: Good   Discharge Exam:  General: Patient sitting up in bed, pleasant, NAD  Cardiovascular: RRR, no Murmurs, gallops, rubs, 2+ radial pulses  Respiratory: slight bibasilar crackles, no increased WOB, on room air   Abdomen: BS+, No ttp, no rebound  Extremities: No lower extremity edema,    Brief Hospital Course:  Patient was admitted with bilateral infiltrates due community acquired pneumonia. Patient was desaturating in the ED into mid-80s with a walk test. Patient was admitted to family medicine, and as there was concern for more serious pneumonia, patient was started on board spectrum antibiotics. Patient remained afebrile, normotensive. She did not have leukocytosis during stay. Patient received Duonebs and single dose of Decadron in the ED. Patient was brought to the floor and was saturating 97% during walk test the next morning. Patient felt good and looked well, with discharge home that next day with. It is possible patient's desaturations were due to esophogeal spasm. Concern for PE was considerd, however negative by the wells criteria .  Issues for Follow Up:  1. Follow up on patients A1C, legionella, respiratory panel, and blood cultures in the clinic 2. Patient with fasting blood glucose was 155 in the morning, this provides [place] with a diagnosis of diabetes  3. Patient is receiving steroids 1 more day given, patient desaturations early on during the  admission  4. Follow up on patient's lung 5. Continue 5 day course of azithromycin, follow up   Significant Procedures: None   Significant Labs and Imaging:   Recent Labs Lab 10/29/14 1618 10/30/14 0542  WBC 9.3 10.5  HGB 12.3 12.7  HCT 38.8 40.3  PLT 333 370    Recent Labs Lab 10/29/14 1618 10/30/14 0542  NA 138 139  K 3.7 4.4  CL 107 106  CO2 22 23  GLUCOSE 109* 155*  BUN 8 7  CREATININE 0.78 0.82  CALCIUM 9.0 9.5    Results/Tests Pending at Time of Discharge: A1C, legionella, respiratory panel, and blood cultures   Discharge Medications:    Medication List    STOP taking these medications        terbinafine 1 % cream  Commonly known as:  LAMISIL AT      TAKE these medications        albuterol 108 (90 BASE) MCG/ACT inhaler  Commonly known as:  PROVENTIL HFA;VENTOLIN HFA  Inhale 1-2 puffs into the lungs every 6 (six) hours as needed for wheezing or shortness of breath.     azithromycin 250 MG tablet  Commonly known as:  ZITHROMAX  Take 1 tablet (250 mg total) by mouth daily. Take first 2 tablets together, then 1 every day until finished.     dexamethasone 2 MG tablet  Commonly known as:  DECADRON  Take 5 tablets (10 mg total) by mouth daily.  Start taking on:  10/31/2014     triamcinolone ointment 0.5 %  Commonly known as:  KENALOG  Apply topically 2 (two) times daily. Please use for the next week, and then only as needed  Discharge Instructions: Please refer to Patient Instructions section of EMR for full details.  Patient was counseled important signs and symptoms that should prompt return to medical care, changes in medications, dietary instructions, activity restrictions, and follow up appointments.   Follow-Up Appointments:     Follow-up Information    Follow up with Ronnie Doss, DO On 11/03/2014.   Specialty:  Family Medicine   Why:  3:15   Contact information:   [address] [place] [address] [phone]        Allex Lapoint Cletis Media, MD 10/30/2014, 11:26 PM PGY-1, [place]

## 2014-10-31 LAB — HEMOGLOBIN A1C
Hgb A1c MFr Bld: 6.4 % — ABNORMAL HIGH (ref 4.8–5.6)
Mean Plasma Glucose: 137 mg/dL

## 2014-11-01 LAB — RESPIRATORY VIRUS PANEL
Adenovirus: NEGATIVE
Influenza A: NEGATIVE
Influenza B: NEGATIVE
Metapneumovirus: NEGATIVE
PARAINFLUENZA 2 A: NEGATIVE
PARAINFLUENZA 3 A: NEGATIVE
Parainfluenza 1: NEGATIVE
RHINOVIRUS: POSITIVE — AB
Respiratory Syncytial Virus A: NEGATIVE
Respiratory Syncytial Virus B: NEGATIVE

## 2014-11-02 ENCOUNTER — Telehealth: Payer: Self-pay | Admitting: Medical

## 2014-11-02 LAB — LEGIONELLA PNEUMOPHILA SEROGP 1 UR AG: L. pneumophila Serogp 1 Ur Ag: NEGATIVE

## 2014-11-02 NOTE — Telephone Encounter (Signed)
This patient was recently hospitalized.   Please call patient about hospital continuity of care.    See how they are doing. Review any medication changes per discharge summary. Schedule follow up appointment and make note on schedule that this is hospital f/u continuity of care visit.   

## 2014-11-03 ENCOUNTER — Ambulatory Visit (INDEPENDENT_AMBULATORY_CARE_PROVIDER_SITE_OTHER): Payer: Self-pay | Admitting: Family Medicine

## 2014-11-03 ENCOUNTER — Encounter: Payer: Self-pay | Admitting: Family Medicine

## 2014-11-03 VITALS — BP 152/78 | HR 89 | Temp 98.4°F | Wt 251.8 lb

## 2014-11-03 DIAGNOSIS — R062 Wheezing: Secondary | ICD-10-CM

## 2014-11-03 DIAGNOSIS — J189 Pneumonia, unspecified organism: Secondary | ICD-10-CM

## 2014-11-03 LAB — CULTURE, BLOOD (ROUTINE X 2)
Culture: NO GROWTH
Culture: NO GROWTH

## 2014-11-03 NOTE — Patient Instructions (Signed)
I want you to use your Albuterol 4 puffs every 6 hours scheduled for the next 48 hours.  You may then resume as needed use.  If you develop worsening shortness of breath, fevers, chills, worsening cough, or chest pain, please go to the Emergency room.  Otherwise, plan to follow up with Dr Juanito Doom in 2 weeks for blood pressure and sugar check.  Community-Acquired Pneumonia, Adult Pneumonia is an infection of the lungs. There are different types of pneumonia. One type can develop while a person is in a hospital. A different type, called community-acquired pneumonia, develops in people who are not, or have not recently been, in the hospital or other health care facility.  CAUSES Pneumonia may be caused by bacteria, viruses, or funguses. Community-acquired pneumonia is often caused by Streptococcus pneumonia bacteria. These bacteria are often passed from one person to another by breathing in droplets from the cough or sneeze of an infected person. RISK FACTORS The condition is more likely to develop in:  People who havechronic diseases, such as chronic obstructive pulmonary disease (COPD), asthma, congestive heart failure, cystic fibrosis, diabetes, or kidney disease.  People who haveearly-stage or late-stage HIV.  People who havesickle cell disease.  People who havehad their spleen removed (splenectomy).  People who havepoor Human resources officer.  People who havemedical conditions that increase the risk of breathing in (aspirating) secretions their own mouth and nose.   People who havea weakened immune system (immunocompromised).  People who smoke.  People whotravel to areas where pneumonia-causing germs commonly exist.  People whoare around animal habitats or animals that have pneumonia-causing germs, including birds, bats, rabbits, cats, and farm animals. SYMPTOMS Symptoms of this condition include:  Adry cough.  A wet (productive) cough.  Fever.  Sweating.  Chest pain,  especially when breathing deeply or coughing.  Rapid breathing or difficulty breathing.  Shortness of breath.  Shaking chills.  Fatigue.  Muscle aches. DIAGNOSIS Your health care provider will take a medical history and perform a physical exam. You may also have other tests, including:  Imaging studies of your chest, including X-rays.  Tests to check your blood oxygen level and other blood gases.  Other tests on blood, mucus (sputum), fluid around your lungs (pleural fluid), and urine. If your pneumonia is severe, other tests may be done to identify the specific cause of your illness. TREATMENT The type of treatment that you receive depends on many factors, such as the cause of your pneumonia, the medicines you take, and other medical conditions that you have. For most adults, treatment and recovery from pneumonia may occur at home. In some cases, treatment must happen in a hospital. Treatment may include:  Antibiotic medicines, if the pneumonia was caused by bacteria.  Antiviral medicines, if the pneumonia was caused by a virus.  Medicines that are given by mouth or through an IV tube.  Oxygen.  Respiratory therapy. Although rare, treating severe pneumonia may include:  Mechanical ventilation. This is done if you are not breathing well on your own and you cannot maintain a safe blood oxygen level.  Thoracentesis. This procedureremoves fluid around one lung or both lungs to help you breathe better. HOME CARE INSTRUCTIONS  Take over-the-counter and prescription medicines only as told by your health care provider.  Only takecough medicine if you are losing sleep. Understand that cough medicine can prevent your body's natural ability to remove mucus from your lungs.  If you were prescribed an antibiotic medicine, take it as told by your health care  provider. Do not stop taking the antibiotic even if you start to feel better.  Sleep in a semi-upright position at night. Try  sleeping in a reclining chair, or place a few pillows under your head.  Do not use tobacco products, including cigarettes, chewing tobacco, and e-cigarettes. If you need help quitting, ask your health care provider.  Drink enough water to keep your urine clear or pale yellow. This will help to thin out mucus secretions in your lungs. PREVENTION There are ways that you can decrease your risk of developing community-acquired pneumonia. Consider getting a pneumococcal vaccine if:  You are older than 51 years of age.  You are older than 52 years of age and are undergoing cancer treatment, have chronic lung disease, or have other medical conditions that affect your immune system. Ask your health care provider if this applies to you. There are different types and schedules of pneumococcal vaccines. Ask your health care provider which vaccination option is best for you. You may also prevent community-acquired pneumonia if you take these actions:  Get an influenza vaccine every year. Ask your health care provider which type of influenza vaccine is best for you.  Go to the dentist on a regular basis.  Wash your hands often. Use hand sanitizer if soap and water are not available. SEEK MEDICAL CARE IF:  You have a fever.  You are losing sleep because you cannot control your cough with cough medicine. SEEK IMMEDIATE MEDICAL CARE IF:  You have worsening shortness of breath.  You have increased chest pain.  Your sickness becomes worse, especially if you are an older adult or have a weakened immune system.  You cough up blood.   This information is not intended to replace advice given to you by your health care provider. Make sure you discuss any questions you have with your health care provider.   Document Released: 12/26/2004 Document Revised: 09/16/2014 Document Reviewed: 04/22/2014 Elsevier Interactive Patient Education [employer].

## 2014-11-03 NOTE — Progress Notes (Signed)
    Subjective: CC: HFU pna HPI: Patient is a 49 y.o. female presenting to clinic today for follow up. Concerns today include:  1. PNA Patient reports that they were discharged on 10/30/14.  She notes that she has continued to wheeze.  She completed abx on 11/03/14.  Completed extra day of steroid.  She has needed to use her Albuterol only once today after being exposed to bleach.  She has used inhaler only once daily since discharge.  Denies any fevers, chills, CP, nausea or vomiting.  Continuing a cough.  Cough is productive, greenish.  Occ SOB, has been using albuterol during those times.  Social History Reviewed: non smoker. FamHx and MedHx updated.  Please see EMR.  ROS: Per HPI  Objective: Office vital signs reviewed. BP 152/78 mmHg  Pulse 89  Temp(Src) 98.4 F (36.9 C) (Oral)  Wt 251 lb 12.8 oz (114.216 kg)  SpO2 97%  Physical Examination:  General: Awake, alert, well nourished, NAD HEENT: Normal    Neck: No masses palpated. No LAD    Ears: TMs intact, normal light reflex, no erythema, no bulging    Eyes: PERRLA, EOMI    Nose: nasal turbinates moist    Throat: MMM, no erythema Cardio: RRR, S1S2 heard, no murmurs appreciated Pulm: Slight wheeze appreciated in L apex that cleared with cough, no rhonchi or rales, WOB normal, +coughing during appt Extremities: WWP, No edema, cyanosis or clubbing; +2 pulses bilaterally MSK: Normal gait and station  10/30/14 labs reviewed: Legionella negative RSP: + rhinovirus.  Assessment/ Plan: 49 y.o. female with  1. CAP (community acquired pneumonia).  Afebrile.  Completed abx course.  No focal findings on exam.  Cough improved after Duoneb administered in office. - Recommend continuing Albuterol scheduled 4 puffs q6 hours for next 48 hours.  Then use PRN as directed - Return precautions reviewed, see AVS - Follow up with Dr Juanito Doom as needed  2. Wheezing.  Likely postinfectious inflammation.  Patient not using Albuterol  regularly. - Albuterol as above - Return precautions reviewed.  Janora Norlander, DO PGY-2, [place]

## 2014-11-03 NOTE — Telephone Encounter (Signed)
Pt was dismissed from this practice.

## 2014-11-06 ENCOUNTER — Inpatient Hospital Stay: Payer: Self-pay | Admitting: Family Medicine

## 2014-11-18 ENCOUNTER — Ambulatory Visit (INDEPENDENT_AMBULATORY_CARE_PROVIDER_SITE_OTHER): Payer: Self-pay | Admitting: Family Medicine

## 2014-11-18 ENCOUNTER — Encounter: Payer: Self-pay | Admitting: Family Medicine

## 2014-11-18 VITALS — BP 142/84 | HR 106 | Temp 98.0°F | Ht 64.0 in | Wt 258.0 lb

## 2014-11-18 DIAGNOSIS — I10 Essential (primary) hypertension: Secondary | ICD-10-CM

## 2014-11-18 NOTE — Patient Instructions (Addendum)
Thank you for coming in today, it was so nice to meet you! Today we discussed your blood pressure and swelling in your legs. For the swelling in your legs, you can try compression stockings. A great web site to buy from is [email or web address]  I would like to see you again in a month for a physical exam.  Please contact Dr Jenne Campus for a nutrition appointment to discuss your nutrition needs.  [phone].   If you have any questions or concerns, please do not hesitate to call the office at [phone].  Sincerely,  Smitty Cords, MD   Low-Sodium Eating Plan Sodium raises blood pressure and causes water to be held in the body. Getting less sodium from food will help lower your blood pressure, reduce any swelling, and protect your heart, liver, and kidneys. We get sodium by adding salt (sodium chloride) to food. Most of our sodium comes from canned, boxed, and frozen foods. Restaurant foods, fast foods, and pizza are also very high in sodium. Even if you take medicine to lower your blood pressure or to reduce fluid in your body, getting less sodium from your food is important. WHAT IS MY PLAN? Most people should limit their sodium intake to 2,300 mg a day. Your health care provider recommends that you limit your sodium intake to __________ a day.  WHAT DO I NEED TO KNOW ABOUT THIS EATING PLAN? For the low-sodium eating plan, you will follow these general guidelines:  Choose foods with a % Daily Value for sodium of less than 5% (as listed on the food label).   Use salt-free seasonings or herbs instead of table salt or sea salt.   Check with your health care provider or pharmacist before using salt substitutes.   Eat fresh foods.  Eat more vegetables and fruits.  Limit canned vegetables. If you do use them, rinse them well to decrease the sodium.   Limit cheese to 1 oz (28 g) per day.   Eat lower-sodium products, often labeled as "lower sodium" or "no salt  added."  Avoid foods that contain monosodium glutamate (MSG). MSG is sometimes added to [place] food and some canned foods.  Check food labels (Nutrition Facts labels) on foods to learn how much sodium is in one serving.  Eat more home-cooked food and less restaurant, buffet, and fast food.  When eating at a restaurant, ask that your food be prepared with less salt, or no salt if possible.  HOW DO I READ FOOD LABELS FOR SODIUM INFORMATION? The Nutrition Facts label lists the amount of sodium in one serving of the food. If you eat more than one serving, you must multiply the listed amount of sodium by the number of servings. Food labels may also identify foods as:  Sodium free--Less than 5 mg in a serving.  Very low sodium--35 mg or less in a serving.  Low sodium--140 mg or less in a serving.  Light in sodium--50% less sodium in a serving. For example, if a food that usually has 300 mg of sodium is changed to become light in sodium, it will have 150 mg of sodium.  Reduced sodium--25% less sodium in a serving. For example, if a food that usually has 400 mg of sodium is changed to reduced sodium, it will have 300 mg of sodium. WHAT FOODS CAN I EAT? Grains Low-sodium cereals, including oats, puffed wheat and rice, and shredded wheat cereals. Low-sodium crackers. Unsalted rice and pasta. Lower-sodium bread.  Vegetables Frozen or fresh vegetables.  Low-sodium or reduced-sodium canned vegetables. Low-sodium or reduced-sodium tomato sauce and paste. Low-sodium or reduced-sodium tomato and vegetable juices.  Fruits Fresh, frozen, and canned fruit. Fruit juice.  Meat and Other Protein Products Low-sodium canned tuna and salmon. Fresh or frozen meat, poultry, seafood, and fish. Lamb. Unsalted nuts. Dried beans, peas, and lentils without added salt. Unsalted canned beans. Homemade soups without salt. Eggs.  Dairy Milk. Soy milk. Ricotta cheese. Low-sodium or reduced-sodium cheeses.  Yogurt.  Condiments Fresh and dried herbs and spices. Salt-free seasonings. Onion and garlic powders. Low-sodium varieties of mustard and ketchup. Fresh or refrigerated horseradish. Lemon juice.  Fats and Oils Reduced-sodium salad dressings. Unsalted butter.  Other Unsalted popcorn and pretzels.  The items listed above may not be a complete list of recommended foods or beverages. Contact your dietitian for more options. WHAT FOODS ARE NOT RECOMMENDED? Grains Instant hot cereals. Bread stuffing, pancake, and biscuit mixes. Croutons. Seasoned rice or pasta mixes. Noodle soup cups. Boxed or frozen macaroni and cheese. Self-rising flour. Regular salted crackers. Vegetables Regular canned vegetables. Regular canned tomato sauce and paste. Regular tomato and vegetable juices. Frozen vegetables in sauces. Salted [place] fries. Olives. Angie Fava. Relishes. Sauerkraut. Salsa. Meat and Other Protein Products Salted, canned, smoked, spiced, or pickled meats, seafood, or fish. Bacon, ham, sausage, hot dogs, corned beef, chipped beef, and packaged luncheon meats. Salt pork. Jerky. Pickled herring. Anchovies, regular canned tuna, and sardines. Salted nuts. Dairy Processed cheese and cheese spreads. Cheese curds. Blue cheese and cottage cheese. Buttermilk.  Condiments Onion and garlic salt, seasoned salt, table salt, and sea salt. Canned and packaged gravies. Worcestershire sauce. Tartar sauce. Barbecue sauce. Teriyaki sauce. Soy sauce, including reduced sodium. Steak sauce. Fish sauce. Oyster sauce. Cocktail sauce. Horseradish that you find on the shelf. Regular ketchup and mustard. Meat flavorings and tenderizers. Bouillon cubes. Hot sauce. Tabasco sauce. Marinades. Taco seasonings. Relishes. Fats and Oils Regular salad dressings. Salted butter. Margarine. Ghee. Bacon fat.  Other Potato and tortilla chips. Corn chips and puffs. Salted popcorn and pretzels. Canned or dried soups. Pizza. Frozen  entrees and pot pies.  The items listed above may not be a complete list of foods and beverages to avoid. Contact your dietitian for more information.   This information is not intended to replace advice given to you by your health care provider. Make sure you discuss any questions you have with your health care provider.   Document Released: 06/17/2001 Document Revised: 01/16/2014 Document Reviewed: 10/30/2012 Elsevier Interactive Patient Education [employer].

## 2014-11-18 NOTE — Progress Notes (Signed)
   Subjective:    Patient ID: Alyssa Rangel, female    DOB: [birth year], 49 y.o.   MRN: [id number]  HPI Subjective:  Alyssa Rangel is a 49 y.o. female with hypertension follow up.   Hypertension ROS: patient does not perform home BP monitoring, no TIA's, no chest pain on exertion, no dyspnea on exertion, noting swelling of ankles, no orthostatic dizziness or lightheadedness, no palpitations and no intermittent claudication symptoms. Patient was found to be hypertensive at her last visit to the clinic on 10/25 when her BP was 152/78. She has come to have her blood pressure re-checked. She is not currently taking any antihypertensive medications.  New concerns: swelling in her calves and ankles. Patient used to have swelling a couple years ago and was prescribed Lasix which seemed to resolve the problem. The swelling is worse at night after she works all day on her feet. Initial BP today was 155/120, was then 142/84 when rechecked.    Review of Systems  Constitutional: Negative for fever, appetite change and fatigue.  Eyes: Negative for visual disturbance.  Respiratory: Negative for chest tightness, shortness of breath and wheezing.   Cardiovascular: Positive for leg swelling.  Gastrointestinal: Negative for nausea, vomiting, abdominal pain, diarrhea and constipation.  Endocrine: Positive for polyuria. Negative for polydipsia and polyphagia.  Skin: Negative for color change.      Objective:   Physical Exam  Constitutional: She appears well-developed and well-nourished.  Obese  Cardiovascular: Normal rate, regular rhythm, normal heart sounds and intact distal pulses.   No murmur heard. Trace edema noted in bilateral ankles  Pulmonary/Chest: Effort normal and breath sounds normal. She has no wheezes.  Skin: Skin is warm and dry. No rash noted. No erythema.   Objective:  BP 142/84 mmHg  Pulse 106  Temp(Src) 98 F (36.7 C) (Oral)  Ht 5\' 4"  (1.626 m)  Wt 258 lb (117.028  kg)  BMI 44.26 kg/m2   Lab review: labs reviewed, glycosylated hemoglobin abnormal at 6.4    Assessment & Plan:  Essential hypertension - Borderline. Initial BP today was 155/120, was then 142/84 when rechecked - Recommended sodium restriction. Was given handout on sodium restricted diet. Patient also referred to Dr. Jenne Campus (nutritionist). She will need education on better food options for borderline hypertension and prediabetes.  - Patient referred to web site to purchase compression stockings to help with ankle edema - Follow up: 1 month to re-check blood pressure and possibly start antihypertensive medication. Will also obtain more thorough general history and physical exam

## 2014-11-18 NOTE — Assessment & Plan Note (Addendum)
-   Borderline. Initial BP today was 155/120, was then 142/84 when rechecked - Recommended sodium restriction. Was given handout on sodium restricted diet. Patient also referred to Dr. Jenne Campus (nutritionist). She will need education on better food options for borderline hypertension and prediabetes.  - Patient referred to web site to purchase compression stockings to help with ankle edema - Follow up: 1 month to re-check blood pressure and possibly start antihypertensive medication. Will also obtain more thorough general history and physical exam

## 2015-01-05 ENCOUNTER — Ambulatory Visit: Payer: Self-pay | Admitting: Family Medicine

## 2015-01-26 ENCOUNTER — Ambulatory Visit (INDEPENDENT_AMBULATORY_CARE_PROVIDER_SITE_OTHER): Payer: Self-pay | Admitting: Family Medicine

## 2015-01-26 ENCOUNTER — Encounter: Payer: Self-pay | Admitting: Family Medicine

## 2015-01-26 VITALS — BP 136/70 | HR 91 | Temp 98.2°F | Ht 64.0 in | Wt 250.5 lb

## 2015-01-26 DIAGNOSIS — I10 Essential (primary) hypertension: Secondary | ICD-10-CM

## 2015-01-26 DIAGNOSIS — R7303 Prediabetes: Secondary | ICD-10-CM

## 2015-01-26 MED ORDER — METFORMIN HCL 500 MG PO TABS: 250.0000 mg | ORAL_TABLET | Freq: Every day | ORAL | Status: DC

## 2015-01-26 NOTE — Progress Notes (Signed)
Subjective:    Patient ID: Alyssa Rangel , female   DOB: Nov 15, 1965 , 50 y.o..   MRN: [id number]  HPI  Alyssa Rangel is here for follow up.  Blood Pressure:  At last visit, patient had borderline high blood pressure. She was told to decrease her sodium intake and modify her diet for better portion control. Patient states that she has been eating better and the swelling in her legs has decreased. She has been wearing compression stockings which have greatly helped with some ankle swelling she was experiencing.   Pre-Diabetes:  Patient's last A1C was 6.4 at last visit. We discussed starting a low dose Metformin or trying diet modification. Patient tried diet modification which has helped her lose about 8 pounds. She has been eating less "sweets" and trying smaller meal portions.    Review of Systems: Per HPI. All other systems reviewed and are negative.  Past Medical History: Patient Active Problem List   Diagnosis Date Noted  . Obesity   . Primary osteoarthritis of both hips   . Essential hypertension   . OA (osteoarthritis) of hip 10/09/2012  . Menorrhagia 10/30/2011  . 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 Hypercholesterolemia   . Rheumatoid arthritis(714.0)     Medications: reviewed and updated Current Outpatient Prescriptions  Medication Sig Dispense Refill  . albuterol (PROVENTIL HFA;VENTOLIN HFA) 108 (90 BASE) MCG/ACT inhaler Inhale 1-2 puffs into the lungs every 6 (six) hours as needed for wheezing or shortness of breath. 1 Inhaler 0  . triamcinolone ointment (KENALOG) 0.5 % Apply topically 2 (two) times daily. Please use for the next week, and then only as needed 30 g 0  . metFORMIN (GLUCOPHAGE) 500 MG tablet Take 0.5 tablets (250 mg total) by mouth daily with breakfast. 30 tablet 3   No current facility-administered medications for this visit.    Social History:   reports that she quit  smoking about 3 years ago. Her smoking use included Cigarettes. She has a 25 pack-year smoking history. She has never used smokeless tobacco.    Objective:   BP 136/70 mmHg  Pulse 91  Temp(Src) 98.2 F (36.8 C) (Oral)  Ht 5\' 4"  (1.626 m)  Wt 250 lb 8 oz (113.626 kg)  BMI 42.98 kg/m2  LMP  Physical Exam  Gen: NAD, alert, cooperative with exam, well-appearing CV: RRR, good S1/S2, no murmur, no edema, capillary refill brisk  Resp: CTABL, no wheezes, non-labored Abd: SNTND, BS present, no guarding or organomegaly Skin: no rashes, normal turgor  Neuro: no gross deficits.  Psych: good insight, alert and oriented    Assessment & Plan:  Prediabetes Last A1C was 6.4 on 10/30/14. Patient has been trying diet modification. Successfully lost 8 pounds since October. Continues to complain of polyuria and polydipsia. Has significant family history of type 2 diabetes. Is concerned about doing further work up today as her health insurance does not kick in until February.  - Will start 250 mg Metformin daily to assist in lowering A1C - Check A1C at next visit in February when patient has insurance  Essential hypertension Pt has had excellent compliance in lowering sodium and calorie intake. BP normal today at 136/70. Suspect patient may have "white coat syndrome". She is very responsive to counseling and highly motivated to control her blood pressure.  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

## 2015-01-26 NOTE — Patient Instructions (Signed)
Thank you for coming in today, it was so nice to see you!  I am so proud of you for losing weight and keeping to a low salt diet! Your blood pressure looks good. Keep doing what you're doing.   Your increased urination may be due to some high sugar in your blood. I'd like to start you on a low dose medicine called Metformin. I will send this prescription to your pharmacy, you can take this with food because sometimes it causes your stomach to be upset.   I would like to see you back in 1-2 months to do a full physical exam.   If you have any questions or concerns, please do not hesitate to call the office at [phone].  Sincerely,  Smitty Cords, MD

## 2015-01-27 DIAGNOSIS — R7303 Prediabetes: Secondary | ICD-10-CM | POA: Insufficient documentation

## 2015-01-27 DIAGNOSIS — E119 Type 2 diabetes mellitus without complications: Secondary | ICD-10-CM | POA: Insufficient documentation

## 2015-01-27 NOTE — Assessment & Plan Note (Addendum)
Pt has had excellent compliance in lowering sodium and calorie intake. BP normal today at 136/70. Suspect patient may have "white coat syndrome". She is very responsive to counseling and highly motivated to control her blood pressure.  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

## 2015-01-27 NOTE — Assessment & Plan Note (Signed)
Last A1C was 6.4 on 10/30/14. Patient has been trying diet modification. Successfully lost 8 pounds since October. Continues to complain of polyuria and polydipsia. Has significant family history of type 2 diabetes. Is concerned about doing further work up today as her health insurance does not kick in until February.  - Will start 250 mg Metformin daily to assist in lowering A1C - Check A1C at next visit in February when patient has insurance

## 2015-02-19 ENCOUNTER — Ambulatory Visit: Payer: Self-pay | Admitting: Family Medicine

## 2015-03-22 ENCOUNTER — Ambulatory Visit: Payer: Self-pay | Admitting: Family Medicine

## 2016-05-09 ENCOUNTER — Encounter (HOSPITAL_COMMUNITY): Payer: Self-pay | Admitting: Emergency Medicine

## 2016-05-09 ENCOUNTER — Inpatient Hospital Stay (HOSPITAL_COMMUNITY)
Admission: EM | Admit: 2016-05-09 | Discharge: 2016-05-12 | DRG: 603 | Disposition: A | Discharge status: Home / Self Care | Payer: Self-pay | Attending: Family Medicine | Admitting: Family Medicine

## 2016-05-09 ENCOUNTER — Emergency Department (HOSPITAL_COMMUNITY): Payer: Self-pay

## 2016-05-09 DIAGNOSIS — M7989 Other specified soft tissue disorders: Secondary | ICD-10-CM

## 2016-05-09 DIAGNOSIS — Z7984 Long term (current) use of oral hypoglycemic drugs: Secondary | ICD-10-CM

## 2016-05-09 DIAGNOSIS — L0390 Cellulitis, unspecified: Secondary | ICD-10-CM

## 2016-05-09 DIAGNOSIS — Z6841 Body Mass Index (BMI) 40.0 and over, adult: Secondary | ICD-10-CM

## 2016-05-09 DIAGNOSIS — R7303 Prediabetes: Secondary | ICD-10-CM | POA: Diagnosis present

## 2016-05-09 DIAGNOSIS — E7800 Pure hypercholesterolemia, unspecified: Secondary | ICD-10-CM | POA: Diagnosis present

## 2016-05-09 DIAGNOSIS — Z87891 Personal history of nicotine dependence: Secondary | ICD-10-CM

## 2016-05-09 DIAGNOSIS — L03116 Cellulitis of left lower limb: Principal | ICD-10-CM

## 2016-05-09 DIAGNOSIS — M2540 Effusion, unspecified joint: Secondary | ICD-10-CM

## 2016-05-09 DIAGNOSIS — H9190 Unspecified hearing loss, unspecified ear: Secondary | ICD-10-CM | POA: Diagnosis present

## 2016-05-09 DIAGNOSIS — Z96641 Presence of right artificial hip joint: Secondary | ICD-10-CM | POA: Diagnosis present

## 2016-05-09 DIAGNOSIS — Z833 Family history of diabetes mellitus: Secondary | ICD-10-CM

## 2016-05-09 DIAGNOSIS — M25471 Effusion, right ankle: Secondary | ICD-10-CM

## 2016-05-09 DIAGNOSIS — M1612 Unilateral primary osteoarthritis, left hip: Secondary | ICD-10-CM | POA: Diagnosis present

## 2016-05-09 DIAGNOSIS — L409 Psoriasis, unspecified: Secondary | ICD-10-CM | POA: Diagnosis present

## 2016-05-09 DIAGNOSIS — E785 Hyperlipidemia, unspecified: Secondary | ICD-10-CM | POA: Diagnosis present

## 2016-05-09 DIAGNOSIS — M25472 Effusion, left ankle: Secondary | ICD-10-CM

## 2016-05-09 DIAGNOSIS — M79605 Pain in left leg: Secondary | ICD-10-CM

## 2016-05-09 LAB — COMPREHENSIVE METABOLIC PANEL
ALT: 17 U/L (ref 14–54)
AST: 13 U/L — AB (ref 15–41)
Albumin: 3.2 g/dL — ABNORMAL LOW (ref 3.5–5.0)
Alkaline Phosphatase: 65 U/L (ref 38–126)
Anion gap: 8 (ref 5–15)
BUN: 9 mg/dL (ref 6–20)
CHLORIDE: 106 mmol/L (ref 101–111)
CO2: 24 mmol/L (ref 22–32)
Calcium: 8.6 mg/dL — ABNORMAL LOW (ref 8.9–10.3)
Creatinine, Ser: 0.64 mg/dL (ref 0.44–1.00)
GFR calc Af Amer: >60 mL/min (ref >60)
GLUCOSE: 107 mg/dL — AB (ref 65–99)
Potassium: 3.6 mmol/L (ref 3.5–5.1)
Sodium: 138 mmol/L (ref 135–145)
Total Bilirubin: 0.6 mg/dL (ref 0.3–1.2)
Total Protein: 7.4 g/dL (ref 6.5–8.1)

## 2016-05-09 LAB — CBC WITH DIFFERENTIAL/PLATELET
HCT: 36.2 % (ref 36.0–46.0)
Hemoglobin: 11.6 g/dL — ABNORMAL LOW (ref 12.0–15.0)
MCH: 26.9 pg (ref 26.0–34.0)
MCHC: 32 g/dL (ref 30.0–36.0)
MCV: 83.8 fL (ref 78.0–100.0)
PLATELETS: 288 10*3/uL (ref 150–400)
RBC: 4.32 MIL/uL (ref 3.87–5.11)
RDW: 14.1 % (ref 11.5–15.5)
WBC: 11.9 10*3/uL — AB (ref 4.0–10.5)

## 2016-05-09 LAB — SEDIMENTATION RATE: SED RATE: 56 mm/h — AB (ref 0–22)

## 2016-05-09 LAB — I-STAT CG4 LACTIC ACID, ED: LACTIC ACID, VENOUS: 0.72 mmol/L (ref 0.5–1.9)

## 2016-05-09 MED ORDER — MORPHINE SULFATE (PF) 4 MG/ML IV SOLN
4.0000 mg | Freq: Once | INTRAVENOUS | Status: AC
  Administered 2016-05-09: 4 mg via INTRAVENOUS
  Filled 2016-05-09: qty 1

## 2016-05-09 MED ORDER — CEFAZOLIN SODIUM-DEXTROSE 1-4 GM/50ML-% IV SOLN
1.0000 g | Freq: Once | INTRAVENOUS | Status: AC
  Administered 2016-05-09: 1 g via INTRAVENOUS
  Filled 2016-05-09: qty 50

## 2016-05-09 NOTE — ED Triage Notes (Signed)
Pt sts redness and swelling to left leg that started as itchy rash and now has pain and swelling with increased redness

## 2016-05-09 NOTE — ED Notes (Signed)
Patient transported to X-ray 

## 2016-05-09 NOTE — H&P (Signed)
[facility] Admission History and Physical Service Pager: [phone]  Patient name: Alyssa Rangel Medical record number: [id number] Date of birth: 1965-08-01 Age: 51 y.o. Gender: female  Primary Care Provider: Carlyle Dolly, MD Consultants: None Code Status: Full   Chief Complaint: Pain in left leg   Assessment and Plan: Alyssa Rangel is a 51 y.o. female presenting with cellulitis vs necrotizing fasciitis. PMH is significant for  HTN, Obesity, Pre-DM, RA?  #Leg swelling and erthyema: Sudden onset and progression of possible infection. Patient went from no rash or symptoms to swollen left leg with rash across the lower left extremity within 24 hours. VSS. Slight leukocytosis noted at 11.9. Patient has been afebrile. Examination of lower extremity erythematous, swollen, significantly warm to the touch, most concerning for infection, given the sudden onset more concerning for NF. Other possible differential includes a clot which is less likely with rash; however given sudden onset swelling, pitting edema, even less likely some type of vasculitis given autoimmune history. Until DVT can be appropriately ruled out will treat. X-ray obtained showing soft tissue swelling. ESR 56, lactic acid 0.79. - Will admit to stepdown, attending Dr. McDiarmid - Obtain CT tib-fib with contrast, to try to determine if Alyssa Rangel fasc is playing a role - Received Ancief in the broadened to including Zosyn, vancomycin, clindamycin - Will treat for possible DVT as patient's well's criteria is about 3 though less likely -start heparin VTE treatment - will obtain Doppler tomorrow to determine if clot is present - Add on a d-dimer - UDS to check for cocaine induced vasculitis   # HTN, blood pressure elevated on admission, improved over time - Not on any anti-HTN meds - Monitor  # RA: Noted on her problem list, however normal RA factor and CCP. 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 RA is patient reported per initial notes -Not currently followed by rheumatology, not on any DMARD  # Pre-Diabetes, Morbid Obesity, HLD Last A1c 6.4 - Per review outpatient notes planning on making changes to diet and exercise  # ECZEMA / PSORIASIS noted to have psoriatic plaques on her feet, states she has these off and on- most recent plaques started about a week ago. Has used triamcinolone cream in the past, which has helped significantly: Did note a hypopigmentation of her right ankle possibly from too much steroid use - Consider triamcinolone ointment BID  FEN/GI: NPO Prophylaxis: VTE treatment   Disposition: Home   History of Present Illness:  Alyssa Rangel is a 51 y.o. female presenting with history of 24-hour progression of infection of the leg. Patient states that previous night she had no symptoms of pain, erythema, swelling in her left leg. Patient noted this morning when she woke up about a quarter size patchy area of erythema swelling on her middle of her front  leg. She states she then went to work and when she got home at 3 PM her leg was swollen completely there was an erythematous rash that had progressed through the most part of the her middle leg. She denies any fevers, she does note some chills however denies any rigors. She denies any recent medication use or drug use. No concern about bug bites. No recent trips into the words. No recent immobilization, no history of immunocompromise,  no treatment for rheumatoid arthritis, no cancer, no previous history of clot. Does note significant pain and some itching  associated with this rash.   Review Of Systems: Per HPI with the following additions:   Review of Systems  Constitutional: Positive for chills. Negative for fever.  HENT: Negative for ear pain and sinus pain.   Eyes: Negative for blurred vision and double vision.  Respiratory:  Negative for cough and shortness of breath.   Cardiovascular: Negative for chest pain and palpitations.  Gastrointestinal: Negative for abdominal pain, heartburn, nausea and vomiting.  Genitourinary: Negative for dysuria and urgency.  Musculoskeletal: Negative for myalgias and neck pain.  Skin: Positive for itching and rash.  Neurological: Negative for dizziness and headaches.    Patient Active Problem List   Diagnosis Date Noted  . Prediabetes 01/27/2015  . Obesity   . Primary osteoarthritis of both hips   . Essential hypertension   . OA (osteoarthritis) of hip 10/09/2012  . Menorrhagia 10/30/2011  . 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 Hypercholesterolemia   . Rheumatoid arthritis(714.0)     Past Medical History: Past Medical History:  Diagnosis Date  . Anemia   . Cholecystitis, acute 11/02/2012  . Hearing loss   . Hip pain    pt states no cartilage in right hip very painful  . History of body piercing    tongue ring  . Hypercholesterolemia    prior medication therapy  . Hypertension    due to diet pills in the past, never on meds; normotensive as of 7/14  . Polyarthralgia   . Rheumatoid arthritis(714.0)    patient reported history  . Routine gynecological examination    Dr. Everett Graff, [facility]  . 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 BILATERAL SALPINGECTOMY  02/06/2012   Procedure: BILATERAL SALPINGECTOMY;  Surgeon: Delice Lesch, MD;  Location: [facility];  Service: Gynecology;  Laterality: Bilateral;  partial bilateral salpingectomy  . CHOLECYSTECTOMY N/A 10/31/2012   Procedure: LAPAROSCOPIC CHOLECYSTECTOMY WITH INTRAOPERATIVE CHOLANGIOGRAM;  Surgeon: Edward Jolly, MD;  Location: [place];  Service: General;  Laterality: N/A;  . CYSTOSCOPY  02/06/2012   Procedure:  CYSTOSCOPY;  Surgeon: Delice Lesch, MD;  Location: [facility];  Service: Gynecology;  Laterality: N/A;  . DILITATION & CURRETTAGE/HYSTROSCOPY WITH NOVASURE ABLATION  02/06/2012   Procedure: DILATATION & CURETTAGE/HYSTEROSCOPY WITH NOVASURE ABLATION;  Surgeon: Delice Lesch, MD;  Location: [facility];  Service: Gynecology;  Laterality: N/A;   novasure  . LAPAROSCOPY  02/06/2012   Procedure: LAPAROSCOPY OPERATIVE;  Surgeon: Delice Lesch, MD;  Location: [facility];  Service: Gynecology;  Laterality: N/A;  . OVARIAN CYST REMOVAL  02/06/2012   Procedure: OVARIAN CYSTECTOMY;  Surgeon: Delice Lesch, MD;  Location: [facility];  Service: Gynecology;  Laterality: Bilateral;  . TOTAL HIP ARTHROPLASTY Right 10/09/2012   Procedure: RIGHT TOTAL HIP ARTHROPLASTY;  Surgeon: Gearlean Alf, MD;  Location: [facility];  Service: Orthopedics;  Laterality: Right;    Social History: Social History  Substance Use Topics  . Smoking status: Former Smoker    Packs/day: 1.00    Years: 25.00    Types: Cigarettes    Quit date: 10/02/2011  . Smokeless tobacco: Never Used  . Alcohol use 0.6 oz/week    1 Glasses of wine per week     Comment: occ.   Additional social history: Please also refer to relevant sections of EMR.  Family History: Family History  Problem Relation Age of Onset  . Heart disease Maternal Grandmother   . Diabetes Maternal Grandmother   . Hypertension Maternal Grandmother   . Arthritis Maternal Grandmother   . Diabetes Mother     insulin dependent  . Hypertension Mother   . Other Father     unknown  . Multiple sclerosis Sister   . Cancer Neg Hx   . Stroke Neg Hx     Allergies and Medications: No Known Allergies No current facility-administered medications on file prior to encounter.    Current Outpatient Prescriptions on File Prior to Encounter  Medication Sig Dispense Refill  . albuterol (PROVENTIL HFA;VENTOLIN HFA) 108 (90 BASE) MCG/ACT inhaler Inhale 1-2 puffs into the lungs every 6  (six) hours as needed for wheezing or shortness of breath. 1 Inhaler 0  . metFORMIN (GLUCOPHAGE) 500 MG tablet Take 0.5 tablets (250 mg total) by mouth daily with breakfast. 30 tablet 3  . triamcinolone ointment (KENALOG) 0.5 % Apply topically 2 (two) times daily. Please use for the next week, and then only as needed 30 g 0    Objective: BP 137/74   Pulse 83   Temp 99.3 F (37.4 C) (Oral)   Resp 20   Ht _0  (1.626 m)   Wt 258 lb (117 kg)   SpO2 99%   BMI 44.29 kg/m  Exam: General: Well-appearing middle-aged female, no acute distress Eyes: PERRL ENTM: Moist mucous membranes Neck: No lymphadenopathy Cardiovascular: Regular rate and rhythm, no murmurs no gallops Respiratory: CTA B no wheezing, no rhonchi, no crackles Gastrointestinal: Bowel sounds present, no TTP, no distention MSK: Swollen left leg with 1+ pitting edema, swelling to knee,midcalf tenderness tenderness and popliteal fossa, Pain with dorsiflexion, Derm: Macular rash, erythematous, areas of confluence, warm to the touch Neuro: Normal strength in upper and lower extremities  Psych: alert and orientated 3, intact thought process  Labs and Imaging: CBC BMET   Recent Labs Lab 05/09/16 2131  WBC 11.9*  HGB 11.6*  HCT 36.2  PLT 288    Recent Labs Lab 05/09/16 2131  NA 138  K 3.6  CL 106  CO2 24  BUN 9  CREATININE 0.64  GLUCOSE 107*  CALCIUM 8.6*     Dg Tibia/fibula Left  Result Date: 05/09/2016 CLINICAL DATA:  Cellulitis pain and redness to the midshaft of tibia EXAM: LEFT TIBIA AND FIBULA - 2 VIEW COMPARISON:  None. FINDINGS: No fracture or malalignment. Patellofemoral degenerative changes. Bony spur of the calcaneus. Moderate generalized edema.  No soft tissue gas. IMPRESSION: Moderate diffuse soft tissue edema.  No acute osseous abnormality. Electronically Signed   By: Donavan Foil M.D.   On: 05/09/2016 22:41   ESR 56   Asiyah Cletis Media, MD 05/09/2016, 11:17 PM PGY-2, [facility] pager: [phone], text pages welcome

## 2016-05-09 NOTE — ED Provider Notes (Signed)
[facility] DEPT Provider Note   CSN: [id number] Arrival date & time: 05/09/16  1750     History   Chief Complaint Chief Complaint  Patient presents with  . Leg Pain  . Leg Swelling    HPI Alyssa Rangel is a 51 y.o. female with a hx of Prediabetes, anemia, hypertension, rheumatoid arthritis presents to the Emergency Department complaining of gradual, persistent, progressively worsening left leg pain, rash and swelling onset this morning after waking. Patient reports she has had a strange rash on her bilateral feet for approximately one week but this morning she awoke with a small red spot to the left lower leg on the anterior portion. She reports throughout the day the rash has extended up her leg and circumferential around her leg. She reports severe 10/10 pain in the leg with associated swelling and redness. She reports chills at home but no measured fever. She does not check her blood sugar at home. She denies recent travel, estrogen usage or history of DVT.  She denies falls or wounds to the lower leg prior to the onset of her bilateral foot rash 1 week ago. Patient reports severe itching. Nothing makes symptoms better or worse. No treatments prior to arrival.       The history is provided by the patient and medical records. No language interpreter was used.    Past Medical History:  Diagnosis Date  . Anemia   . Cholecystitis, acute 11/02/2012  . Hearing loss   . Hip pain    pt states no cartilage in right hip very painful  . History of body piercing    tongue ring  . Hypercholesterolemia    prior medication therapy  . Hypertension    due to diet pills in the past, never on meds; normotensive as of 7/14  . Polyarthralgia   . Rheumatoid arthritis(714.0)    patient reported history  . Routine gynecological examination    Dr. Everett Graff, [facility]  . 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 Prediabetes 01/27/2015  . Obesity   . Primary osteoarthritis of both hips   . Essential hypertension   . OA (osteoarthritis) of hip 10/09/2012  . Menorrhagia 10/30/2011  . 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 Hypercholesterolemia   . Rheumatoid arthritis(714.0)     Past Surgical History:  Procedure Laterality Date  . BILATERAL SALPINGECTOMY  02/06/2012   Procedure: BILATERAL SALPINGECTOMY;  Surgeon: Delice Lesch, MD;  Location: [facility];  Service: Gynecology;  Laterality: Bilateral;  partial bilateral salpingectomy  . CHOLECYSTECTOMY N/A 10/31/2012   Procedure: LAPAROSCOPIC CHOLECYSTECTOMY WITH INTRAOPERATIVE CHOLANGIOGRAM;  Surgeon: Edward Jolly, MD;  Location: [facility] [place];  Service: General;  Laterality: N/A;  . CYSTOSCOPY  02/06/2012   Procedure: CYSTOSCOPY;  Surgeon: Delice Lesch, MD;  Location: [facility];  Service: Gynecology;  Laterality: N/A;  . DILITATION & CURRETTAGE/HYSTROSCOPY WITH NOVASURE ABLATION  02/06/2012   Procedure: DILATATION & CURETTAGE/HYSTEROSCOPY WITH NOVASURE ABLATION;  Surgeon: Delice Lesch, MD;  Location: [facility];  Service: Gynecology;  Laterality: N/A;   novasure  . LAPAROSCOPY  02/06/2012   Procedure: LAPAROSCOPY OPERATIVE;  Surgeon: Delice Lesch, MD;  Location: [facility];  Service: Gynecology;  Laterality: N/A;  . OVARIAN CYST REMOVAL  02/06/2012   Procedure: OVARIAN CYSTECTOMY;  Surgeon: Harvie Bridge  Mancel Bale, MD;  Location: [facility];  Service: Gynecology;  Laterality: Bilateral;  . TOTAL HIP ARTHROPLASTY Right 10/09/2012   Procedure: RIGHT TOTAL HIP ARTHROPLASTY;  Surgeon: Gearlean Alf, MD;  Location: [facility];  Service: Orthopedics;  Laterality: Right;    OB History    Gravida Para Term Preterm AB Living   0             SAB TAB Ectopic Multiple Live Births                   Home Medications    Prior to Admission medications   Medication Sig Start Date  End Date Taking? Authorizing Provider  albuterol (PROVENTIL HFA;VENTOLIN HFA) 108 (90 BASE) MCG/ACT inhaler Inhale 1-2 puffs into the lungs every 6 (six) hours as needed for wheezing or shortness of breath. 10/29/14   Samantha Tripp Dowless, PA-C  metFORMIN (GLUCOPHAGE) 500 MG tablet Take 0.5 tablets (250 mg total) by mouth daily with breakfast. 01/26/15   Carlyle Dolly, MD  triamcinolone ointment (KENALOG) 0.5 % Apply topically 2 (two) times daily. Please use for the next week, and then only as needed 10/30/14   Asiyah Cletis Media, MD    Family History Family History  Problem Relation Age of Onset  . Heart disease Maternal Grandmother   . Diabetes Maternal Grandmother   . Hypertension Maternal Grandmother   . Arthritis Maternal Grandmother   . Diabetes Mother     insulin dependent  . Hypertension Mother   . Other Father     unknown  . Multiple sclerosis Sister   . Cancer Neg Hx   . Stroke Neg Hx     Social History Social History  Substance Use Topics  . Smoking status: Former Smoker    Packs/day: 1.00    Years: 25.00    Types: Cigarettes    Quit date: 10/02/2011  . Smokeless tobacco: Never Used  . Alcohol use 0.6 oz/week    1 Glasses of wine per week     Comment: occ.     Allergies   Patient has no known allergies.   Review of Systems Review of Systems  Constitutional: Positive for chills. Negative for fever.  Musculoskeletal: Positive for arthralgias and myalgias.  Skin: Positive for color change and rash.  All other systems reviewed and are negative.    Physical Exam Updated Vital Signs BP (!) 154/84 (BP Location: Right Arm)   Pulse 93   Temp 99.3 F (37.4 C) (Oral)   Resp 19   Ht 5\' 4"  (1.626 m)   Wt 117 kg   SpO2 97%   BMI 44.29 kg/m   Physical Exam  Constitutional: She appears well-developed and well-nourished. No distress.  Awake, alert, nontoxic appearance  HENT:  Head: Normocephalic and atraumatic.  Mouth/Throat: Oropharynx is clear  and moist. No oropharyngeal exudate.  Eyes: Conjunctivae are normal. No scleral icterus.  Neck: Normal range of motion. Neck supple.  Cardiovascular: Normal rate, regular rhythm and intact distal pulses.   Pulmonary/Chest: Effort normal and breath sounds normal. No respiratory distress. She has no wheezes.  Equal chest expansion  Abdominal: Soft. Bowel sounds are normal. She exhibits no mass. There is no tenderness. There is no rebound and no guarding.  Musculoskeletal: Normal range of motion. She exhibits no edema.  Swelling of the left lower extremity from the proximal tibia to the mid forefoot.  Neurological: She is alert.  Speech is clear and goal oriented Moves extremities without ataxia  Skin:  Skin is warm and dry. Rash noted. She is not diaphoretic. There is erythema.  Circumferential, Macular, nonblanching rash to the left lower extremity with severe pain to palpation and associated erythema  Psychiatric: She has a normal mood and affect.  Nursing note and vitals reviewed.    ED Treatments / Results  Labs (all labs ordered are listed, but only abnormal results are displayed) Labs Reviewed  COMPREHENSIVE METABOLIC PANEL - Abnormal; Notable for the following:       Result Value   Glucose, Bld 107 (*)    Calcium 8.6 (*)    Albumin 3.2 (*)    AST 13 (*)    All other components within normal limits  CBC WITH DIFFERENTIAL/PLATELET - Abnormal; Notable for the following:    WBC 11.9 (*)    Hemoglobin 11.6 (*)    All other components within normal limits  SEDIMENTATION RATE - Abnormal; Notable for the following:    Sed Rate 56 (*)    All other components within normal limits  CULTURE, BLOOD (ROUTINE X 2)  CULTURE, BLOOD (ROUTINE X 2)  MRSA PCR SCREENING  I-STAT CG4 LACTIC ACID, ED    Radiology Dg Tibia/fibula Left  Result Date: 05/09/2016 CLINICAL DATA:  Cellulitis pain and redness to the midshaft of tibia EXAM: LEFT TIBIA AND FIBULA - 2 VIEW COMPARISON:  None. FINDINGS:  No fracture or malalignment. Patellofemoral degenerative changes. Bony spur of the calcaneus. Moderate generalized edema.  No soft tissue gas. IMPRESSION: Moderate diffuse soft tissue edema.  No acute osseous abnormality. Electronically Signed   By: Donavan Foil M.D.   On: 05/09/2016 22:41    Procedures Procedures (including critical care time)  Medications Ordered in ED Medications  morphine 4 MG/ML injection 4 mg (4 mg Intravenous Given 05/09/16 2149)  ceFAZolin (ANCEF) IVPB 1 g/50 mL premix (0 g Intravenous Stopped 05/09/16 2256)  morphine 4 MG/ML injection 4 mg (4 mg Intravenous Given 05/09/16 2301)     Initial Impression / Assessment and Plan / ED Course  I have reviewed the triage vital signs and the nursing notes.  Pertinent labs & imaging results that were available during my care of the patient were reviewed by me and considered in my medical decision making (see chart for details).  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 BP: 137/74 [HM]    Clinical Course User Index [HM] Abigail Butts, PA-C    Patient presents with rash, severe pain and erythema of the upper extremity concerning for cellulitis. No known history of MRSA. Concern for possible necrotizing fasciitis due to the speed at which this has progressed.  Patient vital signs are within normal limits at this time. She is afebrile. Labs and x-rays pending. Will control pain.  11:28 PM Patient with improved pain. No free air or gas in the tissue on x-ray. Elevated sedimentation rate. Patient will be admitted for IV antibiotics.  Final Clinical Impressions(s) / ED Diagnoses   Final diagnoses:  Cellulitis of left lower extremity  Left leg pain  Leg swelling    New Prescriptions New  Prescriptions   No medications on file     Abigail Butts, PA-C 05/09/16 [place], MD 05/10/16 1105

## 2016-05-09 NOTE — ED Notes (Signed)
Pts leg marked with skin marker around red area.

## 2016-05-09 NOTE — ED Notes (Signed)
Pt returned from xray

## 2016-05-10 ENCOUNTER — Inpatient Hospital Stay (HOSPITAL_COMMUNITY): Payer: Self-pay

## 2016-05-10 ENCOUNTER — Encounter (HOSPITAL_COMMUNITY): Payer: Self-pay

## 2016-05-10 ENCOUNTER — Encounter (HOSPITAL_COMMUNITY): Payer: Self-pay | Admitting: *Deleted

## 2016-05-10 ENCOUNTER — Emergency Department (HOSPITAL_COMMUNITY): Payer: Self-pay

## 2016-05-10 DIAGNOSIS — L03116 Cellulitis of left lower limb: Principal | ICD-10-CM

## 2016-05-10 DIAGNOSIS — M7989 Other specified soft tissue disorders: Secondary | ICD-10-CM

## 2016-05-10 LAB — RAPID URINE DRUG SCREEN, HOSP PERFORMED
Amphetamines: NOT DETECTED
BARBITURATES: NOT DETECTED
BENZODIAZEPINES: NOT DETECTED
Cocaine: NOT DETECTED
Opiates: NOT DETECTED
Tetrahydrocannabinol: POSITIVE — AB

## 2016-05-10 LAB — CBC
HEMATOCRIT: 34.4 % — AB (ref 36.0–46.0)
Hemoglobin: 10.9 g/dL — ABNORMAL LOW (ref 12.0–15.0)
MCH: 26.5 pg (ref 26.0–34.0)
MCHC: 31.7 g/dL (ref 30.0–36.0)
MCV: 83.7 fL (ref 78.0–100.0)
PLATELETS: 266 10*3/uL (ref 150–400)
RBC: 4.11 MIL/uL (ref 3.87–5.11)
RDW: 13.8 % (ref 11.5–15.5)
WBC: 10.6 10*3/uL — AB (ref 4.0–10.5)

## 2016-05-10 LAB — HEPARIN LEVEL (UNFRACTIONATED)
HEPARIN UNFRACTIONATED: 0.12 [IU]/mL — AB (ref 0.30–0.70)
Heparin Unfractionated: 0.12 IU/mL — ABNORMAL LOW (ref 0.30–0.70)

## 2016-05-10 LAB — BASIC METABOLIC PANEL
Anion gap: 8 (ref 5–15)
BUN: 7 mg/dL (ref 6–20)
CHLORIDE: 106 mmol/L (ref 101–111)
CO2: 24 mmol/L (ref 22–32)
CREATININE: 0.7 mg/dL (ref 0.44–1.00)
Calcium: 8.3 mg/dL — ABNORMAL LOW (ref 8.9–10.3)
GFR calc Af Amer: >60 mL/min (ref >60)
GFR calc non Af Amer: >60 mL/min (ref >60)
GLUCOSE: 106 mg/dL — AB (ref 65–99)
Potassium: 3.4 mmol/L — ABNORMAL LOW (ref 3.5–5.1)
Sodium: 138 mmol/L (ref 135–145)

## 2016-05-10 LAB — MRSA PCR SCREENING: MRSA by PCR: NEGATIVE

## 2016-05-10 LAB — D-DIMER, QUANTITATIVE: D-Dimer, Quant: 0.9 ug/mL-FEU — ABNORMAL HIGH (ref 0.00–0.50)

## 2016-05-10 LAB — HIV ANTIBODY (ROUTINE TESTING W REFLEX): HIV SCREEN 4TH GENERATION: NONREACTIVE

## 2016-05-10 MED ORDER — CLINDAMYCIN PHOSPHATE 600 MG/50ML IV SOLN
600.0000 mg | Freq: Three times a day (TID) | INTRAVENOUS | Status: DC
  Administered 2016-05-10 (×2): 600 mg via INTRAVENOUS
  Filled 2016-05-10 (×2): qty 50

## 2016-05-10 MED ORDER — ENOXAPARIN SODIUM 120 MG/0.8ML ~~LOC~~ SOLN: 120.0000 mg | Freq: Two times a day (BID) | SUBCUTANEOUS | Status: DC

## 2016-05-10 MED ORDER — ACETAMINOPHEN 325 MG PO TABS
650.0000 mg | ORAL_TABLET | Freq: Four times a day (QID) | ORAL | Status: DC | PRN
  Administered 2016-05-10 – 2016-05-12 (×4): 650 mg via ORAL
  Filled 2016-05-10 (×4): qty 2

## 2016-05-10 MED ORDER — IOPAMIDOL (ISOVUE-300) INJECTION 61%
INTRAVENOUS | Status: AC
  Administered 2016-05-10: 100 mL
  Filled 2016-05-10: qty 100

## 2016-05-10 MED ORDER — HEPARIN BOLUS VIA INFUSION
2500.0000 [IU] | Freq: Once | INTRAVENOUS | Status: AC
Start: 2016-05-10 — End: 2016-05-10
  Administered 2016-05-10: 2500 [IU] via INTRAVENOUS
  Filled 2016-05-10: qty 2500

## 2016-05-10 MED ORDER — HEPARIN (PORCINE) IN NACL 100-0.45 UNIT/ML-% IJ SOLN
2200.0000 [IU]/h | INTRAMUSCULAR | Status: DC
  Administered 2016-05-11: 2200 [IU]/h via INTRAVENOUS
  Filled 2016-05-10 (×3): qty 250

## 2016-05-10 MED ORDER — MORPHINE SULFATE (PF) 4 MG/ML IV SOLN
4.0000 mg | Freq: Once | INTRAVENOUS | Status: AC
  Administered 2016-05-10: 4 mg via INTRAVENOUS
  Filled 2016-05-10: qty 1

## 2016-05-10 MED ORDER — DOCUSATE SODIUM 100 MG PO CAPS: 100.0000 mg | ORAL_CAPSULE | Freq: Every day | ORAL | Status: DC | PRN

## 2016-05-10 MED ORDER — HEPARIN (PORCINE) IN NACL 100-0.45 UNIT/ML-% IJ SOLN
1650.0000 [IU]/h | INTRAMUSCULAR | Status: DC
  Administered 2016-05-10: 1650 [IU]/h via INTRAVENOUS
  Administered 2016-05-10: 1350 [IU]/h via INTRAVENOUS
  Filled 2016-05-10 (×2): qty 250

## 2016-05-10 MED ORDER — HEPARIN BOLUS VIA INFUSION
2000.0000 [IU] | Freq: Once | INTRAVENOUS | Status: AC
  Administered 2016-05-10: 2000 [IU] via INTRAVENOUS
  Filled 2016-05-10: qty 2000

## 2016-05-10 MED ORDER — TRIAMCINOLONE ACETONIDE 0.5 % EX OINT
TOPICAL_OINTMENT | Freq: Two times a day (BID) | CUTANEOUS | Status: DC
  Administered 2016-05-10 – 2016-05-12 (×4): via TOPICAL
  Filled 2016-05-10: qty 15

## 2016-05-10 MED ORDER — TRAMADOL HCL 50 MG PO TABS
50.0000 mg | ORAL_TABLET | Freq: Four times a day (QID) | ORAL | Status: DC | PRN
  Administered 2016-05-10 – 2016-05-12 (×7): 50 mg via ORAL
  Filled 2016-05-10 (×7): qty 1

## 2016-05-10 MED ORDER — MORPHINE SULFATE (PF) 4 MG/ML IV SOLN
1.0000 mg | INTRAVENOUS | Status: DC | PRN
  Administered 2016-05-10 (×2): 1 mg via INTRAVENOUS
  Filled 2016-05-10 (×2): qty 1

## 2016-05-10 MED ORDER — VANCOMYCIN HCL IN DEXTROSE 1-5 GM/200ML-% IV SOLN
1000.0000 mg | Freq: Three times a day (TID) | INTRAVENOUS | Status: DC
  Administered 2016-05-10 – 2016-05-11 (×4): 1000 mg via INTRAVENOUS
  Filled 2016-05-10 (×7): qty 200

## 2016-05-10 MED ORDER — PIPERACILLIN-TAZOBACTAM 3.375 G IVPB
3.3750 g | Freq: Three times a day (TID) | INTRAVENOUS | Status: DC
  Administered 2016-05-10 – 2016-05-11 (×5): 3.375 g via INTRAVENOUS
  Filled 2016-05-10 (×8): qty 50

## 2016-05-10 MED ORDER — ENOXAPARIN SODIUM 120 MG/0.8ML ~~LOC~~ SOLN
120.0000 mg | Freq: Once | SUBCUTANEOUS | Status: DC
  Filled 2016-05-10: qty 0.8

## 2016-05-10 MED ORDER — IBUPROFEN 600 MG PO TABS
600.0000 mg | ORAL_TABLET | Freq: Four times a day (QID) | ORAL | Status: DC | PRN
Start: 2016-05-10 — End: 2016-05-12
  Administered 2016-05-10 – 2016-05-12 (×7): 600 mg via ORAL
  Filled 2016-05-10 (×6): qty 1
  Filled 2016-05-10: qty 3
  Filled 2016-05-10: qty 1

## 2016-05-10 MED ORDER — HEPARIN BOLUS VIA INFUSION
4000.0000 [IU] | Freq: Once | INTRAVENOUS | Status: AC
  Administered 2016-05-10: 4000 [IU] via INTRAVENOUS
  Filled 2016-05-10: qty 4000

## 2016-05-10 MED ORDER — POLYETHYLENE GLYCOL 3350 17 G PO PACK: 17.0000 g | PACK | Freq: Every day | ORAL | Status: DC | PRN

## 2016-05-10 MED ORDER — VANCOMYCIN HCL 10 G IV SOLR
2000.0000 mg | Freq: Once | INTRAVENOUS | Status: AC
  Administered 2016-05-10: 2000 mg via INTRAVENOUS
  Filled 2016-05-10: qty 2000

## 2016-05-10 NOTE — Progress Notes (Addendum)
ANTICOAGULATION CONSULT NOTE - Initial Consult  Pharmacy Consult for Lovenox Indication: R/O DVT  No Known Allergies  Patient Measurements: Height: 5\' 4"  (162.6 cm) Weight: 258 lb (117 kg) IBW/kg (Calculated) : 54.7  Vital Signs: Temp: 99.3 F (37.4 C) (05/01 1803) Temp Source: Oral (05/01 1803) BP: 135/68 (05/02 0045) Pulse Rate: 79 (05/02 0045)  Labs:  Recent Labs  05/09/16 2131  HGB 11.6*  HCT 36.2  PLT 288  CREATININE 0.64    Estimated Creatinine Clearance: 103.4 mL/min (by C-G formula based on SCr of 0.64 mg/dL).   Medical History: Past Medical History:  Diagnosis Date  . Anemia   . Cholecystitis, acute 11/02/2012  . Hearing loss   . Hip pain    pt states no cartilage in right hip very painful  . History of body piercing    tongue ring  . Hypercholesterolemia    prior medication therapy  . Hypertension    due to diet pills in the past, never on meds; normotensive as of 7/14  . Polyarthralgia   . Rheumatoid arthritis(714.0)    patient reported history  . Routine gynecological examination    Dr. Everett Graff, [facility]  . 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    Current Outpatient Prescriptions on File Prior to Encounter  Medication Sig Dispense Refill  . albuterol (PROVENTIL HFA;VENTOLIN HFA) 108 (90 BASE) MCG/ACT inhaler Inhale 1-2 puffs into the lungs every 6 (six) hours as needed for wheezing or shortness of breath. 1 Inhaler 0     Assessment: 51 y.o. female with LLE swelling , possible DVT, for Lovenox Goal of Therapy:  Full anticoagulation with Lovenox Monitor platelets by anticoagulation protocol: Yes   Plan:  Lovenox 120 mg SQ q12h  Ignacio Lowder, [place] 05/10/2016,1:15 AM   Addendum: Lovenox changed to heparin.  Heparin 4000 units IV bolus, then start heparin 1350 units/hr  Check heparin level in  8 hours. Phillis Knack, PharmD, BCPS  05/10/2016 1:36 AM

## 2016-05-10 NOTE — Progress Notes (Signed)
ANTICOAGULATION CONSULT NOTE  Pharmacy Consult for heparin Indication: R/O DVT  No Known Allergies  Patient Measurements: Height: 5\' 4"  (162.6 cm) Weight: 258 lb 9.6 oz (117.3 kg) IBW/kg (Calculated) : 54.7  Heparin dosing weight:  83 kg  Vital Signs: Temp: 98.9 F (37.2 C) (05/02 1916) Temp Source: Oral (05/02 1916) BP: 155/86 (05/02 1916) Pulse Rate: 66 (05/02 1916)  Labs:  Recent Labs  05/09/16 2131 05/10/16 0315 05/10/16 0757 05/10/16 1840  HGB 11.6* 10.9*  --   --   HCT 36.2 34.4*  --   --   PLT 288 266  --   --   HEPARINUNFRC  --   --  0.12* 0.12*  CREATININE 0.64 0.70  --   --     Estimated Creatinine Clearance: 103.5 mL/min (by C-G formula based on SCr of 0.7 mg/dL).   Medical History: Past Medical History:  Diagnosis Date  . Anemia   . Cholecystitis, acute 11/02/2012  . Hearing loss   . Hip pain    pt states no cartilage in right hip very painful  . History of body piercing    tongue ring  . Hypercholesterolemia    prior medication therapy  . Hypertension    due to diet pills in the past, never on meds; normotensive as of 7/14  . Polyarthralgia   . Rheumatoid arthritis(714.0)    patient reported history  . Routine gynecological examination    Dr. Everett Graff, [facility]  . 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    Current Outpatient Prescriptions on File Prior to Encounter  Medication Sig Dispense Refill  . albuterol (PROVENTIL HFA;VENTOLIN HFA) 108 (90 BASE) MCG/ACT inhaler Inhale 1-2 puffs into the lungs every 6 (six) hours as needed for wheezing or shortness of breath. 1 Inhaler 0     Assessment: 51 y.o. female with LLE swelling , possible DVT, she continues on IV heparin.  2nd heparin level remains low = 0.12 despite a bolus and an increase in heparin rate to 1650 units/hr earlier today. No bleeding  issues noted.   Goal: Heparin level goal 0.3-0.7 Monitor platelets by anticoagulation protocol: Yes   Plan:  Give 2500 unit bolus Increase heparin to 1950 units/hr Recheck heparin level in 6 hours   Nicole Cella, RPh Clinical Pharmacist Pager: [phone] 05/10/2016 7:33 PM

## 2016-05-10 NOTE — Progress Notes (Addendum)
Dr. Cyndia Skeeters notified last [facility] 4/29; verbal orders received for PRN Miralax/Colace (see MAR); also notified MD of c/o itching to left lower leg/ankle.  Inquired about topical medication per pt request; MD stated he would look into request.  No further orders received at this time.  Of note, patchy redness to left lower leg/ankle receding outlined area. Left leg elevated on pillows.  Will continue to monitor.

## 2016-05-10 NOTE — Progress Notes (Signed)
Pharmacy Antibiotic Note  Alyssa Rangel is a 51 y.o. female admitted on 05/09/2016 with LLE cellulitis/necrotizing fasciitis .  Pharmacy has been consulted for Vancomycin and Zosyn dosing.  Plan: Vancomycin 2 g IV now, then 1 g IV q8h Zosyn 3.375 g IV q8h   Height: 5\' 4"  (162.6 cm) Weight: 258 lb (117 kg) IBW/kg (Calculated) : 54.7  Temp (24hrs), Avg:99.3 F (37.4 C), Min:99.3 F (37.4 C), Max:99.3 F (37.4 C)   Recent Labs Lab 05/09/16 2131 05/09/16 2146  WBC 11.9*  --   CREATININE 0.64  --   LATICACIDVEN  --  0.72    Estimated Creatinine Clearance: 103.4 mL/min (by C-G formula based on SCr of 0.64 mg/dL).    No Known Allergies   Caryl Pina 05/10/2016 12:47 AM

## 2016-05-10 NOTE — Progress Notes (Signed)
Patient c/o pain. Motrin given at [place] with no relief. On call physician paged at around 2030. Patient also c/o itching of scaley patches on bilateral toes, requested cream for relief. No orders given at this time. Will continue to monitor. Jimmie Molly, RN

## 2016-05-10 NOTE — Progress Notes (Signed)
ANTICOAGULATION CONSULT NOTE  Pharmacy Consult for heparin Indication: R/O DVT  No Known Allergies  Patient Measurements: Height: 5\' 4"  (162.6 cm) Weight: 258 lb 9.6 oz (117.3 kg) IBW/kg (Calculated) : 54.7  Vital Signs: Temp: 98.1 F (36.7 C) (05/02 0817) Temp Source: Oral (05/02 0817) BP: 99/65 (05/02 0817) Pulse Rate: 83 (05/02 0817)  Labs:  Recent Labs  05/09/16 2131 05/10/16 0315 05/10/16 0757  HGB 11.6* 10.9*  --   HCT 36.2 34.4*  --   PLT 288 266  --   HEPARINUNFRC  --   --  0.12*  CREATININE 0.64 0.70  --     Estimated Creatinine Clearance: 103.5 mL/min (by C-G formula based on SCr of 0.7 mg/dL).   Medical History: Past Medical History:  Diagnosis Date  . Anemia   . Cholecystitis, acute 11/02/2012  . Hearing loss   . Hip pain    pt states no cartilage in right hip very painful  . History of body piercing    tongue ring  . Hypercholesterolemia    prior medication therapy  . Hypertension    due to diet pills in the past, never on meds; normotensive as of 7/14  . Polyarthralgia   . Rheumatoid arthritis(714.0)    patient reported history  . Routine gynecological examination    Dr. Everett Graff, [facility]  . 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    Current Outpatient Prescriptions on File Prior to Encounter  Medication Sig Dispense Refill  . albuterol (PROVENTIL HFA;VENTOLIN HFA) 108 (90 BASE) MCG/ACT inhaler Inhale 1-2 puffs into the lungs every 6 (six) hours as needed for wheezing or shortness of breath. 1 Inhaler 0     Assessment: 51 y.o. female with LLE swelling , possible DVT, she continues on IV heparin.  Initial heparin level low at 0.12, no bleeding issues noted, cbc stable.  Heparin level goal 0.3-0.7 Monitor platelets by anticoagulation protocol: Yes   Plan:  Increase heparin to 1650/hr, give  2000 unit bolus Recheck heparin level in 6 hours  Erin Hearing PharmD., BCPS Clinical Pharmacist Pager [phone] 05/10/2016 12:08 PM

## 2016-05-10 NOTE — Progress Notes (Addendum)
Telephone report called to Ander Purpura, RN.  Pt ready for transport to [facility].  Belongings packed.  Pt informed and aware of transfer.  VSS.  Heparin gtt remains infusing as ordered as 16.7ml/hr via LAC.  CCMD notified of pt transfer.  Transported to 2I11 via wheelchair by tech in stable condition with tele monitor.

## 2016-05-10 NOTE — Progress Notes (Signed)
Family Medicine Teaching Service Daily Progress Note Intern Pager: [phone]  Patient name: Alyssa Rangel Medical record number: [id number] Date of birth: 06/24/1965 Age: 51 y.o. Gender: female  Primary Care Provider: Carlyle Dolly, MD Consultants: None Code Status: Full  Pt Overview and Major Events to Date:  5/1 Admit to FPTS  Assessment and Plan: Alyssa Rangel is a 51 y.o. female presenting with cellulitis vs necrotizing fasciitis. PMH is significant for  HTN, Obesity, Pre-DM, RA?  Leg swelling and erthyema: Likely cellulitis 2/2 psoriatic patch on toe. Initial oncern for necrotizing fasciitis given sudden onset symptoms, ruled out with CT tib-fib. Afebrile, WBC improving to 10.9 from 11.9.  Exam still with left leg erythema, warmth, no crepitus. DVT has not been ruled out. D-dimer elevated at 0.90.  - Continue vancomycin (5/1-  ) , Zosyn (5/1 - ), DC Clindamycin as likely not necrotizing fasciitis.  - LE doppler to r/o DVT - continue  treatment dose heparin for ?DVT (Wells 3) - pain control with tylenol and ibuprofen, consider tramadol if pain not controlled - transfer to med-surg, no longer needs step down  HTN, new  - BP 155/86 this AM, may be elevated in setting of pain - Not on any anti-HTN meds - Monitor BP  ?Rheumatoid Arthritis: questinable history, patient reported. Normal RA factor and CCP per chart review. 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 Has used triamcinolone cream in the past, which has helped significantly:  Did note a hypopigmentation of her right ankle possibly from too much steroid use - Consider triamcinolone ointment BID  FEN/GI: diabetic diet PPx: treatment dose heparin for ?DVT  Disposition: Continue treatment and workup for LLE erythema and edema  Subjective:  No acute events overnight. Patient pleasant and comfortable this AM.  Objective: Temp:  [98.1 F (36.7 C)-99.3 F (37.4 C)] 98.1 F (36.7 C) (05/02 0817) Pulse Rate:  [72-93] 83 (05/02 0817) Resp:  [13-26] 21 (05/02 0817) BP: (99-159)/(54-86) 99/65 (05/02 0817) SpO2:  [90 %-99 %] 96 % (05/02 0817) Weight:  [258 lb (117 kg)-258 lb 9.6 oz (117.3 kg)] 258 lb 9.6 oz (117.3 kg) (05/02 0255) Physical Exam: General: NAD, rests comfortably Cardiovascular: RRR, no m/r/g Respiratory: CTA bil, no W/R/R Abdomen: soft and nontender Extremities: +leftLE edema and mild warmth, ttp, macular rash significantly retracted from margins drawn yesterday evening. No crepitus, distal pulses intact. +blue skin plaque noted over dorsum of second toe.  Laboratory: UDS THC LA 0.72 D dimer 0.90 Sed rate 56   Recent Labs Lab 05/09/16 2131 05/10/16 0315  WBC 11.9* 10.6*  HGB 11.6* 10.9*  HCT 36.2 34.4*  PLT 288 266    Recent Labs Lab 05/09/16 2131 05/10/16 0315  NA 138 138  K 3.6 3.4*  CL 106 106  CO2 24 24  BUN 9 7  CREATININE 0.64 0.70  CALCIUM 8.6* 8.3*  PROT 7.4  --   BILITOT 0.6  --   ALKPHOS 65  --   ALT 17  --   AST 13*  --  GLUCOSE 107* 106*    Imaging/Diagnostic Tests: Dg Tibia/fibula Left 05/09/2016 FINDINGS: No fracture or malalignment. Patellofemoral degenerative changes. Bony spur of the calcaneus. Moderate generalized edema.  No soft tissue gas.  IMPRESSION: Moderate diffuse soft tissue edema.  No acute osseous abnormality   Ct Tibia Fibula Left W Contrast  05/10/2016 FINDINGS: Bones/Joint/Cartilage No fracture or malalignment. Mild degenerative changes at the medial and lateral compartments of the knee  with small bony spurring visible. No large effusion at the visualized portions of the knee. No periostitis or bone destruction. Ligaments Suboptimally assessed by CT. Muscles and Tendons Achilles tendon is grossly intact. Medial and lateral tendons are grossly normal in position. Normal muscle bulk. Soft tissues Normal vascular enhancement. Moderate diffuse subcutaneous edema throughout the lower leg. Skin thickening distally. No rim enhancing fluid collections. No soft tissue gas. IMPRESSION:  1. Moderate diffuse subcutaneous edema and skin thickening consistent with cellulitis. No rim enhancing fluid collections to suggest an abscess.  2. No acute osseous abnormality.   Ct Tibia Fibula Left Wo Contrast  05/10/2016 FINDINGS: Subcutaneous edema, particularly in the mid lower leg. No soft tissue gas. No soft tissue foreign body. No drainable collections. No bone lesion or bony destruction.  IMPRESSION: Subcutaneous edema consistent with the described history of cellulitis. No evidence of abscess, foreign body or soft tissue gas. No bony abnormality.      Alyssa Coombe, MD 05/10/2016, 9:41 AM PGY-1, [place] Intern pager: [phone], text pages welcome

## 2016-05-10 NOTE — Progress Notes (Signed)
Checked on patient, no worsening or progression of cellulitis - will follow closely if worsening will consult surgery

## 2016-05-11 ENCOUNTER — Inpatient Hospital Stay (HOSPITAL_COMMUNITY): Payer: Self-pay

## 2016-05-11 DIAGNOSIS — M7989 Other specified soft tissue disorders: Secondary | ICD-10-CM

## 2016-05-11 DIAGNOSIS — M79605 Pain in left leg: Secondary | ICD-10-CM

## 2016-05-11 DIAGNOSIS — L03116 Cellulitis of left lower limb: Secondary | ICD-10-CM

## 2016-05-11 LAB — BASIC METABOLIC PANEL
ANION GAP: 9 (ref 5–15)
BUN: 8 mg/dL (ref 6–20)
CALCIUM: 8.3 mg/dL — AB (ref 8.9–10.3)
CO2: 22 mmol/L (ref 22–32)
Chloride: 106 mmol/L (ref 101–111)
Creatinine, Ser: 0.7 mg/dL (ref 0.44–1.00)
Glucose, Bld: 123 mg/dL — ABNORMAL HIGH (ref 65–99)
POTASSIUM: 3.8 mmol/L (ref 3.5–5.1)
SODIUM: 137 mmol/L (ref 135–145)

## 2016-05-11 LAB — CBC
HCT: 36.1 % (ref 36.0–46.0)
Hemoglobin: 11.2 g/dL — ABNORMAL LOW (ref 12.0–15.0)
MCH: 26.2 pg (ref 26.0–34.0)
MCHC: 31 g/dL (ref 30.0–36.0)
MCV: 84.5 fL (ref 78.0–100.0)
PLATELETS: 286 10*3/uL (ref 150–400)
RBC: 4.27 MIL/uL (ref 3.87–5.11)
RDW: 13.9 % (ref 11.5–15.5)
WBC: 8.1 10*3/uL (ref 4.0–10.5)

## 2016-05-11 LAB — HEMOGLOBIN A1C
HEMOGLOBIN A1C: 6.3 % — AB (ref 4.8–5.6)
MEAN PLASMA GLUCOSE: 134 mg/dL

## 2016-05-11 LAB — HEPARIN LEVEL (UNFRACTIONATED)
HEPARIN UNFRACTIONATED: 0.24 [IU]/mL — AB (ref 0.30–0.70)
HEPARIN UNFRACTIONATED: 0.48 [IU]/mL (ref 0.30–0.70)

## 2016-05-11 MED ORDER — DOXYCYCLINE HYCLATE 100 MG PO TABS
100.0000 mg | ORAL_TABLET | Freq: Two times a day (BID) | ORAL | Status: DC
Start: 2016-05-11 — End: 2016-05-12
  Administered 2016-05-11 – 2016-05-12 (×3): 100 mg via ORAL
  Filled 2016-05-11 (×3): qty 1

## 2016-05-11 MED ORDER — ALUM & MAG HYDROXIDE-SIMETH 200-200-20 MG/5ML PO SUSP
30.0000 mL | ORAL | Status: DC | PRN
  Administered 2016-05-11: 30 mL via ORAL
  Filled 2016-05-11: qty 30

## 2016-05-11 MED ORDER — DOXYCYCLINE HYCLATE 100 MG IV SOLR: 100.0000 mg | Freq: Two times a day (BID) | INTRAVENOUS | Status: DC

## 2016-05-11 NOTE — Progress Notes (Signed)
ANTICOAGULATION CONSULT NOTE  Pharmacy Consult for heparin Indication: R/O DVT  No Known Allergies  Patient Measurements: Height: 5\' 4"  (162.6 cm) Weight: 260 lb 8 oz (118.2 kg) IBW/kg (Calculated) : 54.7  Heparin dosing weight:  83 kg  Vital Signs: Temp: 98.9 F (37.2 C) (05/02 1916) Temp Source: Oral (05/02 1916) BP: 155/86 (05/02 1916) Pulse Rate: 66 (05/02 1916)  Labs:  Recent Labs  05/09/16 2131 05/10/16 0315 05/10/16 0757 05/10/16 1840 05/11/16 0221  HGB 11.6* 10.9*  --   --  11.2*  HCT 36.2 34.4*  --   --  36.1  PLT 288 266  --   --  286  HEPARINUNFRC  --   --  0.12* 0.12* 0.24*  CREATININE 0.64 0.70  --   --  0.70    Estimated Creatinine Clearance: 104 mL/min (by C-G formula based on SCr of 0.7 mg/dL).  Assessment: 51 y.o. female continues on heparin for LLE swelling , possible DVT. Heparin level remains subtherapeutic (0.24) on gtt at 1950 units/hr. No issues with line or bleeding reported per RN. CBC stable.  Goal: Heparin level goal 0.3-0.7 units/ml Monitor platelets by anticoagulation protocol: Yes   Plan:  Increase heparin to 2200 units/hr Recheck heparin level in 6 hours  Sherlon Handing, PharmD, BCPS Clinical pharmacist, pager [phone] 05/11/2016 3:16 AM

## 2016-05-11 NOTE — Progress Notes (Signed)
Family Medicine Teaching Service Daily Progress Note Intern Pager: [phone]  Patient name: Alyssa Rangel Medical record number: [id number] Date of birth: [birth year] Age: 51 y.o. Gender: female  Primary Care Provider: Carlyle Dolly, MD Consultants: None Code Status: Full  Pt Overview and Major Events to Date:  5/1 Admit to FPTS  Assessment and Plan: Alyssa Rangel is a 51 y.o. female presenting with cellulitis vs necrotizing fasciitis. PMH is significant for  HTN, Obesity, Pre-DM, RA?  Leg swelling and erthyema: Likely cellulitis 2/2 psoriatic patch on toe. Initial oncern for necrotizing fasciitis given sudden onset symptoms, ruled out with CT tib-fib. Afebrile, WBC improving.  Exam still with left leg erythema, warmth, no crepitus. DVT has not been ruled out. D-dimer elevated at 0.90.   - Continue vancomycin (5/1-  ), Zosyn (5/1 - ), DC Clindamycin as likely not necrotizing fasciitis. >>>>>transition to doxycycline today - LE doppler to r/o DVT - pending still, will call - continue  treatment dose heparin for ?DVT (Wells 3) - pain control with tylenol and ibuprofen, consider tramadol if pain not controlled  HTN, improved  - BP 132/52 this AM, may be elevated in setting of pain - Not on any anti-HTN meds - Monitor BP  ?Rheumatoid Arthritis: questinable history, patient reported. Normal RA factor and CCP per chart review. 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 Has used triamcinolone cream in the past, which has helped significantly:  Did note a hypopigmentation of her right ankle possibly from too much steroid use - Consider triamcinolone ointment BID  FEN/GI: diabetic diet PPx: treatment dose heparin for ?DVT  Disposition: Continue treatment and workup for LLE erythema and edema  Subjective:  No acute events overnight. Patient pleasant and comfortable this AM.  Objective: Temp:  [97.5 F (36.4 C)-98.9 F (37.2 C)] 97.5 F (36.4 C) (05/03 0500) Pulse Rate:  [62-91] 62 (05/03 0500) Resp:  [8-27] 16 (05/03 0500) BP: (132-158)/(52-88) 132/52 (05/03 0500) SpO2:  [96 %-97 %] 96 % (05/03 0500) Weight:  [260 lb 8 oz (118.2 kg)] 260 lb 8 oz (118.2 kg) (05/02 1916) Physical Exam: General: NAD, rests comfortably Cardiovascular: RRR, no m/r/g Respiratory: CTA bil, no W/R/R Abdomen: soft and nontender Extremities: +left LE edema and mild warmth continues to recede from drawn margins, ttp, macular rash significantly retracted from margins drawn yesterday evening. No crepitus, distal pulses intact. +blue plaque noted over dorsum of bilateral second toe.  Laboratory: UDS THC LA 0.72 D dimer 0.90 Sed rate 56   Recent Labs Lab 05/09/16 2131 05/10/16 0315 05/11/16 0221  WBC 11.9* 10.6* 8.1  HGB 11.6* 10.9* 11.2*  HCT 36.2 34.4* 36.1  PLT 288 266 286    Recent Labs Lab 05/09/16 2131 05/10/16 0315 05/11/16 0221  NA 138 138 137  K 3.6 3.4* 3.8  CL 106 106 106  CO2 24 24 22   BUN 9 7 8   CREATININE 0.64 0.70 0.70  CALCIUM 8.6* 8.3* 8.3*  PROT 7.4  --   --   BILITOT 0.6  --   --  ALKPHOS 65  --   --   ALT 17  --   --   AST 13*  --   --   GLUCOSE 107* 106* 123*    Imaging/Diagnostic Tests: Dg Tibia/fibula Left 05/09/2016 FINDINGS: No fracture or malalignment. Patellofemoral degenerative changes. Bony spur of the calcaneus. Moderate generalized edema.  No soft tissue gas.  IMPRESSION: Moderate diffuse soft tissue edema.  No acute osseous abnormality   Ct Tibia Fibula Left W Contrast   05/10/2016 FINDINGS: Bones/Joint/Cartilage No fracture or malalignment. Mild degenerative changes at the medial and lateral compartments of the knee with small bony spurring visible. No large effusion at the visualized portions of the knee. No periostitis or bone destruction. Ligaments Suboptimally assessed by CT. Muscles and Tendons Achilles tendon is grossly intact. Medial and lateral tendons are grossly normal in position. Normal muscle bulk. Soft tissues Normal vascular enhancement. Moderate diffuse subcutaneous edema throughout the lower leg. Skin thickening distally. No rim enhancing fluid collections. No soft tissue gas. IMPRESSION:  1. Moderate diffuse subcutaneous edema and skin thickening consistent with cellulitis. No rim enhancing fluid collections to suggest an abscess.  2. No acute osseous abnormality.   Ct Tibia Fibula Left Wo Contrast  05/10/2016 FINDINGS: Subcutaneous edema, particularly in the mid lower leg. No soft tissue gas. No soft tissue foreign body. No drainable collections. No bone lesion or bony destruction.  IMPRESSION: Subcutaneous edema consistent with the described history of cellulitis. No evidence of abscess, foreign body or soft tissue gas. No bony abnormality.      Everrett Coombe, MD 05/11/2016, 9:41 AM PGY-1, [place] Intern pager: [phone], text pages welcome

## 2016-05-11 NOTE — Progress Notes (Addendum)
PCP SOCIAL NOTE  Went to see Ms. Alyssa Rangel while she was in the hospital. She was very happy to see me and notes that she hopes to follow up with me in the outpatient setting once she leaves the hospital. She has been very pleased with her care thus far at [facility]. Appreciate the excellent care the inpatient family medicine teaching service team and nursing staff has been providing. Will continue to follow along socially.   Carlyle Dolly, MD 05/11/2016, 3:55 PM PGY-2, [place]

## 2016-05-11 NOTE — Progress Notes (Signed)
*  PRELIMINARY RESULTS* Vascular Ultrasound Left lower extremity venous duplex has been completed.  Preliminary findings: No evidence of DVT or baker's cyst.     Landry Mellow, RDMS, RVT  05/11/2016, 3:38 PM

## 2016-05-11 NOTE — Progress Notes (Signed)
ANTICOAGULATION CONSULT NOTE  Pharmacy Consult for heparin Indication: R/O DVT  No Known Allergies  Patient Measurements: Height: 5\' 4"  (162.6 cm) Weight: 260 lb 8 oz (118.2 kg) IBW/kg (Calculated) : 54.7  Heparin dosing weight:  83 kg  Vital Signs: Temp: 97.5 F (36.4 C) (05/03 0500) Temp Source: Oral (05/03 0500) BP: 132/52 (05/03 0500) Pulse Rate: 62 (05/03 0500)  Labs:  Recent Labs  05/09/16 2131 05/10/16 0315  05/10/16 1840 05/11/16 0221 05/11/16 0819  HGB 11.6* 10.9*  --   --  11.2*  --   HCT 36.2 34.4*  --   --  36.1  --   PLT 288 266  --   --  286  --   HEPARINUNFRC  --   --   < > 0.12* 0.24* 0.48  CREATININE 0.64 0.70  --   --  0.70  --   < > = values in this interval not displayed.  Estimated Creatinine Clearance: 104 mL/min (by C-G formula based on SCr of 0.7 mg/dL).  Assessment: 51 y.o. female on heparin for r/o DVT. Last heparin level is now therapeutic at 0.48. Hgb 11.2, plts wnl. No s/s of bleed.  Goal: Heparin level goal 0.3-0.7 units/ml Monitor platelets by anticoagulation protocol: Yes   Plan:  Continue heparin gtt at 2,200 units/hr Monitor daily heparin level, CBC, s/s of bleed F/U doppler results  Elenor Quinones, PharmD, BCPS Clinical Pharmacist Pager [phone] 05/11/2016 9:31 AM

## 2016-05-12 ENCOUNTER — Inpatient Hospital Stay (HOSPITAL_COMMUNITY): Payer: Self-pay

## 2016-05-12 DIAGNOSIS — I10 Essential (primary) hypertension: Secondary | ICD-10-CM

## 2016-05-12 DIAGNOSIS — M79605 Pain in left leg: Secondary | ICD-10-CM

## 2016-05-12 LAB — CBC
HCT: 34.6 % — ABNORMAL LOW (ref 36.0–46.0)
HCT: 36.4 % (ref 36.0–46.0)
HEMOGLOBIN: 10.7 g/dL — AB (ref 12.0–15.0)
Hemoglobin: 11.6 g/dL — ABNORMAL LOW (ref 12.0–15.0)
MCH: 26.4 pg (ref 26.0–34.0)
MCH: 27 pg (ref 26.0–34.0)
MCHC: 30.9 g/dL (ref 30.0–36.0)
MCHC: 31.9 g/dL (ref 30.0–36.0)
MCV: 85.4 fL (ref 78.0–100.0)
MCV: 84.7 fL (ref 78.0–100.0)
PLATELETS: 326 10*3/uL (ref 150–400)
Platelets: 291 10*3/uL (ref 150–400)
RBC: 4.05 MIL/uL (ref 3.87–5.11)
RBC: 4.3 MIL/uL (ref 3.87–5.11)
RDW: 14 % (ref 11.5–15.5)
RDW: 14 % (ref 11.5–15.5)
WBC: 7 10*3/uL (ref 4.0–10.5)
WBC: 7.9 10*3/uL (ref 4.0–10.5)

## 2016-05-12 LAB — CREATININE, SERUM
CREATININE: 0.65 mg/dL (ref 0.44–1.00)
GFR calc non Af Amer: >60 mL/min (ref >60)

## 2016-05-12 LAB — BASIC METABOLIC PANEL
Anion gap: 10 (ref 5–15)
BUN: 7 mg/dL (ref 6–20)
CHLORIDE: 104 mmol/L (ref 101–111)
CO2: 23 mmol/L (ref 22–32)
CREATININE: 0.61 mg/dL (ref 0.44–1.00)
Calcium: 8.5 mg/dL — ABNORMAL LOW (ref 8.9–10.3)
GFR calc non Af Amer: >60 mL/min (ref >60)
Glucose, Bld: 136 mg/dL — ABNORMAL HIGH (ref 65–99)
Potassium: 4.1 mmol/L (ref 3.5–5.1)
SODIUM: 137 mmol/L (ref 135–145)

## 2016-05-12 MED ORDER — TRAMADOL HCL 50 MG PO TABS: 50.0000 mg | ORAL_TABLET | Freq: Once | ORAL | 0 refills | Status: AC

## 2016-05-12 MED ORDER — TRAMADOL HCL 50 MG PO TABS
50.0000 mg | ORAL_TABLET | Freq: Once | ORAL | Status: AC
  Administered 2016-05-12: 50 mg via ORAL
  Filled 2016-05-12: qty 1

## 2016-05-12 MED ORDER — DOXYCYCLINE HYCLATE 100 MG PO TABS: 100.0000 mg | ORAL_TABLET | Freq: Two times a day (BID) | ORAL | 0 refills | Status: DC

## 2016-05-12 MED ORDER — TRIAMCINOLONE ACETONIDE 0.5 % EX OINT: TOPICAL_OINTMENT | Freq: Two times a day (BID) | CUTANEOUS | 0 refills | Status: DC

## 2016-05-12 MED ORDER — ENOXAPARIN SODIUM 40 MG/0.4ML ~~LOC~~ SOLN
40.0000 mg | SUBCUTANEOUS | Status: DC
  Administered 2016-05-12: 40 mg via SUBCUTANEOUS
  Filled 2016-05-12: qty 0.4

## 2016-05-12 NOTE — Evaluation (Signed)
Physical Therapy Evaluation Patient Details Name: Alyssa Rangel MRN: [id number] DOB: 10-19-1965 Today's Date: 05/12/2016   History of Present Illness  Alyssa Rangel is a 51 y.o. female presenting with cellulitis vs necrotizing fasciitis. PMH is significant for  HTN, Obesity, Pre-DM, RA?  Clinical Impression  Patient presents with limited mobility due to pain L LE with swelling and redness.  She will have necessary assist from mother at home and likely to progress as pain gets better.  Do not feel follow up PT services needed at this time.  RW for home use prior to d/c.    Follow Up Recommendations No PT follow up    Equipment Recommendations  Rolling walker with 5" wheels    Recommendations for Other Services       Precautions / Restrictions Precautions Precautions: Fall      Mobility  Bed Mobility Overal bed mobility: Modified Independent             General bed mobility comments: increased time, slow and painful with moving L LE  Transfers Overall transfer level: Needs assistance Equipment used: Rolling walker (2 wheeled) Transfers: Sit to/from Stand Sit to Stand: Min guard;Supervision         General transfer comment: cues for hand placement  Ambulation/Gait Ambulation/Gait assistance: Supervision Ambulation Distance (Feet): 150 Feet Assistive device: Rolling walker (2 wheeled) Gait Pattern/deviations: Step-through pattern;Step-to pattern;Trunk flexed     General Gait Details: initially unable to place heel on floor due to pain, over time able to hit with heel and roll over foot some  Stairs            Wheelchair Mobility    Modified Rankin (Stroke Patients Only)       Balance Overall balance assessment: Needs assistance   Sitting balance-Leahy Scale: Good     Standing balance support: No upper extremity supported;During functional activity Standing balance-Leahy Scale: Fair Standing balance comment: standing in hallway to  change out walker no UE support, washed hands without assist no UE support                             Pertinent Vitals/Pain Pain Assessment: 0-10 Pain Score: 7  Pain Location: L lower leg Pain Descriptors / Indicators: Throbbing Pain Intervention(s): Monitored during session    Home Living Family/patient expects to be discharged to:: Private residence Living Arrangements: Parent Available Help at Discharge: Family;Available 24 hours/day Type of Home: House Home Access: Stairs to enter Entrance Stairs-Rails: Right Entrance Stairs-Number of Steps: 4 Home Layout: One level Home Equipment: Cane - single point      Prior Function Level of Independence: Independent         Comments: works as Scientist, research (medical)        Extremity/Trunk Assessment   Upper Extremity Assessment Upper Extremity Assessment: Overall WFL for tasks assessed    Lower Extremity Assessment Lower Extremity Assessment: LLE deficits/detail LLE Deficits / Details: lifts antigravity and can bend knee and flex ankle, but limited by pain LLE: Unable to fully assess due to pain       Communication   Communication: No difficulties  Cognition Arousal/Alertness: Awake/alert Behavior During Therapy: WFL for tasks assessed/performed Overall Cognitive Status: Within Functional Limits for tasks assessed  General Comments General comments (skin integrity, edema, etc.): Patient educated on ankle AROM 5x each hour awake, to walk around each hour at home all for DVT prophylaxis    Exercises Total Joint Exercises Ankle Circles/Pumps: AROM;Left;10 reps;Seated   Assessment/Plan    PT Assessment Patent does not need any further PT services  PT Problem List         PT Treatment Interventions      PT Goals (Current goals can be found in the Care Plan section)  Acute Rehab PT Goals PT Goal Formulation: All assessment and  education complete, DC therapy    Frequency     Barriers to discharge        Co-evaluation               AM-PAC PT "6 Clicks" Daily Activity  Outcome Measure Difficulty turning over in bed (including adjusting bedclothes, sheets and blankets)?: None Difficulty moving from lying on back to sitting on the side of the bed? : A Little Difficulty sitting down on and standing up from a chair with arms (e.g., wheelchair, bedside commode, etc,.)?: A Little Help needed moving to and from a bed to chair (including a wheelchair)?: A Little Help needed walking in hospital room?: A Little Help needed climbing 3-5 steps with a railing? : A Little 6 Click Score: 19    End of Session Equipment Utilized During Treatment: Gait belt Activity Tolerance: Patient tolerated treatment well Patient left: in chair;with call bell/phone within reach   PT Visit Diagnosis: Pain;Difficulty in walking, not elsewhere classified (R26.2) Pain - Right/Left: Left Pain - part of body: Leg    Time: 1125-1150 PT Time Calculation (min) (ACUTE ONLY): 25 min   Charges:   PT Evaluation $PT Eval Moderate Complexity: 1 Procedure PT Treatments $Gait Training: 8-22 mins   PT G CodesMagda Kiel, [place] 05/12/2016   Reginia Naas 05/12/2016, 12:45 PM

## 2016-05-12 NOTE — Progress Notes (Signed)
Pt tearful, c/o increasing pain and edema to L ankle. Dr. Burr Medico notified.

## 2016-05-12 NOTE — Discharge Summary (Signed)
[facility] Discharge Summary  Patient name: Alyssa Rangel Medical record number: [id number] Date of birth: 30-Jun-1965 Age: 51 y.o. Gender: female Date of Admission: 05/09/2016  Date of Discharge: 05/12/2016 Admitting Physician: Blane Ohara McDiarmid, MD  Primary Care Provider: Carlyle Dolly, MD Consultants: None  Indication for Hospitalization: Cellulitis  Discharge Diagnoses/Problem List:  Patient Active Problem List   Diagnosis Date Noted  . Left leg pain   . Cellulitis of left lower extremity 05/10/2016  . Leg swelling   . Prediabetes 01/27/2015  . Obesity   . Primary osteoarthritis of both hips   . Hypertension   . OA (osteoarthritis) of hip 10/09/2012  . Menorrhagia 10/30/2011  . 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 Hypercholesterolemia   . Rheumatoid arthritis(714.0)      Disposition: Home  Discharge Condition: Improved and Stable  Discharge Exam:  Temp:  [97.4 F (36.3 C)-97.6 F (36.4 C)] 97.6 F (36.4 C) (05/04 0451) Pulse Rate:  [62-70] 62 (05/04 0451) Resp:  [16-18] 18 (05/04 0451) BP: (150-166)/(71-82) 165/82 (05/04 0451) SpO2:  [95 %-97 %] 96 % (05/04 0451) Physical Exam: General: NAD, rests comfortably Cardiovascular: RRR, no m/r/g Respiratory: CTA bil, no W/R/R Abdomen: soft and nontender Extremities: +left LE edema and mild warmth significantly receded from drawn margines. Still with ttp. distal pulses intact. +blue plaque noted over dorsum of bilateral second toe. +ankle erythema likely c/w cellulitis of the leg  Brief Hospital Course:  Alyssa F Richardsonis a 51 y.o.femalepresenting with cellulitis vs necrotizing fasciitis. PMH is significant for HTN, Obesity, Pre-DM, RA?  Patient was admitted with left lower extremity cellulitis that was rapidly progressing over one day. She was started on vancomycin, Zosyn and clindamycin out of concern for  possible necrotizing fasciitis, which was subsequently ruled out with a negative CT tib-fib and clindamycin was removed.  DVT was considered and patient was initially on heparin gtt until DVT was ruled out with negative LE doppler. Antibiotics were transitioned to oral with doxycycline and patient was monitored overnight. Margins of cellulitis continued to recede.   She was noted to have ankle edema and so XR of her ankle was ordered prior to discharge which showed edema of the surrounding tissue but no bony or joint abnormalities (other than a bone spur).  Pain was controlled with tramadol and tylenol at discharge. She still had mild-moderate ankle and lower extremity edema but redness was much improved at the time of discharge. She was agreeable to discharge with close follow-up.    Issues for Follow Up:  1. Cellulitis - Last day of doxycycline 5/11. Please follow up cellulitis continues to improve and resolve. Mild redness and swelling the day of discharge with tenderness to palpation.  2. Pain from cellulitis Patient was discharged with 10 pills of tramadol and a note for work until hospital follow up appointment when she should be reassessed. 3. Blood pressure - intermittently elevated during hospital stay. Please recheck at follow-up and add control as needed 4. Physical Therapy - walked patient prior to DC and recommended 4 wheel rolling walker which was ordered at discharge.  Significant Procedures: None  Significant Labs and Imaging:   UDS THC LA 0.72 D dimer 0.90 Sed rate 56  Recent Labs Lab 05/11/16 0221 05/12/16 0339 05/12/16 1156  WBC 8.1 7.0 7.9  HGB 11.2* 10.7* 11.6*  HCT 36.1 34.6* 36.4  PLT 286 291 326    Recent  Labs Lab 05/09/16 2131 05/10/16 0315 05/11/16 0221 05/12/16 0339 05/12/16 1156  NA 138 138 137 137  --   K 3.6 3.4* 3.8 4.1  --   CL 106 106 106 104  --   CO2 24 24 22 23   --   GLUCOSE 107* 106* 123* 136*  --   BUN 9 7 8 7   --   CREATININE 0.64  0.70 0.70 0.61 0.65  CALCIUM 8.6* 8.3* 8.3* 8.5*  --   ALKPHOS 65  --   --   --   --   AST 13*  --   --   --   --   ALT 17  --   --   --   --   ALBUMIN 3.2*  --   --   --   --    Dg Tibia/fibula Left 05/09/2016 FINDINGS: No fracture or malalignment. Patellofemoral degenerative changes. Bony spur of the calcaneus. Moderate generalized edema.  No soft tissue gas.  IMPRESSION: Moderate diffuse soft tissue edema.  No acute osseous abnormality   Ct Tibia Fibula Left W Contrast  05/10/2016 FINDINGS: Bones/Joint/Cartilage No fracture or malalignment. Mild degenerative changes at the medial and lateral compartments of the knee with small bony spurring visible. No large effusion at the visualized portions of the knee. No periostitis or bone destruction. Ligaments Suboptimally assessed by CT. Muscles and Tendons Achilles tendon is grossly intact. Medial and lateral tendons are grossly normal in position. Normal muscle bulk. Soft tissues Normal vascular enhancement. Moderate diffuse subcutaneous edema throughout the lower leg. Skin thickening distally. No rim enhancing fluid collections. No soft tissue gas. IMPRESSION:  1. Moderate diffuse subcutaneous edema and skin thickening consistent with cellulitis. No rim enhancing fluid collections to suggest an abscess.  2. No acute osseous abnormality.   Ct Tibia Fibula Left Wo Contrast  05/10/2016 FINDINGS: Subcutaneous edema, particularly in the mid lower leg. No soft tissue gas. No soft tissue foreign body. No drainable collections. No bone lesion or bony destruction.  IMPRESSION: Subcutaneous edema consistent with the described history of cellulitis. No evidence of abscess, foreign body or soft tissue gas. No bony abnormality.   Dg Ankle 2 Views Left  Result Date: 05/12/2016 LEFT CLINICAL DATA: Eight, redness, swelling. EXAM: LEFT ANKLE - 2 VIEW COMPARISON:  CT 05/10/2016 FINDINGS: Diffuse soft tissue swelling. No bony abnormality. No fracture, subluxation or  dislocation. No radiographic changes of osteomyelitis. Plantar calcaneal spur present. IMPRESSION: No acute bony abnormality. Electronically Signed   By: Rolm Baptise M.D.   On: 05/12/2016 12:19    Results/Tests Pending at Time of Discharge: None  Discharge Medications:  Allergies as of 05/12/2016   No Known Allergies     Medication List    TAKE these medications   albuterol 108 (90 Base) MCG/ACT inhaler Commonly known as:  PROVENTIL HFA;VENTOLIN HFA Inhale 1-2 puffs into the lungs every 6 (six) hours as needed for wheezing or shortness of breath.   doxycycline 100 MG tablet Commonly known as:  VIBRA-TABS Take 1 tablet (100 mg total) by mouth every 12 (twelve) hours.   traMADol 50 MG tablet Commonly known as:  ULTRAM Take 1 tablet (50 mg total) by mouth once.   triamcinolone ointment 0.5 % Commonly known as:  KENALOG Apply topically 2 (two) times daily.            Durable Medical Equipment        Start     Ordered   05/12/16 1433  For home  use only DME Walker rolling  Once    Comments:  Rolling walker with 5 inch wheels  Question:  Patient needs a walker to treat with the following condition  Answer:  Left leg cellulitis   05/12/16 1433   05/12/16 1201  For home use only DME Walker rolling  Once    Question:  Patient needs a walker to treat with the following condition  Answer:  Cellulitis   05/12/16 1201      Discharge Instructions: Please refer to Patient Instructions section of EMR for full details.  Patient was counseled important signs and symptoms that should prompt return to medical care, changes in medications, dietary instructions, activity restrictions, and follow up appointments.   Follow-Up Appointments: Follow-up Information    Haney,Alyssa, MD Follow up on 05/15/2016.   Specialty:  Family Medicine Why:  Please go to your hospital follow up at 11:15 AM. Contact information: [place] [address] [phone]           Everrett Coombe, MD 05/12/2016, 2:42 PM PGY-1, [place]

## 2016-05-12 NOTE — Progress Notes (Signed)
Family Medicine Teaching Service Daily Progress Note Intern Pager: [phone]  Patient name: Alyssa Rangel Medical record number: [id number] Date of birth: [birth year] Age: 51 y.o. Gender: female  Primary Care Provider: Carlyle Dolly, MD Consultants: None Code Status: Full  Pt Overview and Major Events to Date:  5/1 Admit to FPTS  Assessment and Plan: Alyssa Rangel is a 51 y.o. female presenting with cellulitis vs necrotizing fasciitis. PMH is significant for  HTN, Obesity, Pre-DM, RA?  Leg swelling and erthyema, improved: Likely cellulitis 2/2 psoriatic patch on toe.Afebrile, no leukocytosis.  Exam still with left leg erythema, warmth, no crepitus. DVT ruled out with negative doppler. - Discharge with course of doxycycline (5/3 - ) (s/p vanc and zosyn on 5/2) - heparin was DC'd once DVT ruled out - pain control with tylenol and ibuprofen, consider tramadol if pain not controlled - will provide work note to keep her out until hospital follow up 5/7  HTN - BP 165/82 this AM, may be elevated in setting of pain - Not on any anti-HTN meds - Monitor BP  ?Rheumatoid Arthritis: questinable history, patient reported. Normal RA factor and CCP per chart review. 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 Has used triamcinolone cream in the past, which has helped significantly: Did note a hypopigmentation of her right ankle possibly from too much steroid use - Consider triamcinolone ointment BID  FEN/GI: diabetic diet PPx: treatment dose  heparin for ?DVT  Disposition: Continue treatment and workup for LLE erythema and edema  Subjective:  No acute events overnight. Pleasant, comfortable this AM without complaints.  Objective: Temp:  [97.4 F (36.3 C)-97.6 F (36.4 C)] 97.6 F (36.4 C) (05/04 0451) Pulse Rate:  [62-70] 62 (05/04 0451) Resp:  [16-18] 18 (05/04 0451) BP: (150-166)/(71-82) 165/82 (05/04 0451) SpO2:  [95 %-97 %] 96 % (05/04 0451) Physical Exam: General: NAD, rests comfortably Cardiovascular: RRR, no m/r/g Respiratory: CTA bil, no W/R/R Abdomen: soft and nontender Extremities: +left LE edema and mild warmth significantly receded from drawn margines. Still with ttp. distal pulses intact. +blue plaque noted over dorsum of bilateral second toe. +ankle erythema likely c/w cellulitis of the leg  Laboratory: UDS THC LA 0.72 D dimer 0.90 Sed rate 56   Recent Labs Lab 05/10/16 0315 05/11/16 0221 05/12/16 0339  WBC 10.6* 8.1 7.0  HGB 10.9* 11.2* 10.7*  HCT 34.4* 36.1 34.6*  PLT 266 286 291    Recent Labs Lab 05/09/16 2131 05/10/16 0315 05/11/16 0221 05/12/16 0339  NA 138 138 137 137  K 3.6 3.4* 3.8 4.1  CL 106 106 106 104  CO2 24 24 22 23   BUN 9 7 8 7   CREATININE 0.64 0.70 0.70 0.61  CALCIUM 8.6* 8.3* 8.3* 8.5*  PROT 7.4  --   --   --   BILITOT 0.6  --   --   --   ALKPHOS 65  --   --   --   ALT 17  --   --   --   AST 13*  --   --   --  GLUCOSE 107* 106* 123* 136*    Imaging/Diagnostic Tests: Dg Tibia/fibula Left 05/09/2016 FINDINGS: No fracture or malalignment. Patellofemoral degenerative changes. Bony spur of the calcaneus. Moderate generalized edema.  No soft tissue gas.  IMPRESSION: Moderate diffuse soft tissue edema.  No acute osseous abnormality   Ct Tibia Fibula Left W Contrast  05/10/2016 FINDINGS: Bones/Joint/Cartilage No fracture or malalignment. Mild degenerative changes at the medial and lateral compartments of the knee with small bony spurring visible. No large effusion  at the visualized portions of the knee. No periostitis or bone destruction. Ligaments Suboptimally assessed by CT. Muscles and Tendons Achilles tendon is grossly intact. Medial and lateral tendons are grossly normal in position. Normal muscle bulk. Soft tissues Normal vascular enhancement. Moderate diffuse subcutaneous edema throughout the lower leg. Skin thickening distally. No rim enhancing fluid collections. No soft tissue gas. IMPRESSION:  1. Moderate diffuse subcutaneous edema and skin thickening consistent with cellulitis. No rim enhancing fluid collections to suggest an abscess.  2. No acute osseous abnormality.   Ct Tibia Fibula Left Wo Contrast  05/10/2016 FINDINGS: Subcutaneous edema, particularly in the mid lower leg. No soft tissue gas. No soft tissue foreign body. No drainable collections. No bone lesion or bony destruction.  IMPRESSION: Subcutaneous edema consistent with the described history of cellulitis. No evidence of abscess, foreign body or soft tissue gas. No bony abnormality.      Everrett Coombe, MD 05/12/2016, 9:25 AM PGY-1, [place] Intern pager: [phone], text pages welcome

## 2016-05-12 NOTE — Discharge Instructions (Signed)
You were hospitalized with  Cellulitis. You were treated with antibiotics through the IV and as you improved you were transitioned to antibiotics by mouth. You had a Xray of your ankle which was normal except showed some swelling, as would be expected with the soft tissue infection.  Please go to your hospital follow up appointment on Monday.

## 2016-05-12 NOTE — Discharge Planning (Signed)
Patient discharged home in stable condition. Verbalizes understanding of all discharge instructions, including home medications and follow up appointments. 

## 2016-05-12 NOTE — Progress Notes (Signed)
FPTS Interim Progress Note   Page by nursing, patient concerned about going home with ankle pain. We had pulled back tramadol earlier this AM however patient notes her pain is best controlled with tramadol and tylenol.  She is agreeable to going home with tramadol and tylenol to help control her pain at home.  She continues to have some lower extremity/ankle swelling, however cellulitis continues to improve on doxycycline.  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  She was written a work excuse through Monday and tramadol pain control through Monday. Once she is re-evaluated at her follow up appointment decision can be made regarding need for more pain medication at that point as well as whether it is appropriate for her to go back to work or take more time off.  This was all discussed with the patient, and she is agreeable to this plan. Return precautions were advised.   Everrett Coombe, MD 05/12/2016, 2:46 PM PGY-1, [place] Medicine Service pager [phone]

## 2016-05-12 NOTE — Care Management Note (Addendum)
Case Management Note  Patient Details  Name: Alyssa Rangel MRN: [id number] Date of Birth: [birth year]  Subjective/Objective:                    Action/Plan:  Ordered walker through [facility] Expected Discharge Date:  05/12/16               Expected Discharge Plan:  Home/Self Care  In-House Referral:     Discharge planning Services  CM Consult, [place], Medication Assistance, [facility]  Post Acute Care Choice:    Choice offered to:  Patient  DME Arranged:    DME Agency:     HH Arranged:    [place] Agency:     Status of Service:  Completed, signed off  If discussed at [employer] of Stay Meetings, dates discussed:    Additional Comments:  Marilu Favre, RN 05/12/2016, 3:03 PM

## 2016-05-12 NOTE — Progress Notes (Signed)
Transitions of Care Pharmacy Note  Plan:  Educated on doxycycline  --------------------------------------------- Alyssa Rangel is an 51 y.o. female who presents with a chief complaint of LLE cellulitis. In anticipation of discharge, pharmacy has reviewed this patient's prior to admission medication history, as well as current inpatient medications listed per the [facility].  Current medication indications, dosing, frequency, and notable side effects reviewed with patient. patient verbalized understanding of current inpatient medication regimen and is aware that the After Visit Summary when presented, will represent the most accurate medication list at discharge.   Kirt Boys did not express any concerns regarding her medications.  Discussed dose and frequency of doxycycline with patient as well as potential side effects (esophagitis, GI upset, photosensitivity). Patient is aware that she will need to complete this medication in its entirety. Patient reported no further questions or concerns at the conclusion of our visit.   Assessment: Understanding of regimen: good Understanding of indications: good Potential of compliance: good Barriers to Obtaining Medications: No  Patient instructed to contact inpatient pharmacy team with further questions or concerns if needed.    Time spent preparing for discharge counseling: 15 min  Time spent counseling patient: 10 min    Argie Ramming, PharmD Pharmacy Resident  Pager [phone] 05/12/16 9:01 AM

## 2016-05-14 LAB — CULTURE, BLOOD (ROUTINE X 2)
CULTURE: NO GROWTH
CULTURE: NO GROWTH
SPECIAL REQUESTS: ADEQUATE
Special Requests: ADEQUATE

## 2016-05-15 ENCOUNTER — Encounter: Payer: Self-pay | Admitting: Student

## 2016-05-15 ENCOUNTER — Ambulatory Visit (INDEPENDENT_AMBULATORY_CARE_PROVIDER_SITE_OTHER): Payer: PRIVATE HEALTH INSURANCE | Admitting: Student

## 2016-05-15 DIAGNOSIS — L03116 Cellulitis of left lower limb: Secondary | ICD-10-CM

## 2016-05-15 MED ORDER — TRAMADOL HCL 50 MG PO TABS: 50.0000 mg | ORAL_TABLET | Freq: Four times a day (QID) | ORAL | 0 refills | Status: DC | PRN

## 2016-05-15 NOTE — Assessment & Plan Note (Signed)
Cellulitis overall improving, with some residual induration. No fevers - continue doxycycline to complete the course - return in 3 days to assess cellulitis - tramadol Rx given - work note given today for the rest of this week

## 2016-05-15 NOTE — Progress Notes (Signed)
   Subjective:    Patient ID: Alyssa Rangel, female    DOB: Sep 01, 1965, 51 y.o.   MRN: [id number]   CC: Hospital follow up for cellulitis  HPI: 51 y/o F presents for hospital follow up for left LE cellulitis  Left LE cellulitis - treated with doxycycline with last day 5/11, patient reports complaince with this regimen - no fevers - pain improving  Smoking status reviewed  Review of Systems  Per HPI, chest pain, shortness of breath   Objective:  BP 132/64   Pulse 80   Temp 98.2 F (36.8 C) (Oral)   Ht 5\' 4"  (1.626 m)   Wt 259 lb (117.5 kg)   SpO2 92%   BMI 44.46 kg/m  Vitals and nursing note reviewed  General: NAD Cardiac: RRR,  Respiratory: CTAB, normal effort Extremities: left LE with mild patchy erythema well below the previous demarcation line, small area of residual induration with warmth and tenderness adjacent to the lateral malleoulus Neuro: alert and oriented, no focal deficits   Assessment & Plan:    Cellulitis of left lower extremity Cellulitis overall improving, with some residual induration. No fevers - continue doxycycline to complete the course - return in 3 days to assess cellulitis - tramadol Rx given - work note given today for the rest of this week    Sangita Zani A. Lincoln Brigham MD, [place] [facility] Resident PGY-3 Pager [phone]

## 2016-05-15 NOTE — Patient Instructions (Signed)
Follow up on Thursday 5/10 for leg cellulitis If you feel your cellulitis is worse, or you have further concerns, return to the office sooner Call the office at [phone] with questions or concerns

## 2016-05-16 ENCOUNTER — Inpatient Hospital Stay: Payer: Self-pay | Admitting: Family Medicine

## 2016-05-18 ENCOUNTER — Inpatient Hospital Stay: Payer: Self-pay | Admitting: Family Medicine

## 2016-05-19 ENCOUNTER — Encounter: Payer: Self-pay | Admitting: Internal Medicine

## 2016-05-19 ENCOUNTER — Ambulatory Visit (INDEPENDENT_AMBULATORY_CARE_PROVIDER_SITE_OTHER): Payer: PRIVATE HEALTH INSURANCE | Admitting: Internal Medicine

## 2016-05-19 VITALS — BP 122/68 | HR 83 | Temp 98.3°F | Ht 64.0 in | Wt 252.2 lb

## 2016-05-19 DIAGNOSIS — B373 Candidiasis of vulva and vagina: Secondary | ICD-10-CM

## 2016-05-19 DIAGNOSIS — L03116 Cellulitis of left lower limb: Secondary | ICD-10-CM | POA: Diagnosis not present

## 2016-05-19 DIAGNOSIS — B3731 Acute candidiasis of vulva and vagina: Secondary | ICD-10-CM

## 2016-05-19 MED ORDER — FLUCONAZOLE 150 MG PO TABS: 150.0000 mg | ORAL_TABLET | Freq: Once | ORAL | 0 refills | Status: DC

## 2016-05-19 NOTE — Patient Instructions (Signed)
Take the Diflucan once and if symptoms resolve with one pill you do not need to take the second. If symptoms do not completely resolve, take the second pill 72 hours later. If you still have symptoms despite two pills please return for another visit.

## 2016-05-19 NOTE — Progress Notes (Signed)
   Subjective:    Alyssa Rangel - 51 y.o. female MRN [id number]  Date of birth: 29-Aug-1965  HPI  Alyssa Rangel is here for follow up.   Left LE Cellulitis: Has one more dose of Doxycyline to take today to complete course of antibiotics. Pain has resolved. No fevers, nausea or vomiting at home. Significant improvement in erythema. Patient feels ready to return to work.   Vaginal Itching: Patient reporting significant itching in the vagina area with burning sensation. She has been scratching a lot. Mild increase in vaginal discharge reported. Denies dysuria, urinary frequency or urgency, and suprapubic pain.    -  reports that she quit smoking about 4 years ago. Her smoking use included Cigarettes. She has a 25.00 pack-year smoking history. She has never used smokeless tobacco. - Review of Systems: Per HPI. - Past Medical History: Patient Active Problem List   Diagnosis Date Noted  . Left leg pain   . Cellulitis of left lower extremity 05/10/2016  . Leg swelling   . Prediabetes 01/27/2015  . Obesity   . Primary osteoarthritis of both hips   . Hypertension   . OA (osteoarthritis) of hip 10/09/2012  . Menorrhagia 10/30/2011  . 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 Hypercholesterolemia   . Rheumatoid arthritis(714.0)    - Medications: reviewed and updated   Objective:   Physical Exam BP 122/68   Pulse 83   Temp 98.3 F (36.8 C) (Oral)   Ht 5\' 4"  (1.626 m)   Wt 252 lb 3.2 oz (114.4 kg)   SpO2 95%   BMI 43.29 kg/m  Gen: NAD, alert, cooperative with exam, well-appearing Extremities: Left LE without erythema. Small area of residual induration with TTP but without erythema or warmth present adjacent to the lateral malleolus.     Assessment & Plan:   1. Yeast infection of the vagina Symptoms consistent with yeast infection especially given recent antibiotic use. Patient to return if symptoms do not  resolve with Diflucan.  - fluconazole (DIFLUCAN) 150 MG tablet; Take 1 tablet (150 mg total) by mouth once. Take second pill 72 hours later.  Dispense: 2 tablet; Refill: 0  2. Cellulitis of left lower extremity Seems to have essentially resolved. No systemic symptoms present.  -finish Doxycyline today, does not have signs on exam that would warrant additional antibiotics  -return to work note done today  -return precautions reviewed   Phill Myron, D.O. 05/19/2016, 10:26 AM PGY-2, [place]

## 2016-05-19 NOTE — Assessment & Plan Note (Signed)
Seems to have essentially resolved. No systemic symptoms present.  -finish Doxycyline today, does not have signs on exam that would warrant additional antibiotics  -return to work note done today  -return precautions reviewed

## 2016-06-16 ENCOUNTER — Encounter: Payer: Self-pay | Admitting: Family Medicine

## 2016-06-16 ENCOUNTER — Ambulatory Visit (INDEPENDENT_AMBULATORY_CARE_PROVIDER_SITE_OTHER): Payer: PRIVATE HEALTH INSURANCE | Admitting: Family Medicine

## 2016-06-16 VITALS — BP 130/60 | HR 92 | Temp 98.3°F | Ht 64.0 in | Wt 257.2 lb

## 2016-06-16 DIAGNOSIS — N3946 Mixed incontinence: Secondary | ICD-10-CM

## 2016-06-16 DIAGNOSIS — Z1159 Encounter for screening for other viral diseases: Secondary | ICD-10-CM | POA: Diagnosis not present

## 2016-06-16 DIAGNOSIS — R32 Unspecified urinary incontinence: Secondary | ICD-10-CM | POA: Insufficient documentation

## 2016-06-16 DIAGNOSIS — R7303 Prediabetes: Secondary | ICD-10-CM | POA: Diagnosis not present

## 2016-06-16 DIAGNOSIS — E785 Hyperlipidemia, unspecified: Secondary | ICD-10-CM | POA: Diagnosis not present

## 2016-06-16 DIAGNOSIS — Z0000 Encounter for general adult medical examination without abnormal findings: Secondary | ICD-10-CM | POA: Insufficient documentation

## 2016-06-16 DIAGNOSIS — M2550 Pain in unspecified joint: Secondary | ICD-10-CM | POA: Diagnosis not present

## 2016-06-16 DIAGNOSIS — Z1211 Encounter for screening for malignant neoplasm of colon: Secondary | ICD-10-CM

## 2016-06-16 MED ORDER — SOLIFENACIN SUCCINATE 5 MG PO TABS
5.0000 mg | ORAL_TABLET | Freq: Every day | ORAL | 1 refills | Status: DC
Start: 2016-06-16 — End: 2016-07-07

## 2016-06-16 MED ORDER — DICLOFENAC SODIUM 75 MG PO TBEC: 75.0000 mg | DELAYED_RELEASE_TABLET | Freq: Two times a day (BID) | ORAL | 2 refills | Status: DC

## 2016-06-16 NOTE — Patient Instructions (Addendum)
Thank you for coming in today, it was so nice to see you! Today we talked about:    Colonoscopy: I have placed a referral for you to have this done. Someone will call you to schedule this  Mammogram: Please call the number on the paper to schedule your mammogram  Pre-diabetes: Your sugars have been high on the blood tests we have done. It is important to continue to try and eat less sugars and carbohydrates to avoid getting diabetes. We will need to recheck your hemoglobin A1c in August. If it is more than 6.5 that means you have diabetes (it is 6.3 today).   Urinary problems: We will start you on Vesicare 5 mg daily and do Kegel exercises and see if that helps. If it doesn't I will refer you to a urologist  Joint pains: Take diclofenac twice daily as needed for pain.   Use compression socks to help with leg pain and swelling  Please follow up in June 28th in the afternoon to see me for a pap smear and lab work. You can schedule this appointment at the front desk before you leave or call the clinic.  Bring in all your medications or supplements to each appointment for review.   If you have any questions or concerns, please do not hesitate to call the office at [phone]. You can also message me directly via MyChart.   Sincerely,  Smitty Cords, MD

## 2016-06-16 NOTE — Progress Notes (Signed)
Subjective:  Alyssa Rangel is a 51 y.o. year old female who presents to office today for an annual physical examination.  Concerns today include:  1. Prediabetes: Last hgb A1c was 6.3 on 5/2. Has been trying to cut back on carbohydrates and sweets. No exercise.  2. Urinary Problems: Patient notes that for the last year or so she has had some leakage of urine. She notes that whenever she feels like she has to urinate she has to go to the restroom immediately or else she will have some urinary incontinence. She also notes that whenever she sneezes sometimes a little urine comes out,. She has had to use pads in her underwear to avoid any embarrassment. Denies any dysuria or polyuria. Has never had any children. Does admit to some abdominal surgeries that she has had her gallbladder removed and had a cyst removed from her ovary. 3. Joint pain "all over ": Patient knows that she has joint pains. Most of her pain is in her shoulders her hands and her knees. Patient notes that she has tried some tramadol intermittently which doesn't help very much. Patient notes that sometimes her shoulders becomes stiff when she tries to lift her arms over her head. The pain is not worse at any particular time of day. Denies any fevers. Of note she was diagnosed with rheumatoid arthritis in the past. She notes that she has never seen a rheumatologist. Cannot remember if she ever had blood work for this.   Gen.: No fevers  Respiratory:Negative for cough, shortness of breath and wheezing.   Cardiovascular: Negative for chest pain Gastrointestinal: Negative for abdominal pain, blood in stool, constipation, diarrhea, heartburn, melena, nausea and vomiting.  Genitourinary: Negative for dysuria and frequency.  Musculoskeletal: no joint swelling  Skin: Negative for rash.  Neurological: Negative for dizziness, tingling, focal weakness and headaches.  Psychiatric/Behavioral: Negative for depression and suicidal ideas.    General Healthcare: Medication Compliance: Yes Dx Hypertension: Yes, not on medications  Dx Hyperlipidemia: No  Diabetes: No. But does have prediabetes Dx Obesity: Yes Weight Loss: No Physical Activity: Usually just at work  Urinary Incontinence: Yes, see above  Menstrual hx: No periods  Social:  reports that she quit smoking about 4 years ago. Her smoking use included Cigarettes. She has a 25.00 pack-year smoking history. She has never used smokeless tobacco. Driving: Yes drives herself, wears seatbelt  Alcohol Use: socially  Tobacco No   Other Drugs: Marijuana  Support and Life at Home: Yes Advanced Directives: No Work: CMA  Cancer:  Colorectal >> Colonoscopy: Due  Lung >> Tobacco Use: Yes. Used to smoke cigarettes but quit in 2013             - If so, previous Low-Dose CT screen: N/A  Breast >> Mammogram: Due Cervical/Endometrial >>  - Postmenopausal: Yes - Hysterectomy: No but had an ablation  - Vaginal Bleeding: No Skin >> Suspicious lesions: No  Other: Osteoporosis: No TDAP: Up to date  Health Maintenance Due  Topic Date Due  . Hepatitis C Screening  06-Sep-1965  . COLONOSCOPY  07/30/2013  . MAMMOGRAM  08/09/2014  . PAP SMEAR  09/21/2014    Past Medical History Past Medical History:  Diagnosis Date  . Anemia   . Cholecystitis, acute 11/02/2012  . Hearing loss   . Hip pain    pt states no cartilage in right hip very painful  . History of body piercing    tongue ring  . Hypercholesterolemia  prior medication therapy  . Hypertension    due to diet pills in the past, never on meds; normotensive as of 7/14  . Polyarthralgia   . Rheumatoid arthritis(714.0)    patient reported history  . Routine gynecological examination    Dr. Everett Graff, [facility]  . 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 Joint pain 06/16/2016  . Encounter for annual  health examination 06/16/2016  . Urinary incontinence 06/16/2016  . Left leg pain   . Cellulitis of left lower extremity 05/10/2016  . Prediabetes 01/27/2015  . Obesity   . Primary osteoarthritis of both hips   . OA (osteoarthritis) of hip 10/09/2012  . 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 Hypercholesterolemia   . Rheumatoid arthritis(714.0)     Medications- reviewed and updated Current Outpatient Prescriptions  Medication Sig Dispense Refill  . traMADol (ULTRAM) 50 MG tablet Take 1 tablet (50 mg total) by mouth every 6 (six) hours as needed. 30 tablet 0  . albuterol (PROVENTIL HFA;VENTOLIN HFA) 108 (90 BASE) MCG/ACT inhaler Inhale 1-2 puffs into the lungs every 6 (six) hours as needed for wheezing or shortness of breath. (Patient not taking: Reported on 06/16/2016) 1 Inhaler 0  . diclofenac (VOLTAREN) 75 MG EC tablet Take 1 tablet (75 mg total) by mouth 2 (two) times daily. 30 tablet 2  . solifenacin (VESICARE) 5 MG tablet Take 1 tablet (5 mg total) by mouth daily. 30 tablet 1  . triamcinolone ointment (KENALOG) 0.5 % Apply topically 2 (two) times daily. (Patient not taking: Reported on 06/16/2016) 30 g 0   No current facility-administered medications for this visit.     Objective: BP 130/60   Pulse 92   Temp 98.3 F (36.8 C) (Oral)   Ht _0  (1.626 m)   Wt 257 lb 3.2 oz (116.7 kg)   SpO2 96%   BMI 44.15 kg/m  Gen: In no acute distress, alert, cooperative with exam, well groomed, obese HEENT: NCAT, EOMI, PERRL, poor dentition  CV: Regular rate and rhythm, normal S1/S2, no murmur Resp: Clear to auscultation bilaterally, no wheezes, non-labored Abd: Soft, Non Tender, Non Distended, bowel sounds present, no guarding or organomegaly Ext: No edema, warm and well perfused Neuro: Alert and oriented, No gross deficits, normal gait Psych: Normal mood and affect  Assessment/Plan:  Prediabetes Last hemoglobin A1c was  6.3 on 05/10/2016. Patient has been trying to avoid diabetes with diet modification and exercise. Discussed continuing this at today's visit. Could consider rechecking hemoglobin A1c in a couple months to see if it has changed at all. Discussed with patient that if her A1c is above 6.5 we will be starting metformin.  Joint pain In various locations throughout body according to patient. Physical exam largely unrevealing. Differentials include fibromyalgia versus rheumatoid arthritis(has a history of this but no rheumatoid labs found in Epic ) versus osteoarthritis. Advised  to stop using tramadol.  Will start diclofenac 75 mg twice a day when necessary. Will draw rheumatoid labs at future visit in a couple weeks when we do her Pap smear. Labs will include rheumatoid factor, ANA, CRP, ESR.  Encounter for annual health examination Patient doing well overall. See other assessment and plans. Referral to gastroenterology for colonoscopy. Provided information to receive mammogram. Will obtain hepatitis C screening and Pap smear in 2 weeks.  Urinary incontinence Mixed stress and urge incontinence. Did not perform genitourinary exam today as we are going to do her Pap smear in a couple weeks and will perform then. Will start patient on 5 mg of Vesicare daily. Discussed Ke[place] exercises. If conservative measures and Vesicare do not improve symptoms, will refer to urology.    Orders Placed This Encounter  Procedures  . Rheumatoid Arthritis Profile    Standing Status:   Future    Standing Expiration Date:   06/16/2017  . ANA    Standing Status:   Future    Standing Expiration Date:   06/16/2017  . C-reactive protein    Standing Status:   Future    Standing Expiration Date:   06/16/2017  . Sedimentation Rate    Standing Status:   Future    Standing Expiration Date:   06/16/2017  . Lipid panel    Standing Status:   Future    Standing Expiration Date:   06/16/2017  . Hepatitis C antibody    Standing Status:    Future    Standing Expiration Date:   06/16/2017  . Ambulatory referral to Gastroenterology    Referral Priority:   Routine    Referral Type:   Consultation    Referral Reason:   Specialty Services Required    Number of Visits Requested:   1    Meds ordered this encounter  Medications  . solifenacin (VESICARE) 5 MG tablet    Sig: Take 1 tablet (5 mg total) by mouth daily.    Dispense:  30 tablet    Refill:  1  . diclofenac (VOLTAREN) 75 MG EC tablet    Sig: Take 1 tablet (75 mg total) by mouth 2 (two) times daily.    Dispense:  30 tablet    Refill:  2     Smitty Cords, MD [place], PGY-2

## 2016-06-16 NOTE — Assessment & Plan Note (Signed)
Last hemoglobin A1c was 6.3 on 05/10/2016. Patient has been trying to avoid diabetes with diet modification and exercise. Discussed continuing this at today's visit. Could consider rechecking hemoglobin A1c in a couple months to see if it has changed at all. Discussed with patient that if her A1c is above 6.5 we will be starting metformin.

## 2016-06-16 NOTE — Assessment & Plan Note (Signed)
Patient doing well overall. See other assessment and plans. Referral to gastroenterology for colonoscopy. Provided information to receive mammogram. Will obtain hepatitis C screening and Pap smear in 2 weeks.

## 2016-06-16 NOTE — Assessment & Plan Note (Signed)
Mixed stress and urge incontinence. Did not perform genitourinary exam today as we are going to do her Pap smear in a couple weeks and will perform then. Will start patient on 5 mg of Vesicare daily. Discussed Keagle exercises. If conservative measures and Vesicare do not improve symptoms, will refer to urology.

## 2016-06-16 NOTE — Assessment & Plan Note (Addendum)
In various locations throughout body according to patient. Physical exam largely unrevealing. Differentials include fibromyalgia versus rheumatoid arthritis(has a history of this but no rheumatoid labs found in Epic ) versus osteoarthritis. Advised  to stop using tramadol.  Will start diclofenac 75 mg twice a day when necessary. Will draw rheumatoid labs at future visit in a couple weeks when we do her Pap smear. Labs will include rheumatoid factor, ANA, CRP, ESR.

## 2016-06-30 ENCOUNTER — Telehealth: Payer: Self-pay | Admitting: *Deleted

## 2016-06-30 NOTE — Telephone Encounter (Signed)
Prior Authorization received from [employer] for Vesicare 5 mg. Formulary preferred: oxybutynin or oxybutynin ER. PA form placed in provider box for completion. Derl Barrow, RN

## 2016-07-04 NOTE — Telephone Encounter (Signed)
I cannot find the prior authorization in my box. Is it possible it may be somewhere else?

## 2016-07-05 NOTE — Telephone Encounter (Signed)
It was placed in your box.  PA form reprinted and place in provider box.  Derl Barrow, RN

## 2016-07-06 ENCOUNTER — Other Ambulatory Visit (HOSPITAL_COMMUNITY)
Admission: RE | Admit: 2016-07-06 | Discharge: 2016-07-06 | Disposition: A | Discharge status: Home / Self Care | Payer: PRIVATE HEALTH INSURANCE | Source: Ambulatory Visit | Attending: Family Medicine | Admitting: Family Medicine

## 2016-07-06 ENCOUNTER — Encounter: Payer: Self-pay | Admitting: Family Medicine

## 2016-07-06 ENCOUNTER — Ambulatory Visit (INDEPENDENT_AMBULATORY_CARE_PROVIDER_SITE_OTHER): Payer: PRIVATE HEALTH INSURANCE | Admitting: Family Medicine

## 2016-07-06 VITALS — BP 128/70 | HR 69 | Temp 98.3°F | Ht 64.0 in | Wt 257.0 lb

## 2016-07-06 DIAGNOSIS — Z1159 Encounter for screening for other viral diseases: Secondary | ICD-10-CM

## 2016-07-06 DIAGNOSIS — Z124 Encounter for screening for malignant neoplasm of cervix: Secondary | ICD-10-CM | POA: Insufficient documentation

## 2016-07-06 DIAGNOSIS — N3946 Mixed incontinence: Secondary | ICD-10-CM | POA: Diagnosis not present

## 2016-07-06 DIAGNOSIS — E785 Hyperlipidemia, unspecified: Secondary | ICD-10-CM

## 2016-07-06 DIAGNOSIS — M2550 Pain in unspecified joint: Secondary | ICD-10-CM | POA: Diagnosis not present

## 2016-07-06 NOTE — Patient Instructions (Signed)
Thank you for coming in today, it was so nice to see you! Today we talked about:    Pap smear  Blood work  If we ordered any tests today, you will be notified via telephone of any abnormalities. If everything is normal you will get a letter in the mail.   If you have any questions or concerns, please do not hesitate to call the office at [phone]. You can also message me directly via MyChart.   Sincerely,  Smitty Cords, MD

## 2016-07-06 NOTE — Progress Notes (Addendum)
   Subjective:    Patient ID: Alyssa Rangel , female   DOB: September 29, 1965 , 51 y.o..   MRN: [id number]  HPI  Alyssa Rangel is here for  Chief Complaint  Patient presents with  . Gynecologic Exam  . Medication Problem    vasocare not coverd by insurance   1. Pap smear: Patient is here today to receive her Pap smear. She notes that her last one was many years. Denies any previously abnormal Pap smears. Patient states that she is just not getting her Pap smear because she family has some insurance to do so. She is postmenopausal . Admits that she has had the uterine ablation for. Denies any vaginal bleeding or discharge.  2: Difficulty getting medication: Patient is sent her Laurin Coder is not covered by her insurance for urinary incontinence. She has not been able to take this medication from pharmacy.  Review of Systems: Per HPI.   Past Medical History: Patient Active Problem List   Diagnosis Date Noted  . Joint pain 06/16/2016  . Encounter for annual health examination 06/16/2016  . Urinary incontinence 06/16/2016  . Left leg pain   . Cellulitis of left lower extremity 05/10/2016  . Prediabetes 01/27/2015  . Obesity   . Primary osteoarthritis of both hips   . OA (osteoarthritis) of hip 10/09/2012  . 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 Hypercholesterolemia   . Rheumatoid arthritis(714.0)     Medications: reviewed   Social Hx:  reports that she quit smoking about 4 years ago. Her smoking use included Cigarettes. She has a 25.00 pack-year smoking history. She has never used smokeless tobacco.   Objective:   BP 128/70   Pulse 69   Temp 98.3 F (36.8 C) (Oral)   Ht 5\' 4"  (1.626 m)   Wt 257 lb (116.6 kg)   SpO2 98%   BMI 44.11 kg/m  Physical Exam  Gen: NAD, alert, cooperative with exam, well-appearing GYN:  External genitalia within normal limits.  Vaginal mucosa pink, moist, normal rugae.   Nonfriable cervix without lesions, no discharge or bleeding noted on speculum exam.  Bimanual exam revealed normal, nongravid uterus.  No cervical motion tenderness. No adnexal masses bilaterally.    Assessment & Plan:   Encounter for Pap smear: Pap smear performed today without difficulty. Normal gynecologic exam. Concern about medication for stress/urge incontinence: Attempt to fill out prior authorization for her Vesicare medication but she does not qualify as she has not failed any previous treatments. Will cancel vesicare and start estrogen cream twice weekly. May need to refer to urology for mixed urinary incontinence if symptoms don't improve in the next 1-2 months. Continue pelvic floor exercises.   Smitty Cords, MD [place], PGY-2

## 2016-07-07 MED ORDER — ESTROGENS, CONJUGATED 0.625 MG/GM VA CREA: 1.0000 | TOPICAL_CREAM | VAGINAL | 1 refills | Status: DC

## 2016-07-07 NOTE — Telephone Encounter (Signed)
Apologize for missing the paper. Unfortunately Vesicare will not be covered. Will change therapy to estrogen cream (see note from yesterday). Called patient to explain this to her. No answer so left voicemail.   Smitty Cords, MD [place], PGY-2

## 2016-07-07 NOTE — Addendum Note (Signed)
Addended by: Carlyle Dolly on: 07/07/2016 08:27 AM   Modules accepted: Orders

## 2016-07-07 NOTE — Telephone Encounter (Signed)
Pt was advised. ep °

## 2016-07-08 LAB — LIPID PANEL
CHOL/HDL RATIO: 7.3 ratio — AB (ref 0.0–4.4)
Cholesterol, Total: 225 mg/dL — ABNORMAL HIGH (ref 100–199)
HDL: 31 mg/dL — AB (ref >39)
LDL CALC: 153 mg/dL — AB (ref 0–99)
TRIGLYCERIDES: 205 mg/dL — AB (ref 0–149)
VLDL Cholesterol Cal: 41 mg/dL — ABNORMAL HIGH (ref 5–40)

## 2016-07-08 LAB — SEDIMENTATION RATE: Sed Rate: 45 mm/hr — ABNORMAL HIGH (ref 0–40)

## 2016-07-08 LAB — RHEUMATOID ARTHRITIS PROFILE: Cyclic Citrullin Peptide Ab: 4 units (ref 0–19)

## 2016-07-08 LAB — C-REACTIVE PROTEIN: CRP: 27.4 mg/L — ABNORMAL HIGH (ref 0.0–4.9)

## 2016-07-08 LAB — ANA: Anti Nuclear Antibody(ANA): POSITIVE — AB

## 2016-07-08 LAB — HEPATITIS C ANTIBODY

## 2016-07-11 ENCOUNTER — Encounter: Payer: Self-pay | Admitting: Family Medicine

## 2016-07-11 ENCOUNTER — Telehealth: Payer: Self-pay | Admitting: Family Medicine

## 2016-07-11 LAB — CYTOLOGY - PAP
Diagnosis: NEGATIVE
HPV: NOT DETECTED

## 2016-07-11 NOTE — Telephone Encounter (Signed)
Pt was returning your call. Pt states she will call you back Thursday around 1:30 when pt is on her lunch break. ep

## 2016-07-11 NOTE — Telephone Encounter (Signed)
Attempted to call patient to discuss lab results. No answer. Left voicemail.   Smitty Cords, MD [place], PGY-3

## 2016-07-14 ENCOUNTER — Telehealth: Payer: Self-pay | Admitting: Family Medicine

## 2016-07-14 NOTE — Telephone Encounter (Signed)
Called patient back. Received voicemail. If she calls back, please ask her when a good time to call her would be. I have also sent her a letter with her results.Thank you.

## 2016-07-14 NOTE — Telephone Encounter (Signed)
Pt would like to talk to dr Juanito Doom about her blood work,.

## 2016-07-14 NOTE — Telephone Encounter (Signed)
Called patient back. No answer. Left voicemail.   Zyrell Carmean, MD [facility], PGY-3  

## 2016-08-21 ENCOUNTER — Encounter: Payer: Self-pay | Admitting: Family Medicine

## 2018-05-13 ENCOUNTER — Ambulatory Visit: Payer: PRIVATE HEALTH INSURANCE | Admitting: Family Medicine

## 2018-05-24 ENCOUNTER — Ambulatory Visit: Payer: PRIVATE HEALTH INSURANCE | Admitting: Family Medicine

## 2018-05-24 ENCOUNTER — Ambulatory Visit: Payer: PRIVATE HEALTH INSURANCE

## 2018-06-04 ENCOUNTER — Other Ambulatory Visit: Payer: Self-pay

## 2018-06-04 ENCOUNTER — Emergency Department (HOSPITAL_COMMUNITY)
Admission: EM | Admit: 2018-06-04 | Discharge: 2018-06-04 | Disposition: A | Discharge status: Home / Self Care | Payer: PRIVATE HEALTH INSURANCE | Attending: Emergency Medicine | Admitting: Emergency Medicine

## 2018-06-04 ENCOUNTER — Emergency Department (HOSPITAL_COMMUNITY): Payer: Self-pay

## 2018-06-04 DIAGNOSIS — Z87891 Personal history of nicotine dependence: Secondary | ICD-10-CM | POA: Insufficient documentation

## 2018-06-04 DIAGNOSIS — R0789 Other chest pain: Secondary | ICD-10-CM | POA: Insufficient documentation

## 2018-06-04 DIAGNOSIS — I10 Essential (primary) hypertension: Secondary | ICD-10-CM | POA: Insufficient documentation

## 2018-06-04 DIAGNOSIS — Z96641 Presence of right artificial hip joint: Secondary | ICD-10-CM | POA: Insufficient documentation

## 2018-06-04 LAB — CBC
HCT: 39.9 % (ref 36.0–46.0)
Hemoglobin: 12.7 g/dL (ref 12.0–15.0)
MCH: 27.2 pg (ref 26.0–34.0)
MCHC: 31.8 g/dL (ref 30.0–36.0)
MCV: 85.4 fL (ref 80.0–100.0)
Platelets: 343 10*3/uL (ref 150–400)
RBC: 4.67 MIL/uL (ref 3.87–5.11)
RDW: 13.9 % (ref 11.5–15.5)
WBC: 8.7 10*3/uL (ref 4.0–10.5)
nRBC: 0 % (ref 0.0–0.2)

## 2018-06-04 LAB — TROPONIN I
Troponin I: <0.03 ng/mL (ref <0.03)
Troponin I: <0.03 ng/mL (ref <0.03)

## 2018-06-04 LAB — BASIC METABOLIC PANEL
Anion gap: 11 (ref 5–15)
BUN: 12 mg/dL (ref 6–20)
CO2: 24 mmol/L (ref 22–32)
Calcium: 9.2 mg/dL (ref 8.9–10.3)
Chloride: 107 mmol/L (ref 98–111)
Creatinine, Ser: 0.87 mg/dL (ref 0.44–1.00)
GFR calc Af Amer: >60 mL/min (ref >60)
GFR calc non Af Amer: >60 mL/min (ref >60)
Glucose, Bld: 104 mg/dL — ABNORMAL HIGH (ref 70–99)
Potassium: 4 mmol/L (ref 3.5–5.1)
Sodium: 142 mmol/L (ref 135–145)

## 2018-06-04 LAB — I-STAT BETA HCG BLOOD, ED (MC, WL, AP ONLY): I-stat hCG, quantitative: <5 m[IU]/mL (ref <5)

## 2018-06-04 MED ORDER — ALUM & MAG HYDROXIDE-SIMETH 200-200-20 MG/5ML PO SUSP
30.0000 mL | Freq: Once | ORAL | Status: AC
  Administered 2018-06-04: 30 mL via ORAL
  Filled 2018-06-04: qty 30

## 2018-06-04 MED ORDER — SODIUM CHLORIDE 0.9% FLUSH: 3.0000 mL | Freq: Once | INTRAVENOUS | Status: DC

## 2018-06-04 MED ORDER — DICLOFENAC SODIUM 75 MG PO TBEC: 75.0000 mg | DELAYED_RELEASE_TABLET | Freq: Two times a day (BID) | ORAL | 0 refills | Status: AC

## 2018-06-04 MED ORDER — KETOROLAC TROMETHAMINE 15 MG/ML IJ SOLN
15.0000 mg | Freq: Once | INTRAMUSCULAR | Status: AC
  Administered 2018-06-04: 14:00:00 15 mg via INTRAVENOUS
  Filled 2018-06-04: qty 1

## 2018-06-04 MED ORDER — ONDANSETRON HCL 4 MG/2ML IJ SOLN
4.0000 mg | Freq: Once | INTRAMUSCULAR | Status: AC
  Administered 2018-06-04: 14:00:00 4 mg via INTRAVENOUS
  Filled 2018-06-04: qty 2

## 2018-06-04 MED ORDER — FAMOTIDINE 20 MG PO TABS: 20.0000 mg | ORAL_TABLET | Freq: Two times a day (BID) | ORAL | 0 refills | Status: DC

## 2018-06-04 NOTE — ED Provider Notes (Signed)
[facility] EMERGENCY DEPARTMENT Provider Note   CSN: [id number] Arrival date & time: 06/04/18  1126    History   Chief Complaint Chief Complaint  Patient presents with  . Chest Pain    HPI Alyssa Rangel is a 53 y.o. female.     HPI Patient presents concern of chest pain. Sensation is pressure-like across the superior anterior thorax. 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 She notes that she has had more pain in the past, under similar circumstances.  When she denies history of cardiac disease, takes no medication regularly, does not smoke.  She drinks alcohol only socially.  Since onset symptoms been persistent, with no clear alleviating or exacerbating factors.    Past Medical History:  Diagnosis Date  . Anemia   . Cholecystitis, acute 11/02/2012  . Hearing loss   . Hip pain    pt states no cartilage in right hip very painful  . History of body piercing    tongue ring  . Hypercholesterolemia    prior medication therapy  . Hypertension    due to diet pills in the past, never on meds; normotensive as of 7/14  . Polyarthralgia   . Rheumatoid arthritis(714.0)    patient reported history  . Routine gynecological examination    Dr. Everett Graff, [facility]  . 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 Joint pain 06/16/2016  . Encounter for annual health examination 06/16/2016  . Urinary incontinence 06/16/2016  . Left leg pain   . Cellulitis of left lower extremity 05/10/2016  . Prediabetes 01/27/2015  . Obesity   . Primary osteoarthritis of both hips   . OA (osteoarthritis) of hip 10/09/2012  . 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 Hypercholesterolemia    . Rheumatoid arthritis(714.0)     Past Surgical History:  Procedure Laterality Date  . BILATERAL SALPINGECTOMY  02/06/2012   Procedure: BILATERAL SALPINGECTOMY;  Surgeon: Delice Lesch, MD;  Location: [facility];  Service: Gynecology;  Laterality: Bilateral;  partial bilateral salpingectomy  . CHOLECYSTECTOMY N/A 10/31/2012   Procedure: LAPAROSCOPIC CHOLECYSTECTOMY WITH INTRAOPERATIVE CHOLANGIOGRAM;  Surgeon: Edward Jolly, MD;  Location: [facility];  Service: General;  Laterality: N/A;  . CYSTOSCOPY  02/06/2012   Procedure: CYSTOSCOPY;  Surgeon: Delice Lesch, MD;  Location: [facility];  Service: Gynecology;  Laterality: N/A;  . DILITATION & CURRETTAGE/HYSTROSCOPY WITH NOVASURE ABLATION  02/06/2012   Procedure: DILATATION & CURETTAGE/HYSTEROSCOPY WITH NOVASURE ABLATION;  Surgeon: Delice Lesch, MD;  Location: [facility];  Service: Gynecology;  Laterality: N/A;   novasure  . LAPAROSCOPY  02/06/2012   Procedure: LAPAROSCOPY OPERATIVE;  Surgeon: Delice Lesch, MD;  Location: [facility];  Service: Gynecology;  Laterality: N/A;  . OVARIAN CYST REMOVAL  02/06/2012   Procedure: OVARIAN CYSTECTOMY;  Surgeon: Delice Lesch, MD;  Location: [facility];  Service: Gynecology;  Laterality: Bilateral;  . TOTAL HIP ARTHROPLASTY Right 10/09/2012   Procedure: RIGHT TOTAL HIP ARTHROPLASTY;  Surgeon: Gearlean Alf, MD;  Location: [facility];  Service: Orthopedics;  Laterality: Right;     OB History    Gravida  0   Para      Term  Preterm      AB      Living        SAB      TAB      Ectopic      Multiple      Live Births               Home Medications    Prior to Admission medications   Medication Sig Start Date End Date Taking? Authorizing Provider  albuterol (PROVENTIL HFA;VENTOLIN HFA) 108 (90 BASE) MCG/ACT inhaler Inhale 1-2 puffs into the lungs every 6 (six) hours as needed for wheezing or shortness of breath. Patient not taking: Reported on 06/16/2016 10/29/14   Dowless, Aldona Bar  Tripp, PA-C  conjugated estrogens (PREMARIN) vaginal cream Place 1 Applicatorful vaginally 2 (two) times a week. Patient not taking: Reported on 06/04/2018 07/10/16   Carlyle Dolly, MD  diclofenac (VOLTAREN) 75 MG EC tablet Take 1 tablet (75 mg total) by mouth 2 (two) times daily. Patient not taking: Reported on 06/04/2018 06/16/16   Carlyle Dolly, MD  traMADol (ULTRAM) 50 MG tablet Take 1 tablet (50 mg total) by mouth every 6 (six) hours as needed. Patient not taking: Reported on 06/04/2018 05/15/16   Marina Goodell A, MD  triamcinolone ointment (KENALOG) 0.5 % Apply topically 2 (two) times daily. Patient not taking: Reported on 06/16/2016 05/12/16   Everrett Coombe, MD    Family History Family History  Problem Relation Age of Onset  . Diabetes Mother        insulin dependent  . Hypertension Mother   . Other Father        unknown  . Multiple sclerosis Sister   . Heart disease Maternal Grandmother   . Diabetes Maternal Grandmother   . Hypertension Maternal Grandmother   . Arthritis Maternal Grandmother   . Cancer Neg Hx   . Stroke Neg Hx     Social History Social History   Tobacco Use  . Smoking status: Former Smoker    Packs/day: 1.00    Years: 25.00    Pack years: 25.00    Types: Cigarettes    Last attempt to quit: 10/02/2011    Years since quitting: 6.6  . Smokeless tobacco: Never Used  Substance Use Topics  . Alcohol use: Yes    Alcohol/week: 1.0 standard drinks    Types: 1 Glasses of wine per week    Comment: occ.  . Drug use: No     Allergies   Patient has no known allergies.   Review of Systems Review of Systems  Constitutional:       Per HPI, otherwise negative  HENT:       Per HPI, otherwise negative  Respiratory:       Per HPI, otherwise negative  Cardiovascular:       Per HPI, otherwise negative  Gastrointestinal: Negative for vomiting.  Endocrine:       Negative aside from HPI  Genitourinary:       Neg aside from HPI   Musculoskeletal:        Per HPI, otherwise negative  Skin: Negative.   Neurological: Negative for syncope.     Physical Exam Updated Vital Signs BP (!) 123/58   Pulse 72   Temp 98.1 F (36.7 C) (Oral)   Resp 14   SpO2 94%   Physical Exam Vitals signs and nursing note reviewed.  Constitutional:      General: She is not in acute distress.    Appearance:  She is well-developed.  HENT:     Head: Normocephalic and atraumatic.  Eyes:     Conjunctiva/sclera: Conjunctivae normal.  Cardiovascular:     Rate and Rhythm: Normal rate and regular rhythm.  Pulmonary:     Effort: Pulmonary effort is normal. No respiratory distress.     Breath sounds: Normal breath sounds. No stridor.  Chest:     Chest wall: Tenderness present.  Abdominal:     General: There is no distension.  Skin:    General: Skin is warm and dry.  Neurological:     Mental Status: She is alert and oriented to person, place, and time.     Cranial Nerves: No cranial nerve deficit.      ED Treatments / Results  Labs (all labs ordered are listed, but only abnormal results are displayed) Labs Reviewed  BASIC METABOLIC PANEL - Abnormal; Notable for the following components:      Result Value   Glucose, Bld 104 (*)    All other components within normal limits  CBC  TROPONIN I  TROPONIN I  I-STAT BETA HCG BLOOD, ED ([facility], [facility], [facility] ONLY)    EKG EKG Interpretation  Date/Time:  Tuesday Jun 04 2018 11:30:38 EDT Ventricular Rate:  77 PR Interval:  154 QRS Duration: 76 QT Interval:  402 QTC Calculation: 454 R Axis:   37 Text Interpretation:  Normal sinus rhythm Nonspecific T wave abnormality Abnormal ECG Confirmed by Carmin Muskrat [id number]) on 06/04/2018 1:19:48 PM Also confirmed by Carmin Muskrat ([id number]), editor Philomena Doheny [id number])  on 06/04/2018 1:51:06 PM   Radiology Dg Chest 2 View  Result Date: 06/04/2018 CLINICAL DATA:  Chest pain. EXAM: CHEST - 2 VIEW COMPARISON:  Radiographs of October 29, 2014. FINDINGS: The heart size and  mediastinal contours are within normal limits. No pneumothorax or pleural effusion is noted. Mild bibasilar subsegmental atelectasis or scarring is noted. The visualized skeletal structures are unremarkable. IMPRESSION: Mild bibasilar subsegmental atelectasis or scarring. Electronically Signed   By: Marijo Conception M.D.   On: 06/04/2018 12:28    Procedures Procedures (including critical care time)  Medications Ordered in ED Medications  sodium chloride flush (NS) 0.9 % injection 3 mL (has no administration in time range)  ketorolac (TORADOL) 15 MG/ML injection 15 mg (15 mg Intravenous Given 06/04/18 1353)  ondansetron (ZOFRAN) injection 4 mg (4 mg Intravenous Given 06/04/18 1353)  alum & mag hydroxide-simeth (MAALOX/MYLANTA) 200-200-20 MG/5ML suspension 30 mL (30 mLs Oral Given 06/04/18 1353)     Initial Impression / Assessment and Plan / ED Course  I have reviewed the triage vital signs and the nursing notes.  Pertinent labs & imaging results that were available during my care of the patient were reviewed by me and considered in my medical decision making (see chart for details).    On repeat exam the patient is awake, alert, in no distress. We discussed initial findings, including reassuring first troponin.    4:42 PM On repeat exam, patient is in no distress, hemodynamically unremarkable. Now, with 2 normal troponins, nonischemic EKG, unremarkable x-ray, and reproducible chest tenderness, there is low suspicion for atypical ACS, high suspicion for musculoskeletal etiology for her chest pain. Patient will start a course of anti-inflammatories, will follow-up with primary care.  Final Clinical Impressions(s) / ED Diagnoses   Final diagnoses:  Atypical chest pain    ED Discharge Orders         Ordered    diclofenac (VOLTAREN) 75 MG EC tablet  2 times daily     06/04/18 1643    famotidine (PEPCID) 20 MG tablet  2 times daily     06/04/18 1643           Carmin Muskrat, MD  06/04/18 [phone]

## 2018-06-04 NOTE — ED Triage Notes (Signed)
Cp x 1 hoiur had some sob no vovmiting no nausea

## 2018-06-04 NOTE — Discharge Instructions (Signed)
As discussed, your evaluation today has been largely reassuring.  But, it is important that you monitor your condition carefully, and do not hesitate to return to the ED if you develop new, or concerning changes in your condition. ? ?Otherwise, please follow-up with your physician for appropriate ongoing care. ? ?

## 2019-02-28 ENCOUNTER — Encounter (HOSPITAL_COMMUNITY): Payer: Self-pay

## 2019-02-28 ENCOUNTER — Emergency Department (HOSPITAL_COMMUNITY)
Admission: EM | Admit: 2019-02-28 | Discharge: 2019-02-28 | Disposition: A | Discharge status: Home / Self Care | Payer: Worker's Compensation | Attending: Emergency Medicine | Admitting: Emergency Medicine

## 2019-02-28 ENCOUNTER — Other Ambulatory Visit: Payer: Self-pay

## 2019-02-28 DIAGNOSIS — Y939 Activity, unspecified: Secondary | ICD-10-CM | POA: Insufficient documentation

## 2019-02-28 DIAGNOSIS — W504XXA Accidental scratch by another person, initial encounter: Secondary | ICD-10-CM | POA: Diagnosis not present

## 2019-02-28 DIAGNOSIS — I10 Essential (primary) hypertension: Secondary | ICD-10-CM | POA: Diagnosis not present

## 2019-02-28 DIAGNOSIS — Y999 Unspecified external cause status: Secondary | ICD-10-CM | POA: Diagnosis not present

## 2019-02-28 DIAGNOSIS — Y92129 Unspecified place in nursing home as the place of occurrence of the external cause: Secondary | ICD-10-CM | POA: Diagnosis not present

## 2019-02-28 DIAGNOSIS — Z87891 Personal history of nicotine dependence: Secondary | ICD-10-CM | POA: Insufficient documentation

## 2019-02-28 DIAGNOSIS — S0081XA Abrasion of other part of head, initial encounter: Secondary | ICD-10-CM | POA: Diagnosis not present

## 2019-02-28 DIAGNOSIS — S0993XA Unspecified injury of face, initial encounter: Secondary | ICD-10-CM | POA: Diagnosis present

## 2019-02-28 MED ORDER — FLUORESCEIN SODIUM 1 MG OP STRP
1.0000 | ORAL_STRIP | Freq: Once | OPHTHALMIC | Status: AC
  Administered 2019-02-28: 1 via OPHTHALMIC
  Filled 2019-02-28: qty 1

## 2019-02-28 MED ORDER — IBUPROFEN 800 MG PO TABS
800.0000 mg | ORAL_TABLET | Freq: Once | ORAL | Status: AC
  Administered 2019-02-28: 800 mg via ORAL
  Filled 2019-02-28: qty 1

## 2019-02-28 MED ORDER — TETRACAINE HCL 0.5 % OP SOLN
2.0000 [drp] | Freq: Once | OPHTHALMIC | Status: AC
  Administered 2019-02-28: 2 [drp] via OPHTHALMIC
  Filled 2019-02-28: qty 4

## 2019-02-28 NOTE — Discharge Instructions (Addendum)
Keep the area clean and dry.  Return here as needed.  Follow-up with your doctor as needed.  Tylenol and Motrin for any pain.
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
## 2019-03-14 ENCOUNTER — Emergency Department (HOSPITAL_COMMUNITY): Payer: Self-pay

## 2019-03-14 ENCOUNTER — Other Ambulatory Visit: Payer: Self-pay

## 2019-03-14 ENCOUNTER — Encounter (HOSPITAL_COMMUNITY): Payer: Self-pay | Admitting: *Deleted

## 2019-03-14 ENCOUNTER — Emergency Department (HOSPITAL_COMMUNITY)
Admission: EM | Admit: 2019-03-14 | Discharge: 2019-03-14 | Disposition: A | Discharge status: Home / Self Care | Payer: Self-pay | Attending: Emergency Medicine | Admitting: Emergency Medicine

## 2019-03-14 DIAGNOSIS — Z79899 Other long term (current) drug therapy: Secondary | ICD-10-CM | POA: Insufficient documentation

## 2019-03-14 DIAGNOSIS — M25559 Pain in unspecified hip: Secondary | ICD-10-CM

## 2019-03-14 DIAGNOSIS — M25551 Pain in right hip: Secondary | ICD-10-CM | POA: Insufficient documentation

## 2019-03-14 MED ORDER — TRAMADOL HCL 50 MG PO TABS: 50.0000 mg | ORAL_TABLET | Freq: Four times a day (QID) | ORAL | 0 refills | Status: DC | PRN

## 2019-03-14 MED ORDER — METHOCARBAMOL 500 MG PO TABS
500.0000 mg | ORAL_TABLET | Freq: Once | ORAL | Status: AC
  Administered 2019-03-14: 500 mg via ORAL
  Filled 2019-03-14: qty 1

## 2019-03-14 MED ORDER — METHOCARBAMOL 500 MG PO TABS: 500.0000 mg | ORAL_TABLET | Freq: Three times a day (TID) | ORAL | 0 refills | Status: DC | PRN

## 2019-03-14 NOTE — ED Provider Notes (Signed)
[facility] EMERGENCY DEPARTMENT Provider Note   CSN: [id number] Arrival date & time: 03/14/19  [id number]     History Chief Complaint  Patient presents with  . Back Pain    Alyssa Rangel is a 54 y.o. female.  HPI Patient presents with right hip pain after the back and down to the knee.  Began a couple weeks ago.  Has had previous hip replacement on the right around 6 years ago.  No fevers no trauma.  Patient states the pain is worse with movement and goes into her pelvis.  States it feels like the pain she was having before she had to have the hip replaced.  No trauma.  No weakness but states the leg will sometimes give out.  No dysuria.  Has taken over-the-counter medicines without relief.    Past Medical History:  Diagnosis Date  . Anemia   . Cholecystitis, acute 11/02/2012  . Hearing loss   . Hip pain    pt states no cartilage in right hip very painful  . History of body piercing    tongue ring  . Hypercholesterolemia    prior medication therapy  . Hypertension    due to diet pills in the past, never on meds; normotensive as of 7/14  . Polyarthralgia   . Rheumatoid arthritis(714.0)    patient reported history  . Routine gynecological examination    Dr. Everett Graff, [facility]  . 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 Joint pain 06/16/2016  . Encounter for annual health examination 06/16/2016  . Urinary incontinence 06/16/2016  . Left leg pain   . Cellulitis of left lower extremity 05/10/2016  . Prediabetes 01/27/2015  . Obesity   . Primary osteoarthritis of both hips   . OA (osteoarthritis) of hip 10/09/2012  . 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 Hypercholesterolemia   . Rheumatoid arthritis(714.0)     Past Surgical History:  Procedure Laterality Date  . BILATERAL  SALPINGECTOMY  02/06/2012   Procedure: BILATERAL SALPINGECTOMY;  Surgeon: Delice Lesch, MD;  Location: [facility];  Service: Gynecology;  Laterality: Bilateral;  partial bilateral salpingectomy  . CHOLECYSTECTOMY N/A 10/31/2012   Procedure: LAPAROSCOPIC CHOLECYSTECTOMY WITH INTRAOPERATIVE CHOLANGIOGRAM;  Surgeon: Edward Jolly, MD;  Location: [facility];  Service: General;  Laterality: N/A;  . CYSTOSCOPY  02/06/2012   Procedure: CYSTOSCOPY;  Surgeon: Delice Lesch, MD;  Location: [facility];  Service: Gynecology;  Laterality: N/A;  . DILITATION & CURRETTAGE/HYSTROSCOPY WITH NOVASURE ABLATION  02/06/2012   Procedure: DILATATION & CURETTAGE/HYSTEROSCOPY WITH NOVASURE ABLATION;  Surgeon: Delice Lesch, MD;  Location: [facility];  Service: Gynecology;  Laterality: N/A;   novasure  . LAPAROSCOPY  02/06/2012   Procedure: LAPAROSCOPY OPERATIVE;  Surgeon: Delice Lesch, MD;  Location: [facility];  Service: Gynecology;  Laterality: N/A;  . OVARIAN CYST REMOVAL  02/06/2012   Procedure: OVARIAN CYSTECTOMY;  Surgeon: Delice Lesch, MD;  Location: [facility];  Service: Gynecology;  Laterality: Bilateral;  . TOTAL HIP ARTHROPLASTY Right 10/09/2012   Procedure: RIGHT TOTAL HIP ARTHROPLASTY;  Surgeon: Gearlean Alf, MD;  Location: [facility];  Service: Orthopedics;  Laterality: Right;     OB History    Gravida  0   Para      Term  Preterm      AB      Living        SAB      TAB      Ectopic      Multiple      Live Births              Family History  Problem Relation Age of Onset  . Diabetes Mother        insulin dependent  . Hypertension Mother   . Other Father        unknown  . Multiple sclerosis Sister   . Heart disease Maternal Grandmother   . Diabetes Maternal Grandmother   . Hypertension Maternal Grandmother   . Arthritis Maternal Grandmother   . Cancer Neg Hx   . Stroke Neg Hx     Social History   Tobacco Use  . Smoking status: Former Smoker    Packs/day: 1.00     Years: 25.00    Pack years: 25.00    Types: Cigarettes    Quit date: 10/02/2011    Years since quitting: 7.4  . Smokeless tobacco: Never Used  Substance Use Topics  . Alcohol use: Yes    Alcohol/week: 1.0 standard drinks    Types: 1 Glasses of wine per week    Comment: occ.  . Drug use: No    Home Medications Prior to Admission medications   Medication Sig Start Date End Date Taking? Authorizing Provider  albuterol (PROVENTIL HFA;VENTOLIN HFA) 108 (90 BASE) MCG/ACT inhaler Inhale 1-2 puffs into the lungs every 6 (six) hours as needed for wheezing or shortness of breath. Patient not taking: Reported on 06/16/2016 10/29/14   Dowless, Aldona Bar Tripp, PA-C  conjugated estrogens (PREMARIN) vaginal cream Place 1 Applicatorful vaginally 2 (two) times a week. Patient not taking: Reported on 06/04/2018 07/10/16   Carlyle Dolly, MD  famotidine (PEPCID) 20 MG tablet Take 1 tablet (20 mg total) by mouth 2 (two) times daily. Take one tablet twice daily for two days 06/04/18   Carmin Muskrat, MD  methocarbamol (ROBAXIN) 500 MG tablet Take 1 tablet (500 mg total) by mouth every 8 (eight) hours as needed for muscle spasms. 03/14/19   Davonna Belling, MD  traMADol (ULTRAM) 50 MG tablet Take 1 tablet (50 mg total) by mouth every 6 (six) hours as needed. 03/14/19   Davonna Belling, MD  triamcinolone ointment (KENALOG) 0.5 % Apply topically 2 (two) times daily. Patient not taking: Reported on 06/16/2016 05/12/16   Everrett Coombe, MD    Allergies    Patient has no known allergies.  Review of Systems   Review of Systems  Constitutional: Negative for appetite change.  HENT: Negative for congestion.   Eyes: Negative for visual disturbance.  Respiratory: Negative for shortness of breath.   Cardiovascular: Negative for chest pain.  Gastrointestinal: Negative for abdominal pain.  Musculoskeletal: Positive for back pain.       Right hip right knee pain.  Psychiatric/Behavioral: Negative for confusion.     Physical Exam Updated Vital Signs BP 126/64 (BP Location: Left Arm)   Pulse 71   Temp 98.7 F (37.1 C) (Oral)   Resp 18   Ht 5\' 5"  (1.651 m)   Wt 113.4 kg   SpO2 100%   BMI 41.60 kg/m   Physical Exam Vitals reviewed.  HENT:     Head: Normocephalic.  Abdominal:     Tenderness: There is no abdominal tenderness.  Musculoskeletal:     Comments: Some tenderness  over right hip laterally.  Somewhat decreased range of motion.  Pain in hip and pelvis with flexion at the hip.  No pain with gentle rotation of the lower extremity.  Some lateral posterior hip tenderness.  No midline lumbar tenderness.  Skin:    General: Skin is warm.  Neurological:     Mental Status: She is alert.     Comments: Sensation and strength grossly intact in bilateral lower extremities     ED Results / Procedures / Treatments   Labs (all labs ordered are listed, but only abnormal results are displayed) Labs Reviewed - No data to display  EKG None  Radiology DG Hip Unilat W or Wo Pelvis 2-3 Views Right  Result Date: 03/14/2019 CLINICAL DATA:  Progressive right posterior hip pain radiating down the leg and into the lumbar spine. EXAM: DG HIP (WITH OR WITHOUT PELVIS) 2-3V RIGHT COMPARISON:  Radiographs dated 10/01/2012 FINDINGS: Right total hip prosthesis appears in good position with no evidence of loosening. No fracture. Dystrophic calcifications around the hip to the expected degree. No acute bone abnormalities. Chronic degenerative facet arthritis in the lower lumbar spine. IMPRESSION: No acute abnormality. Right total hip prosthesis appears in good position. Electronically Signed   By: Lorriane Shire M.D.   On: 03/14/2019 11:10    Procedures Procedures (including critical care time)  Medications Ordered in ED Medications  methocarbamol (ROBAXIN) tablet 500 mg (500 mg Oral Given 03/14/19 1152)    ED Course  I have reviewed the triage vital signs and the nursing notes.  Pertinent labs & imaging  results that were available during my care of the patient were reviewed by me and considered in my medical decision making (see chart for details).    MDM Rules/Calculators/A&P                      Patient with right hip pain.  Previous hip replacement.  Tenderness and hip pain.  Does not appear to be lumbar radiating down appears to be more from the hip.  X-ray reassuring.  Will treat symptomatically.  Discharge home. Final Clinical Impression(s) / ED Diagnoses Final diagnoses:  Hip pain    Rx / DC Orders ED Discharge Orders         Ordered    methocarbamol (ROBAXIN) 500 MG tablet  Every 8 hours PRN     03/14/19 1156    traMADol (ULTRAM) 50 MG tablet  Every 6 hours PRN     03/14/19 1156           Davonna Belling, MD 03/14/19 1520

## 2019-03-14 NOTE — ED Triage Notes (Signed)
C/o lower back pain with radiation down right leg into her knee. Onset last week. No hisory of injury.

## 2019-04-02 IMAGING — CT CT TIBIA FIBULA *L* W/ CM
3 of 4 series · 9 of 33 positions shown, 11 images · IV contrast (agent unspecified)
Comparison: 05/10/2016

CONTRAST:  100mL DSIHT2-722 IOPAMIDOL (DSIHT2-722) INJECTION 61%

CLINICAL DATA: Swelling and rash in the lower leg

EXAM:
CT OF THE LOWER RIGHT EXTREMITY WITH CONTRAST
TECHNIQUE: Multidetector CT imaging of the lower right extremity was performed
according to the standard protocol following intravenous contrast
administration.

[Series 4: lfov ext 3.0 b40s · axial · 0.39mm/px · z∈[-32,+256]mm · 3 of 157 slices shown, 4 images]
[im 37/157  soft-tissue]
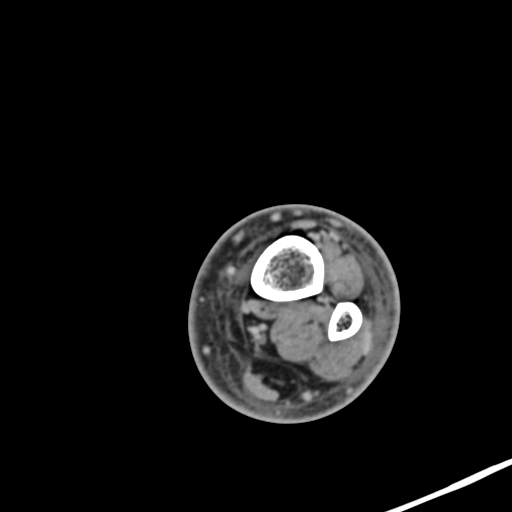
[im 37/157  bone]
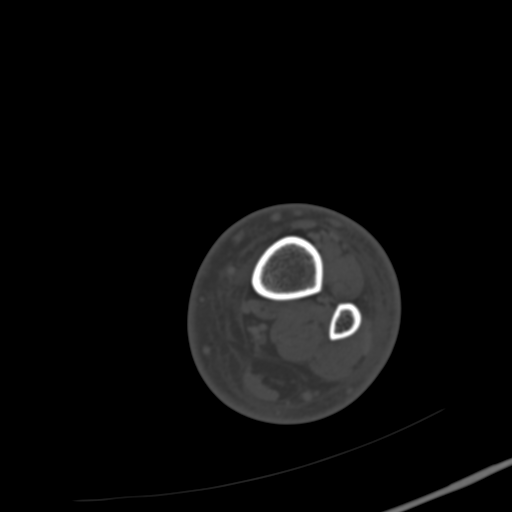
[im 85/157  bone]
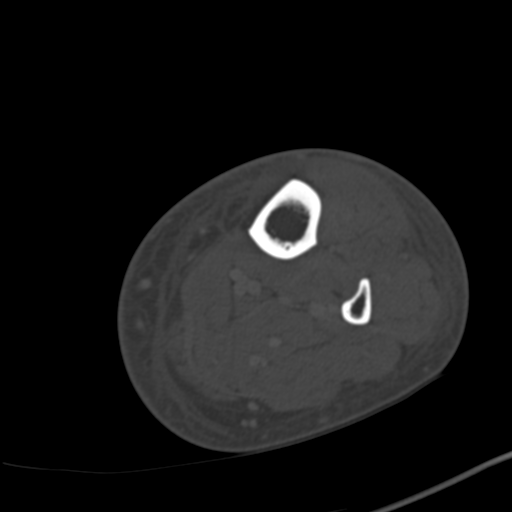
[im 133/157  bone]
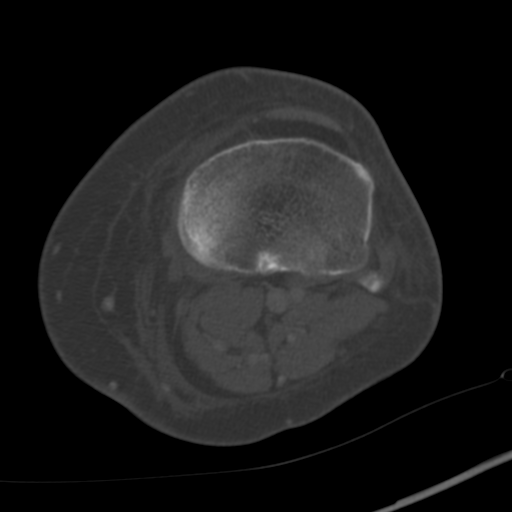

[Series 5: coronal bone · coronal · 0.49mm/px · 1 of 86 slices shown]
[im 43/86  bone]
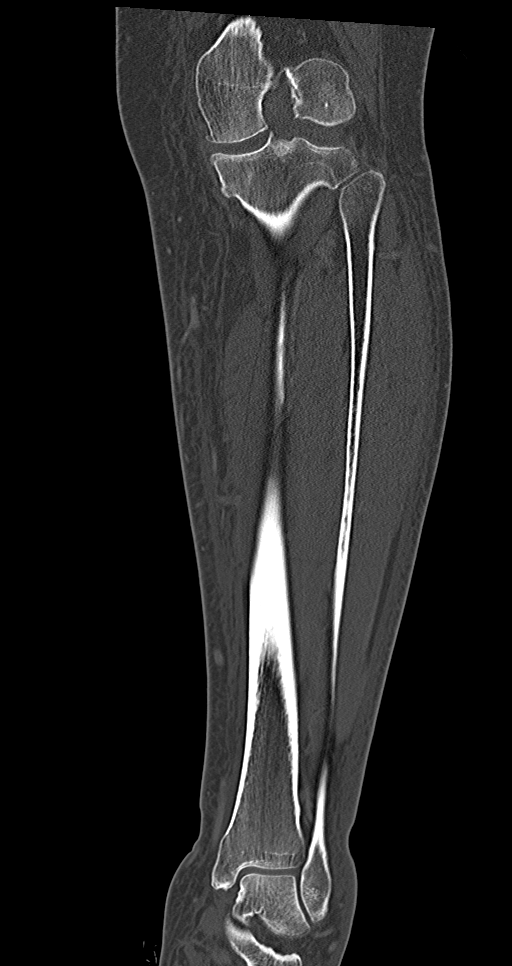

[Series 8: sagittalsoft tissue · sagittal · 0.39mm/px · 5 of 66 slices shown, 6 images]
[im 22/66  bone]
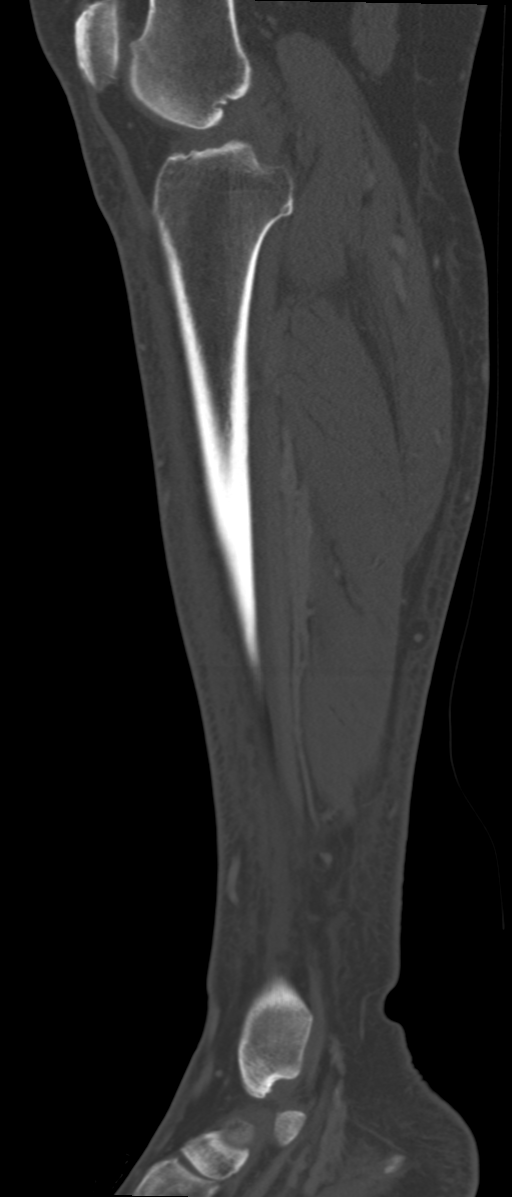
[im 28/66  bone]
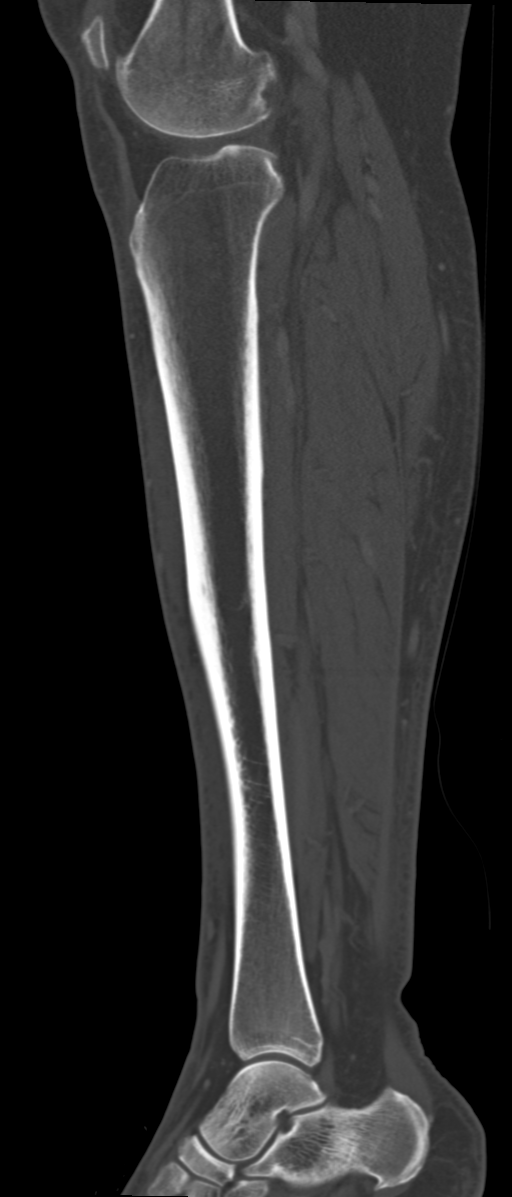
[im 33/66  soft-tissue]
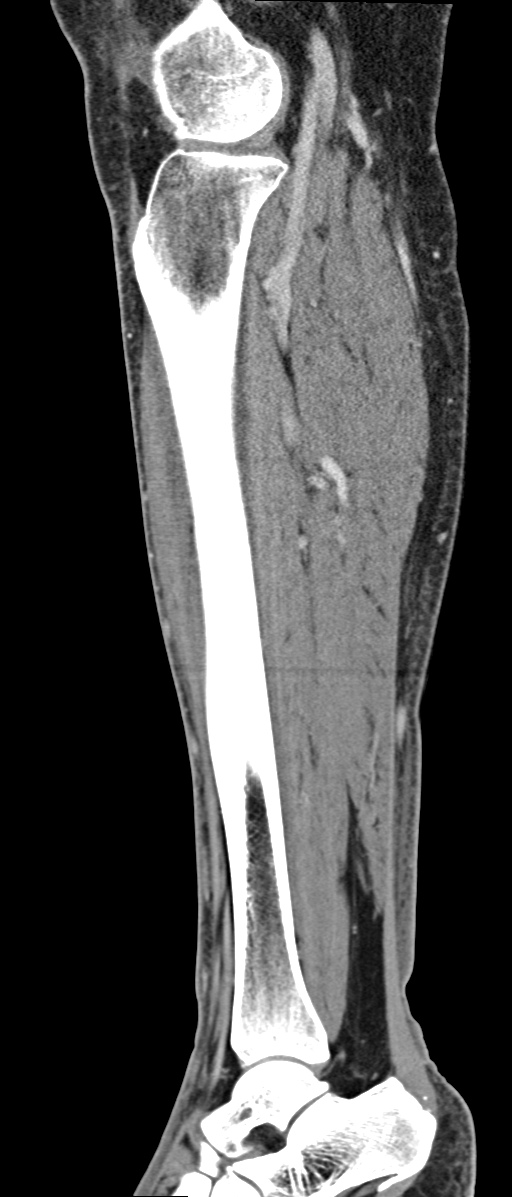
[im 33/66  bone]
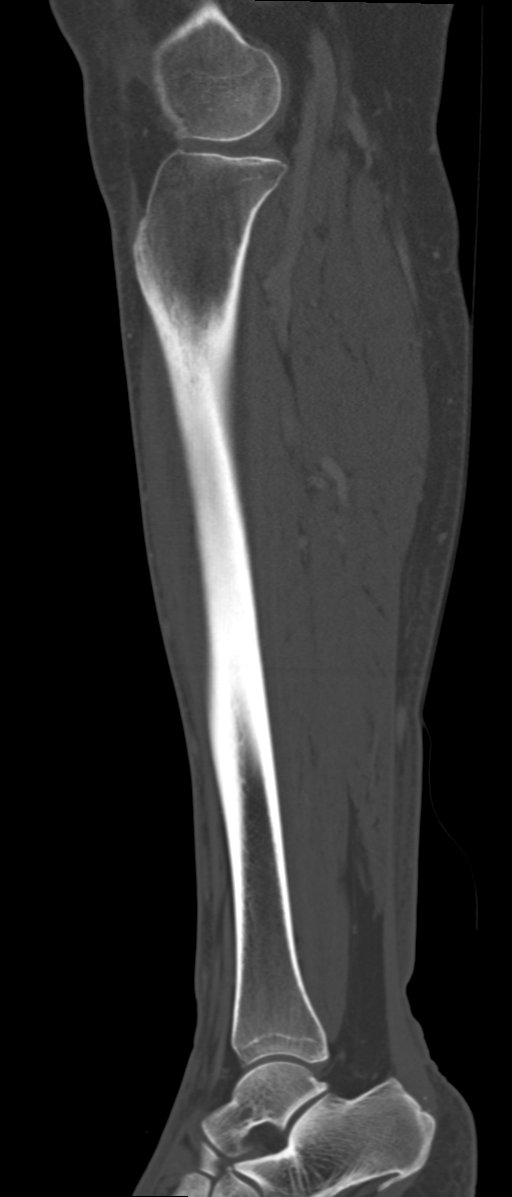
[im 38/66  bone]
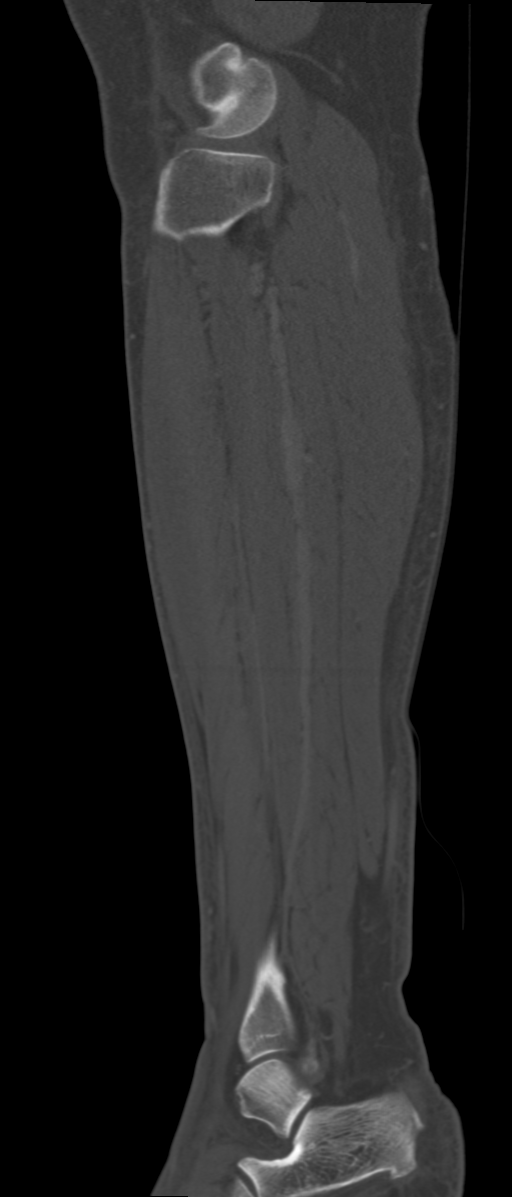
[im 44/66  bone]
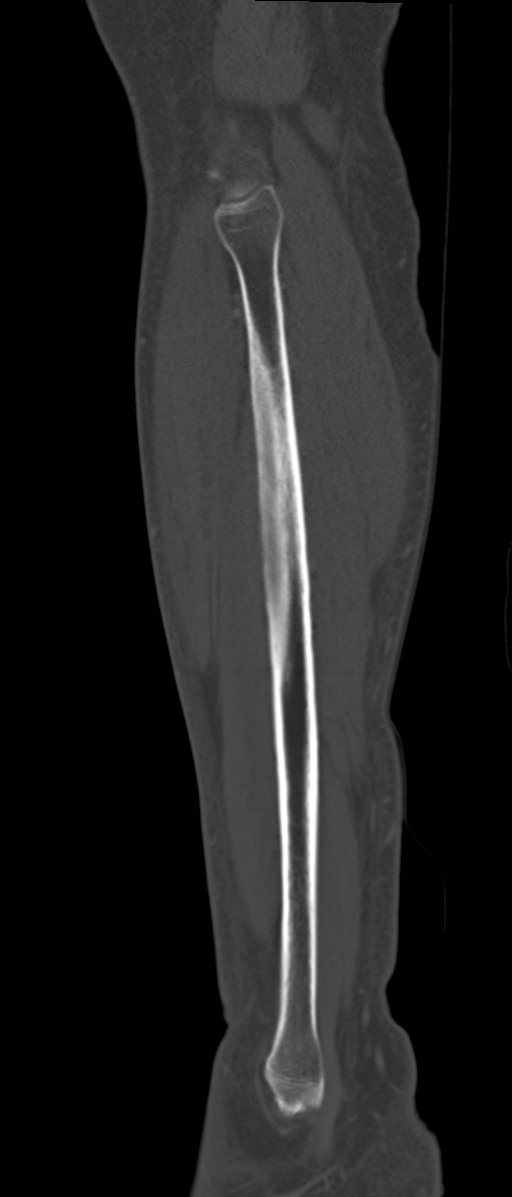

[9 of 33 positions shown; findings below may reference images not displayed]

FINDINGS: Bones/Joint/Cartilage

No fracture or malalignment. Mild degenerative changes at the medial
and lateral compartments of the knee with small bony spurring
visible. No large effusion at the visualized portions of the knee.
No periostitis or bone destruction.

Ligaments

Suboptimally assessed by CT.

Muscles and Tendons

Achilles tendon is grossly intact. Medial and lateral tendons are
grossly normal in position. Normal muscle bulk.

Soft tissues

Normal vascular enhancement. Moderate diffuse subcutaneous edema
throughout the lower leg. Skin thickening distally. No rim enhancing
fluid collections. No soft tissue gas.
IMPRESSION: 1. Moderate diffuse subcutaneous edema and skin thickening
consistent with cellulitis. No rim enhancing fluid collections to
suggest an abscess.
2. No acute osseous abnormality.

## 2019-04-18 ENCOUNTER — Ambulatory Visit (HOSPITAL_COMMUNITY)
Admission: RE | Admit: 2019-04-18 | Discharge: 2019-04-18 | Disposition: A | Discharge status: Home / Self Care | Payer: Self-pay | Source: Ambulatory Visit | Attending: Family Medicine | Admitting: Family Medicine

## 2019-04-18 ENCOUNTER — Telehealth: Payer: Self-pay

## 2019-04-18 ENCOUNTER — Encounter: Payer: Self-pay | Admitting: Family Medicine

## 2019-04-18 ENCOUNTER — Telehealth: Payer: Self-pay | Admitting: Family Medicine

## 2019-04-18 ENCOUNTER — Other Ambulatory Visit: Payer: Self-pay

## 2019-04-18 ENCOUNTER — Ambulatory Visit (INDEPENDENT_AMBULATORY_CARE_PROVIDER_SITE_OTHER): Payer: No Typology Code available for payment source | Admitting: Family Medicine

## 2019-04-18 VITALS — BP 145/80 | HR 83 | Ht 65.0 in | Wt 264.2 lb

## 2019-04-18 DIAGNOSIS — R7303 Prediabetes: Secondary | ICD-10-CM

## 2019-04-18 DIAGNOSIS — M25561 Pain in right knee: Secondary | ICD-10-CM

## 2019-04-18 DIAGNOSIS — R030 Elevated blood-pressure reading, without diagnosis of hypertension: Secondary | ICD-10-CM | POA: Diagnosis not present

## 2019-04-18 DIAGNOSIS — R600 Localized edema: Secondary | ICD-10-CM

## 2019-04-18 LAB — POCT GLYCOSYLATED HEMOGLOBIN (HGB A1C): HbA1c, POC (controlled diabetic range): 7 % (ref 0.0–7.0)

## 2019-04-18 MED ORDER — MELOXICAM 7.5 MG PO TABS: 7.5000 mg | ORAL_TABLET | Freq: Every day | ORAL | 0 refills | Status: DC

## 2019-04-18 MED ORDER — BACLOFEN 10 MG PO TABS: 10.0000 mg | ORAL_TABLET | Freq: Three times a day (TID) | ORAL | 0 refills | Status: DC

## 2019-04-18 NOTE — Telephone Encounter (Signed)
Called with results, Baker's cyst on imaging. Discussed management. Rx for Meloxicam and baclofen sent to pharmacy.  X-ray of knee ordered. If pain persists, recommend appointment for potential steroid injection.  [name], MD  Family Medicine Teaching Service

## 2019-04-18 NOTE — Telephone Encounter (Signed)
Called with results--see separate encounter.

## 2019-04-18 NOTE — Telephone Encounter (Signed)
Alyssa Rangel, from [facility], calls nurse line to give report. Negative DVT in leg, however possible ruptured bakers cyst behind knee.

## 2019-04-18 NOTE — Progress Notes (Addendum)
    SUBJECTIVE:   CHIEF COMPLAINT / HPI: right leg pain and swelling  [place] NAYA STRANO is a pleasant 25 year with history of prediabetes, HTN, polyarthralgia, presenting for evaluation of check up and leg pain.  The patient reports a several week history of right pain, now with edema. She was seen in the ED for hip pain, given Robaxin and Tramadol. She has been able to ambulate and work. Edema has worsened. Edema and pain unchanged at night. No falls, trauma, history of VTE, no fevers, chills, chest pain, or dyspnea. No family history of VTE. No recent travel. Does not take HRT. Her hip replacement was several years ago and works well for her.   The patient reports some stress related to her pain and swelling. She has been worried about a clot or another injury. 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   Drinks 1-2 glasses of wine- at most- per week Non smoker    OBJECTIVE:   BP (!) 145/80   Pulse 83   Ht 5\' 5"  (1.651 m)   Wt 264 lb 3.2 oz (119.8 kg)   SpO2 96%   BMI 43.97 kg/m   HEENT: Sclera anicteric. Dentition is moderate. Appears well hydrated. Neck: Supple Cardiac: Regular rate and rhythm. Normal S1/S2. No murmurs, rubs, or gallops appreciated. Lungs: Clear bilaterally to ascultation.  Extremities: Right leg is slightly warmer than left 3+ edema to knee on right 1+ to mid calf on left No erythema Knee with full extension Limited flexion due to posterior fossa edema  Psych: tearful, appropriate  PHQ 2 was 0 Denies low mood--upset today that her leg continues to cause issues    ASSESSMENT/PLAN:   Prediabetes A1C elevated today. This is first in diabetic range, as only one value in diabetic range will repeat in 3 months. Does not meet diagnostic criteria yet.  Has had weight  gain-slowly-over past 2 years Will discuss dietary changes, repeat in 3 months. BMP today. Consider starting metformin.     Right lower extremity pain and swelling, concern for DVT, other items to consider include cellulitis (no fever), OA with concominant Baker's cyst also possible. Right lower extremity duplex ordered CBC and BMP ordered If negative, will plan for knee x-ray, further evaluation  If DVT ultrasound negative, will prescribe baclofen given benefit from robaxin  HCM Due for CSY and Mammogram--- will discuss further at follow up    [name], [place]

## 2019-04-18 NOTE — Patient Instructions (Addendum)
It was wonderful to see you today.  Please bring ALL of your medications with you to every visit.   Thank you for choosing [facility].   Please call [phone] with any questions about today's appointment.  Please be sure to schedule follow up at the front  desk before you leave today.   [name], MD  Family Medicine   I will call you with results of your ultrasound of your leg TODAY

## 2019-04-18 NOTE — Assessment & Plan Note (Signed)
A1C elevated today. This is first in diabetic range, as only one value in diabetic range  Has had weight gain-slowly-over past 2 years Will discuss dietary changes, repeat in 3 months. BMP today. Consider starting metformin.

## 2019-04-19 LAB — LIPID PANEL
Chol/HDL Ratio: 6.9 ratio — ABNORMAL HIGH (ref 0.0–4.4)
Cholesterol, Total: 268 mg/dL — ABNORMAL HIGH (ref 100–199)
HDL: 39 mg/dL — ABNORMAL LOW (ref >39)
LDL Chol Calc (NIH): 190 mg/dL — ABNORMAL HIGH (ref 0–99)
Triglycerides: 206 mg/dL — ABNORMAL HIGH (ref 0–149)
VLDL Cholesterol Cal: 39 mg/dL (ref 5–40)

## 2019-04-19 LAB — BASIC METABOLIC PANEL
BUN/Creatinine Ratio: 18 (ref 9–23)
BUN: 13 mg/dL (ref 6–24)
CO2: 23 mmol/L (ref 20–29)
Calcium: 9.6 mg/dL (ref 8.7–10.2)
Chloride: 103 mmol/L (ref 96–106)
Creatinine, Ser: 0.74 mg/dL (ref 0.57–1.00)
GFR calc Af Amer: 105 mL/min/{1.73_m2} (ref >59)
GFR calc non Af Amer: 91 mL/min/{1.73_m2} (ref >59)
Glucose: 106 mg/dL — ABNORMAL HIGH (ref 65–99)
Potassium: 4.4 mmol/L (ref 3.5–5.2)
Sodium: 142 mmol/L (ref 134–144)

## 2019-04-19 LAB — CBC
Hematocrit: 41 % (ref 34.0–46.6)
Hemoglobin: 13.3 g/dL (ref 11.1–15.9)
MCH: 27.3 pg (ref 26.6–33.0)
MCHC: 32.4 g/dL (ref 31.5–35.7)
MCV: 84 fL (ref 79–97)
Platelets: 358 10*3/uL (ref 150–450)
RBC: 4.88 x10E6/uL (ref 3.77–5.28)
RDW: 13 % (ref 11.7–15.4)
WBC: 11.5 10*3/uL — ABNORMAL HIGH (ref 3.4–10.8)

## 2019-04-21 ENCOUNTER — Ambulatory Visit
Admission: RE | Admit: 2019-04-21 | Discharge: 2019-04-21 | Disposition: A | Discharge status: Home / Self Care | Payer: No Typology Code available for payment source | Source: Ambulatory Visit | Attending: Family Medicine | Admitting: Family Medicine

## 2019-04-21 ENCOUNTER — Telehealth: Payer: Self-pay | Admitting: Family Medicine

## 2019-04-21 ENCOUNTER — Encounter: Payer: Self-pay | Admitting: Family Medicine

## 2019-04-21 DIAGNOSIS — M25561 Pain in right knee: Secondary | ICD-10-CM

## 2019-04-21 NOTE — Telephone Encounter (Signed)
Called patient with results. ASCVD 8.6%. Discussed dietary changes vs. Statin. Recommend repeat in 3 months. Discussed specific dietary changes.  Knee is slowly improving. Recommended returning to work. Consider steroid injection in future.   Awaiting x-ray. All questions answered.   [name], MD  Family Medicine Teaching Service

## 2019-04-23 ENCOUNTER — Other Ambulatory Visit: Payer: Self-pay

## 2019-04-23 DIAGNOSIS — M25561 Pain in right knee: Secondary | ICD-10-CM

## 2019-04-23 MED ORDER — MELOXICAM 7.5 MG PO TABS: 7.5000 mg | ORAL_TABLET | Freq: Every day | ORAL | 0 refills | Status: DC

## 2019-04-23 NOTE — Telephone Encounter (Signed)
Patient Alyssa Rangel on nurse line requesting refill of meloxicam. Called patient as refill was sent in on 4/9 and rx sig states to take once per day. Patient states that she has been needing to take twice per day and that she has already discussed with PCP.   To PCP  Talbot Grumbling, RN

## 2019-05-04 ENCOUNTER — Encounter: Payer: Self-pay | Admitting: Family Medicine

## 2019-05-05 ENCOUNTER — Other Ambulatory Visit: Payer: Self-pay

## 2019-05-05 ENCOUNTER — Ambulatory Visit (INDEPENDENT_AMBULATORY_CARE_PROVIDER_SITE_OTHER): Payer: No Typology Code available for payment source | Admitting: Family Medicine

## 2019-05-05 ENCOUNTER — Encounter: Payer: Self-pay | Admitting: Family Medicine

## 2019-05-05 VITALS — BP 144/82 | HR 83 | Ht 65.0 in | Wt 268.2 lb

## 2019-05-05 DIAGNOSIS — R7303 Prediabetes: Secondary | ICD-10-CM

## 2019-05-05 DIAGNOSIS — E7800 Pure hypercholesterolemia, unspecified: Secondary | ICD-10-CM

## 2019-05-05 DIAGNOSIS — M25561 Pain in right knee: Secondary | ICD-10-CM

## 2019-05-05 DIAGNOSIS — Z1231 Encounter for screening mammogram for malignant neoplasm of breast: Secondary | ICD-10-CM | POA: Diagnosis not present

## 2019-05-05 DIAGNOSIS — Z1211 Encounter for screening for malignant neoplasm of colon: Secondary | ICD-10-CM | POA: Diagnosis not present

## 2019-05-05 DIAGNOSIS — R030 Elevated blood-pressure reading, without diagnosis of hypertension: Secondary | ICD-10-CM | POA: Insufficient documentation

## 2019-05-05 MED ORDER — MELOXICAM 7.5 MG PO TABS: 7.5000 mg | ORAL_TABLET | Freq: Two times a day (BID) | ORAL | 0 refills | Status: DC

## 2019-05-05 MED ORDER — BACLOFEN 10 MG PO TABS: 10.0000 mg | ORAL_TABLET | Freq: Three times a day (TID) | ORAL | 0 refills | Status: DC

## 2019-05-05 NOTE — Assessment & Plan Note (Signed)
Discussed.  The patient is determined to make lifestyle changes.  She reports low activity and some increased consumption of what she describes as "junk food" over the last few months secondary to the pandemic and stress at work.  Referred to nutrition.  I gave her Dr. Jenne Campus card.  Follow-up in 4 to 6 weeks to discuss further.

## 2019-05-05 NOTE — Assessment & Plan Note (Signed)
Last A1c was 7.0 which is in diabetic range.  However given ADA criteria for 2 measurements in diabetic range and her blood glucoses within a more normal range we will not start medication at this time.  We discussed options including starting Metformin today.  She would prefer to meet with nutritionist as well as work on her weight.  She reports she knows she is has been less than compliant over the past few months.  Covid has also been hard on her with her current job.   - Consider metformin at follow up.

## 2019-05-05 NOTE — Progress Notes (Signed)
SUBJECTIVE:   CHIEF COMPLAINT / HPI:   Alyssa Rangel is a pleasant 54 year old woman with history significant for prediabetes, obesity and elevated blood pressure presented today for routine follow-up.  At her last visit she had acute right leg and knee pain.  She was diagnosed ultimately with a ruptured Baker's cyst.  This is improving.  She still has limited range of motion of her right knee to some degree.  No falls, locking, catching.  We reviewed her imaging and x-rays.  She reports benefit from her NSAID and baclofen.  She is back to work fully.  We reviewed her recent labs.  She has a A1c that is in the diabetic range for the first time.  Her blood pressure is also mildly elevated today.  Reviewed her cholesterol and risk factors for cardiovascular disease. She would very much like to pursue dietary changes at this time.  She would consider medications in the future but think she can make some significant changes in her life.  She is a non-smoker.  The patient has never had a colonoscopy.  She denies bright red blood per rectum, melena or hematochezia.  She denies changes in her stools.  She is due for her mammogram.  She denies any concerns about her breast health.  PERTINENT  PMH / PSH/Family/Social History :  Reviewed and updated as appropriate she has a strong family history of diabetes.  OBJECTIVE:   BP (!) 144/82   Pulse 83   Ht 5\' 5"  (1.651 m)   Wt 268 lb 3.2 oz (121.7 kg)   SpO2 97%   BMI 44.63 kg/m   HEENT: Sclera anicteric. Dentition is moderate. Appears well hydrated. Neck: Supple Cardiac: Regular rate and rhythm. Normal S1/S2. No murmurs, rubs, or gallops appreciated. Lungs: Clear bilaterally to ascultation.  Abdomen: Normoactive bowel sounds. No tenderness to deep or light palpation. No rebound or guarding.  Extremities:  Slightly antalgic gait.  Swelling markedly improved from prior.  Range of motion of right knee improved from prior.  No erythema or  warmth.  She has minimal swelling as compared to the left now.  There is no crepitus with range of motion.  She has mild tenderness to palpation along the medial joint line of both knees. Skin: Warm, dry Psych: Pleasant and appropriate     ASSESSMENT/PLAN:   Prediabetes Last A1c was 7.0 which is in diabetic range.  However given ADA criteria for 2 measurements in diabetic range and her blood glucoses within a more normal range we will not start medication at this time.  We discussed options including starting Metformin today.  She would prefer to meet with nutritionist as well as work on her weight.  She reports she knows she is has been less than compliant over the past few months.  Covid has also been hard on her with her current job.   - Consider metformin at follow up.    Elevated blood pressure reading Discussed.  The patient is determined to make lifestyle changes.  She reports low activity and some increased consumption of what she describes as "junk food" over the last few months secondary to the pandemic and stress at work.  Referred to nutrition.  I gave her Dr. Jenne Campus card.  Follow-up in 4 to 6 weeks to discuss further.   Hypercholesterolemia The 10-year ASCVD risk score Mikey Bussing DC Jr., et al., 2013) is: 14.3%   Values used to calculate the score:     Age: 89 years  Sex: Female     Is Non-Hispanic African American: Yes     Diabetic: No     Tobacco smoker: No     Systolic Blood Pressure: XX123456 mmHg     Is BP treated: No     HDL Cholesterol: 39 mg/dL     Total Cholesterol: 268 mg/dL  Discussed on telephone as well. Repeat in July. If LDL not improved, start a statin.    HCM CSY ordered. Mammogram ordered. Card for Dr. Jenne Campus given---reminder to self to call in 1 week to check in.  [name], [place]

## 2019-05-05 NOTE — Assessment & Plan Note (Signed)
The 10-year ASCVD risk score Mikey Bussing DC Brooke Bonito., et al., 2013) is: 14.3%   Values used to calculate the score:     Age: 54 years     Sex: Female     Is Non-Hispanic African American: Yes     Diabetic: No     Tobacco smoker: No     Systolic Blood Pressure: [id number] mmHg     Is BP treated: No     HDL Cholesterol: 39 mg/dL     Total Cholesterol: 268 mg/dL  Discussed on telephone as well. Repeat in July. If LDL not improved, start a statin.

## 2019-05-05 NOTE — Patient Instructions (Addendum)
It was wonderful to see you today.  Please bring ALL of your medications with you to every visit.   Thank you for choosing [facility].   Please call [phone] with any questions about today's appointment.  Please be sure to schedule follow up at the front  desk before you leave today.   [name], MD  Family Medicine   Follow up in 6 weeks   I will place a referral to Dr. Jenne Campus

## 2019-05-27 ENCOUNTER — Other Ambulatory Visit: Payer: Self-pay

## 2019-05-27 DIAGNOSIS — M25561 Pain in right knee: Secondary | ICD-10-CM

## 2019-05-27 MED ORDER — BACLOFEN 10 MG PO TABS: 10.0000 mg | ORAL_TABLET | Freq: Three times a day (TID) | ORAL | 0 refills | Status: DC

## 2019-05-28 ENCOUNTER — Other Ambulatory Visit: Payer: Self-pay | Admitting: *Deleted

## 2019-05-28 DIAGNOSIS — M25561 Pain in right knee: Secondary | ICD-10-CM

## 2019-06-05 ENCOUNTER — Ambulatory Visit
Admission: RE | Admit: 2019-06-05 | Discharge: 2019-06-05 | Disposition: A | Discharge status: Home / Self Care | Payer: No Typology Code available for payment source | Source: Ambulatory Visit | Attending: Family Medicine | Admitting: Family Medicine

## 2019-06-05 ENCOUNTER — Encounter: Payer: Self-pay | Admitting: Family Medicine

## 2019-06-05 ENCOUNTER — Other Ambulatory Visit: Payer: Self-pay

## 2019-06-05 DIAGNOSIS — Z1231 Encounter for screening mammogram for malignant neoplasm of breast: Secondary | ICD-10-CM

## 2019-06-10 ENCOUNTER — Other Ambulatory Visit: Payer: Self-pay | Admitting: *Deleted

## 2019-06-10 DIAGNOSIS — M25561 Pain in right knee: Secondary | ICD-10-CM

## 2019-06-10 MED ORDER — MELOXICAM 7.5 MG PO TABS: 7.5000 mg | ORAL_TABLET | Freq: Every day | ORAL | 0 refills | Status: DC

## 2019-06-13 ENCOUNTER — Other Ambulatory Visit: Payer: Self-pay

## 2019-06-13 DIAGNOSIS — M25561 Pain in right knee: Secondary | ICD-10-CM

## 2019-06-13 MED ORDER — BACLOFEN 10 MG PO TABS: 10.0000 mg | ORAL_TABLET | Freq: Three times a day (TID) | ORAL | 0 refills | Status: DC

## 2019-06-13 MED ORDER — MELOXICAM 7.5 MG PO TABS: 7.5000 mg | ORAL_TABLET | Freq: Every day | ORAL | 0 refills | Status: DC

## 2019-06-13 NOTE — Telephone Encounter (Signed)
Patient calls nurse line to check status of refill. Informed that medication had already been sent in at [facility]. Patient reports that she no longer uses [facility], she now uses [facility]. Called and cancelled rx at [facility]. Resending rx to [facility] per Dr. Saul Fordyce order.   FYI to PCP  Talbot Grumbling, RN

## 2019-06-13 NOTE — Addendum Note (Signed)
Addended by: Lona Millard C on: 06/13/2019 11:30 AM   Modules accepted: Orders

## 2019-06-23 ENCOUNTER — Other Ambulatory Visit: Payer: Self-pay

## 2019-06-23 ENCOUNTER — Encounter: Payer: Self-pay | Admitting: Family Medicine

## 2019-06-23 ENCOUNTER — Ambulatory Visit (INDEPENDENT_AMBULATORY_CARE_PROVIDER_SITE_OTHER): Payer: No Typology Code available for payment source | Admitting: Family Medicine

## 2019-06-23 VITALS — BP 135/75 | HR 68 | Ht 65.0 in | Wt 266.0 lb

## 2019-06-23 DIAGNOSIS — M25551 Pain in right hip: Secondary | ICD-10-CM | POA: Diagnosis not present

## 2019-06-23 DIAGNOSIS — M25561 Pain in right knee: Secondary | ICD-10-CM | POA: Diagnosis not present

## 2019-06-23 DIAGNOSIS — Z1211 Encounter for screening for malignant neoplasm of colon: Secondary | ICD-10-CM

## 2019-06-23 DIAGNOSIS — M1611 Unilateral primary osteoarthritis, right hip: Secondary | ICD-10-CM

## 2019-06-23 DIAGNOSIS — Z1212 Encounter for screening for malignant neoplasm of rectum: Secondary | ICD-10-CM

## 2019-06-23 DIAGNOSIS — E7800 Pure hypercholesterolemia, unspecified: Secondary | ICD-10-CM

## 2019-06-23 DIAGNOSIS — R7303 Prediabetes: Secondary | ICD-10-CM | POA: Diagnosis not present

## 2019-06-23 MED ORDER — TRAMADOL HCL 50 MG PO TABS: 50.0000 mg | ORAL_TABLET | Freq: Three times a day (TID) | ORAL | 0 refills | Status: AC | PRN

## 2019-06-23 MED ORDER — METFORMIN HCL ER 500 MG PO TB24: 1000.0000 mg | ORAL_TABLET | Freq: Every day | ORAL | 3 refills | Status: DC

## 2019-06-23 MED ORDER — METFORMIN HCL ER 500 MG PO TB24: 500.0000 mg | ORAL_TABLET | Freq: Every day | ORAL | 3 refills | Status: DC

## 2019-06-23 MED ORDER — BACLOFEN 10 MG PO TABS: 10.0000 mg | ORAL_TABLET | Freq: Three times a day (TID) | ORAL | 0 refills | Status: DC | PRN

## 2019-06-23 NOTE — Patient Instructions (Addendum)
It was wonderful to see you today.  Please bring ALL of your medications with you to every visit.   Today we talked about:  1. Your leg pain---please go to [facility] imaging to get an x-ray at your earliest convenience, this is ordered 2. Diabetes---please call Dr. Jenne Campus to schedule 3. Colonoscopy--- I placed a referral, you should be called 4. A new medication for you sugar called metformin  Thank you for choosing [facility].   Please call [phone] with any questions about today's appointment.  Please be sure to schedule follow up at the front  desk before you leave today. Follow up with me in July---we will check an A1C (long term marker of diabetes) at that time   [name], MD  Family Medicine

## 2019-06-23 NOTE — Assessment & Plan Note (Signed)
Discussed at length, due for A1C in July.  Discussed dietary/exercise changes. A1C in diabetic range, only other values are non fasting BG. Start metformin daily, discussed side effects.

## 2019-06-23 NOTE — Progress Notes (Signed)
    SUBJECTIVE:   CHIEF COMPLAINT / HPI:   Alyssa Rangel is a 54 year old with history significant for prediabetes with likely progression to diabetes, morbid obesity and OA of the hip presenting today for routine follow-up.  The patient was first seen several months ago for right knee pain and swelling.  She was diagnosed with a Baker's cyst which has considerably improved.  Now she reports much of the pain is migrated to her hip area as well as upper leg.  Her swelling is improved.  No redness or erythema.  No further falls.  Notably she has a history of a hip replacement with Dr. Lorre Nick 6 years ago.  She denies numbness or tingling.  She denies back pain.  The pain is anterior located in the hip and radiates down her leg to her knee.  She has not tried physical therapy.  She has been taking meloxicam daily for 1 month.  She is using baclofen intermittently for muscle spasms.  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  The patient reports that she is pleased she lost 2 pounds.  She has been trying to work on her diet and walking more.  This is difficult with her knee pain.  Patient is amenable to colonoscopy.   PERTINENT  PMH / PSH/Family/Social History : Prediabetes---last A1C 7.0   OBJECTIVE:   BP 135/75   Pulse 68   Ht 5\' 5"  (1.651 m)   Wt 266 lb (120.7 kg)   SpO2 95%   BMI 44.26 kg/m   HEENT: Sclera anicteric. Dentition is moderate. Appears well hydrated. Neck: Supple Cardiac: Regular rate and rhythm. Normal S1/S2. No murmurs, rubs, or gallops appreciated. Lungs: Clear bilaterally to ascultation.  MSK Slightly antalgic gait.  Bilateral knee inspection is notable for right knee slightly larger than left.  There is no erythema or redness over it.  No tenderness to palpation along the medial or lateral joint line.  She does have pain along the posterior popliteal fossa.  Full range of motion of knee.  Preserved strength. Hip exam is notable for reduced  internal rotation.  Pain along the lateral aspect of hip over IT band stretching down to the knee.  ASSESSMENT/PLAN:   OA (osteoarthritis) of hip Query if underlying knee osteoarthritis and recent injury has triggered muscle spasm versus injury to the hip.  Will evaluate prosthesis with x-ray.  Given duration of symptoms recommended physical therapy.  If symptoms persist consider injection versus referral to Dr. Lorre Nick or sports medicine.  The patient has been taking meloxicam daily.  She finds no relief with this.  We discussed at length.  I recommended restarting Tylenol standing daily.  She is to discontinue meloxicam use baclofen sparingly.Discussed options at length---has had considerable benefit with tramadol in past. PMDP appropriate. Discussed---short course for 3 months, no refills #10 tablets only. Will reassess after PT. Discussed return precautions.   Hypercholesterolemia Discussed starting statin at next visit.  Discussed dietary changes. Number given for Dr. Jenne Campus.   Prediabetes Discussed at length, due for A1C in July.  Discussed dietary/exercise changes. A1C in diabetic range, only other values are non fasting BG. Start metformin daily, discussed side effects.   HCM Referred to GI for CSY UTD on mammogram   [name], MD  [place]

## 2019-06-23 NOTE — Assessment & Plan Note (Signed)
Discussed starting statin at next visit.  Discussed dietary changes. Number given for Dr. Jenne Campus.

## 2019-06-23 NOTE — Assessment & Plan Note (Signed)
Query if underlying knee osteoarthritis and recent injury has triggered muscle spasm versus injury to the hip.  Will evaluate prosthesis with x-ray.  Given duration of symptoms recommended physical therapy.  If symptoms persist consider injection versus referral to Dr. Lorre Nick or sports medicine.  The patient has been taking meloxicam daily.  She finds no relief with this.  We discussed at length.  I recommended restarting Tylenol standing daily.  She is to discontinue meloxicam use baclofen sparingly.Discussed options at length---has had considerable benefit with tramadol in past. PMDP appropriate. Discussed---short course for 3 months, no refills #10 tablets only. Will reassess after PT. Discussed return precautions.

## 2019-07-03 ENCOUNTER — Ambulatory Visit
Admission: RE | Admit: 2019-07-03 | Discharge: 2019-07-03 | Disposition: A | Discharge status: Home / Self Care | Payer: No Typology Code available for payment source | Source: Ambulatory Visit | Attending: Family Medicine | Admitting: Family Medicine

## 2019-07-03 ENCOUNTER — Telehealth: Payer: Self-pay | Admitting: Family Medicine

## 2019-07-03 DIAGNOSIS — M25561 Pain in right knee: Secondary | ICD-10-CM

## 2019-07-03 MED ORDER — BACLOFEN 10 MG PO TABS: 10.0000 mg | ORAL_TABLET | Freq: Three times a day (TID) | ORAL | 0 refills | Status: DC | PRN

## 2019-07-03 NOTE — Telephone Encounter (Signed)
Called with results of normal hip xray. Recommended calling PT to schedule. She requests referral back to [facility], referral placed. Also requests refill of baclofen, using TID, discussed reducing, refilled. Discussed side effects.   [name], MD  Family Medicine Teaching Service

## 2019-07-21 ENCOUNTER — Ambulatory Visit: Payer: No Typology Code available for payment source

## 2019-07-22 ENCOUNTER — Telehealth: Payer: Self-pay | Admitting: *Deleted

## 2019-07-22 DIAGNOSIS — G8929 Other chronic pain: Secondary | ICD-10-CM

## 2019-07-22 MED ORDER — NAPROXEN 500 MG PO TABS
500.0000 mg | ORAL_TABLET | Freq: Two times a day (BID) | ORAL | 0 refills | Status: DC
Start: 2019-07-22 — End: 2019-08-18

## 2019-07-22 NOTE — Telephone Encounter (Signed)
Called patient regarding concerns. Patient just returned from work doing okay.   Recommended calling insurance to see which PT is covered.   Discussed pain control options, Rx for naproxen. All questions answered.   Patient is to call back with list of covered PT providers--please ask her which ones are covered and then I will send referral to one of these.  [name], MD  Family Medicine Teaching Service

## 2019-07-22 NOTE — Telephone Encounter (Signed)
Referral sent to new ortho office per patient request.  Alyssa Rangel

## 2019-07-22 NOTE — Telephone Encounter (Signed)
Pt calls for several reasons:  1. She would like to go to [facility] on [address] instead of [facility] (closer to work).  Will forward to referral coordinator.  2. PT is not covered by her insurance and will cost her $300.  She has opted out of that at the moment.  3. She wants to let the MD know that the baclofen and tramadol do not help her pain.  It is getting progressively worse.  Christen Bame, CMA

## 2019-08-04 ENCOUNTER — Ambulatory Visit (INDEPENDENT_AMBULATORY_CARE_PROVIDER_SITE_OTHER): Payer: No Typology Code available for payment source | Admitting: Family Medicine

## 2019-08-04 ENCOUNTER — Other Ambulatory Visit: Payer: Self-pay

## 2019-08-04 ENCOUNTER — Encounter: Payer: Self-pay | Admitting: Family Medicine

## 2019-08-04 VITALS — BP 138/80 | HR 78 | Ht 64.0 in | Wt 268.6 lb

## 2019-08-04 DIAGNOSIS — E119 Type 2 diabetes mellitus without complications: Secondary | ICD-10-CM | POA: Diagnosis not present

## 2019-08-04 DIAGNOSIS — R7303 Prediabetes: Secondary | ICD-10-CM

## 2019-08-04 LAB — POCT GLYCOSYLATED HEMOGLOBIN (HGB A1C): HbA1c, POC (controlled diabetic range): 6.8 % (ref 0.0–7.0)

## 2019-08-04 MED ORDER — ROSUVASTATIN CALCIUM 5 MG PO TABS: 5.0000 mg | ORAL_TABLET | Freq: Every day | ORAL | 3 refills | Status: DC

## 2019-08-04 NOTE — Progress Notes (Signed)
    SUBJECTIVE:   CHIEF COMPLAINT / HPI:   Alyssa Rangel is a pleasant 54 year old woman with history significant for type 2 diabetes, diagnosed by 2 elevated A1c values at this time, and chronic right knee pain presenting today for follow-up.  During our last phone conversation the patient reported uncontrolled knee pain.  This is improved.  She reports she feels overall well.  She still continues to work with patients regularly.  She has received her 2 Covid vaccines.  Last week was her birthday and she is very pleased with this.  The patient's A1c today is 6.8 congratulated on improvement.  Discussed ways to continue to make small changes.  She is very interested in improving her health and ready more about this.  She continues to take metformin therapy she is not on a statin, ACE or ARB.  She does not smoke.  PERTINENT  PMH / PSH/Family/Social History :  Newly diagnosed type 2 DM  OBJECTIVE:   BP (!) 138/80   Pulse 78   Ht 5\' 4"  (1.626 m)   Wt (!) 268 lb 9.6 oz (121.8 kg)   SpO2 97%   BMI 46.11 kg/m    Cardiac: Regular rate and rhythm. Normal S1/S2. No murmurs, rubs, or gallops appreciated. Lungs: Clear bilaterally to ascultation.  Skin: Warm, dry Psych: Pleasant and appropriate  Monofilament in screening flowsheet  ASSESSMENT/PLAN:   Type 2 diabetes mellitus without complications ([place]), newly diagnosed, requiring monitoring, discussion as below. New Rx for statin (dyslipidemia). Discussed side effects, how to administer  Discussed long-term complications.  Patient is well educated and has a lot of knowledge about diabetes.  We discussed that she is just 2 values in the diabetic range.  Encouraged healthy eating habits and increasing exercise.  She will consider meeting with nutrition when she returns later this fall.  Continue Metformin at this time.  Repeat A1c in 3 months.  Prescribed statin today  At follow-up needs an eye exam as well as a urine microalbumin.   Consider losartan given her mildly elevated blood pressure in past (improved today)    Dyslipidemia, not at goal, requiring therapy and monitoring,  prior LDL 190, repeat at follow up, Rx for crestor today  HCM - Eye and urine albumin at follow up - PCV23 at follow up - Discuss CSY at follow up again   [name], [place]

## 2019-08-04 NOTE — Assessment & Plan Note (Signed)
Discussed long-term complications.  Patient is well educated and has a lot of knowledge about diabetes.  We discussed that she is just 2 values in the diabetic range.  Encouraged healthy eating habits and increasing exercise.  She will consider meeting with nutrition when she returns later this fall.  Continue Metformin at this time.  Repeat A1c in 3 months.  Prescribed statin today  At follow-up needs an eye exam as well as a urine microalbumin.  Consider losartan given her mildly elevated blood pressure in past (improved today)

## 2019-08-04 NOTE — Patient Instructions (Addendum)
It was wonderful to see you today.  Please bring ALL of your medications with you to every visit.   Today we talked about:  - Starting crestor for your diabetes and protecting your brain and heart - Take at night with lots of water - let me know if you have side effects - We will repeat your A1C at your next visit  Good luck at Orthopedics!!!   - Think about walking 3 times per week for 10 minutes   Follow up in 3 months   Thank you for choosing [facility].   Please call [phone] with any questions about today's appointment.  Please be sure to schedule follow up at the front  desk before you leave today.   [name], MD  Family Medicine

## 2019-08-08 ENCOUNTER — Ambulatory Visit (INDEPENDENT_AMBULATORY_CARE_PROVIDER_SITE_OTHER): Payer: No Typology Code available for payment source | Admitting: Orthopedic Surgery

## 2019-08-08 VITALS — Ht 65.0 in | Wt 263.0 lb

## 2019-08-08 DIAGNOSIS — M1711 Unilateral primary osteoarthritis, right knee: Secondary | ICD-10-CM

## 2019-08-10 ENCOUNTER — Encounter: Payer: Self-pay | Admitting: Orthopedic Surgery

## 2019-08-10 DIAGNOSIS — M1711 Unilateral primary osteoarthritis, right knee: Secondary | ICD-10-CM | POA: Diagnosis not present

## 2019-08-10 MED ORDER — LIDOCAINE HCL 1 % IJ SOLN
5.0000 mL | INTRAMUSCULAR | Status: AC | PRN
  Administered 2019-08-10: 5 mL

## 2019-08-10 MED ORDER — METHYLPREDNISOLONE ACETATE 40 MG/ML IJ SUSP
40.0000 mg | INTRAMUSCULAR | Status: AC | PRN
Start: 2019-08-10 — End: 2019-08-10
  Administered 2019-08-10: 40 mg via INTRA_ARTICULAR

## 2019-08-10 MED ORDER — BUPIVACAINE HCL 0.25 % IJ SOLN
4.0000 mL | INTRAMUSCULAR | Status: AC | PRN
  Administered 2019-08-10: 4 mL via INTRA_ARTICULAR

## 2019-08-10 NOTE — Progress Notes (Addendum)
Office Visit Note   Patient: Alyssa Rangel           Date of Birth: 1965/02/13           MRN: [id number] Visit Date: 08/08/2019 Requested by: Martyn Malay, MD [place],  [place] [address] PCP: Martyn Malay, MD  Subjective: Chief Complaint  Patient presents with  . Right Knee - Pain    HPI: Alyssa Rangel is a 54 y.o. female who presents to the office complaining of right knee pain.  Patient reports worsening right knee pain over the last several months.  She denies any injury.  She describes insidious onset.  She localizes the pain to the medial aspect of the right knee.  She denies any groin pain.  Pain is worse with ambulation.  She has a history of a right total hip arthroplasty in 2014.  She denies any numbness/tingling/radicular pain.  She does note that the right knee gives way on her at times and occasionally locks every other week for an hour or so.  Pain wakes her up at night.  She works as a Quarry manager at a nursing home.  She denies any previous knee surgery, previous issue with her knee, previous injection in her knee..                ROS: All systems reviewed are negative as they relate to the chief complaint within the history of present illness.  Patient denies fevers or chills.  Assessment & Plan: Visit Diagnoses:  1. Unilateral primary osteoarthritis, right knee     Plan: Patient is a 54 year old female presents complaint of right knee pain.  She has had pain for several months without injury.  On exam she has tenderness over the medial joint line.  Radiographs of the right knee reviewed.  No fractures or dislocations noted but she does have some degenerative changes throughout all 3 compartments of the right knee.  Discussed options available to patient.  After discussion she wishes to proceed with right knee cortisone injection.  Right knee injection was well-tolerated by the patient.  Plan to preapproved patient for gel injection with 6-week  return for injections.  Patient agreed this plan.  This patient is diagnosed with osteoarthritis of the knee(s).    Radiographs show evidence of joint space narrowing, osteophytes, subchondral sclerosis and/or subchondral cysts.  This patient has knee pain which interferes with functional and activities of daily living.    This patient has experienced inadequate response, adverse effects and/or intolerance with conservative treatments such as acetaminophen, NSAIDS, topical creams, physical therapy or regular exercise, knee bracing and/or weight loss.   This patient has experienced inadequate response or has a contraindication to intra articular steroid injections for at least 3 months.   This patient is not scheduled to have a total knee replacement within 6 months of starting treatment with viscosupplementation.   Follow-Up Instructions: No follow-ups on file.   Orders:  No orders of the defined types were placed in this encounter.  No orders of the defined types were placed in this encounter.     Procedures: Large Joint Inj: R knee on 08/10/2019 1:36 PM Indications: diagnostic evaluation, joint swelling and pain Details: 18 G 1.5 in needle, superolateral approach  Arthrogram: No  Medications: 5 mL lidocaine 1 %; 40 mg methylPREDNISolone acetate 40 MG/ML; 4 mL bupivacaine 0.25 % Aspirate: blood-tinged Outcome: tolerated well, no immediate complications Procedure, treatment alternatives, risks and benefits explained, specific  risks discussed. Consent was given by the patient. Immediately prior to procedure a time out was called to verify the correct patient, procedure, equipment, support staff and site/side marked as required. Patient was prepped and draped in the usual sterile fashion.       Clinical Data: No additional findings.  Objective: Vital Signs: Ht 5\' 5"  (1.651 m)   Wt (!) 263 lb (119.3 kg)   BMI 43.77 kg/m   Physical Exam:  Constitutional: Patient appears  well-developed HEENT:  Head: Normocephalic Eyes:EOM are normal Neck: Normal range of motion Cardiovascular: Normal rate Pulmonary/chest: Effort normal Neurologic: Patient is alert Skin: Skin is warm Psychiatric: Patient has normal mood and affect  Ortho Exam: Orthopedic exam demonstrates right knee with extensor mechanism intact.  Small effusion present.  No pain with side to side motion of the patella.  No tenderness over the patella.  No tenderness over the quadriceps or patellar tendon.  Tender to palpation over the medial joint line.  No tenderness over the lateral joint line.  0 degrees extension.  Greater than 90 degrees flexion.  No pain with internal rotation/external rotation of the hip joint.  No significant laxity with anterior/posterior drawer.  Specialty Comments:  No specialty comments available.  Imaging: No results found.   PMFS History: Patient Active Problem List   Diagnosis Date Noted  . Type 2 diabetes mellitus without complications ([place]) 93/71/6967  . Obesity   . OA (osteoarthritis) of hip 10/09/2012  . 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 Hypercholesterolemia   . Rheumatoid arthritis(714.0)    Past Medical History:  Diagnosis Date  . Hearing loss   . Hypercholesterolemia    prior medication therapy  . Hypertension    due to diet pills in the past, never on meds; normotensive as of 7/14  . Prediabetes   . Rheumatoid arthritis(714.0)    patient reported history  . 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 Diabetes Mother        insulin dependent  . Hypertension Mother   . Other Father        unknown  . Multiple sclerosis Sister   . Heart disease Maternal Grandmother   . Diabetes Maternal Grandmother   . Hypertension Maternal Grandmother   . Arthritis Maternal Grandmother   . Cancer Neg Hx   . Stroke Neg Hx       Past Surgical History:  Procedure Laterality Date  . BILATERAL SALPINGECTOMY  02/06/2012   Procedure: BILATERAL SALPINGECTOMY;  Surgeon: Delice Lesch, MD;  Location: [facility];  Service: Gynecology;  Laterality: Bilateral;  partial bilateral salpingectomy  . CHOLECYSTECTOMY N/A 10/31/2012   Procedure: LAPAROSCOPIC CHOLECYSTECTOMY WITH INTRAOPERATIVE CHOLANGIOGRAM;  Surgeon: Edward Jolly, MD;  Location: [facility];  Service: General;  Laterality: N/A;  . CYSTOSCOPY  02/06/2012   Procedure: CYSTOSCOPY;  Surgeon: Delice Lesch, MD;  Location: [facility];  Service: Gynecology;  Laterality: N/A;  . DILITATION & CURRETTAGE/HYSTROSCOPY WITH NOVASURE ABLATION  02/06/2012   Procedure: DILATATION & CURETTAGE/HYSTEROSCOPY WITH NOVASURE ABLATION;  Surgeon: Delice Lesch, MD;  Location: [facility];  Service: Gynecology;  Laterality: N/A;   novasure  . LAPAROSCOPY  02/06/2012   Procedure: LAPAROSCOPY OPERATIVE;  Surgeon: Delice Lesch, MD;  Location: [facility];  Service: Gynecology;  Laterality: N/A;  . OVARIAN CYST REMOVAL  02/06/2012   Procedure: OVARIAN CYSTECTOMY;  Surgeon: Delice Lesch, MD;  Location: [facility];  Service: Gynecology;  Laterality: Bilateral;  . TOTAL HIP ARTHROPLASTY Right 10/09/2012   Procedure: RIGHT TOTAL HIP ARTHROPLASTY;  Surgeon: Gearlean Alf, MD;  Location: [facility];  Service: Orthopedics;  Laterality: Right;   Social History   Occupational History  . Not on file  Tobacco Use  . Smoking status: Former Smoker    Packs/day: 1.00    Years: 25.00    Pack years: 25.00    Types: Cigarettes    Quit date: 10/02/2011    Years since quitting: 7.8  . Smokeless tobacco: Never Used  Vaping Use  . Vaping Use: Never used  Substance and Sexual Activity  . Alcohol use: Yes    Alcohol/week: 1.0 standard drink    Types: 1 Glasses of wine per week    Comment: occ.  . Drug use: No  . Sexual activity: Not Currently    Birth control/protection: None

## 2019-08-11 ENCOUNTER — Telehealth: Payer: Self-pay

## 2019-08-11 NOTE — Telephone Encounter (Signed)
Pt is work Tax adviser. Ok to proceed with Gel injection scheduling?

## 2019-08-11 NOTE — Telephone Encounter (Signed)
Can we please get patient approved for right knee gel injection? °

## 2019-08-18 ENCOUNTER — Other Ambulatory Visit: Payer: Self-pay | Admitting: *Deleted

## 2019-08-18 MED ORDER — NAPROXEN 500 MG PO TABS: 500.0000 mg | ORAL_TABLET | Freq: Two times a day (BID) | ORAL | 0 refills | Status: DC

## 2019-09-19 ENCOUNTER — Ambulatory Visit: Payer: Self-pay

## 2019-09-19 ENCOUNTER — Telehealth: Payer: Self-pay

## 2019-09-19 ENCOUNTER — Ambulatory Visit (INDEPENDENT_AMBULATORY_CARE_PROVIDER_SITE_OTHER): Payer: No Typology Code available for payment source | Admitting: Surgical

## 2019-09-19 DIAGNOSIS — M1711 Unilateral primary osteoarthritis, right knee: Secondary | ICD-10-CM | POA: Diagnosis not present

## 2019-09-19 MED ORDER — CELECOXIB 100 MG PO CAPS
100.0000 mg | ORAL_CAPSULE | Freq: Two times a day (BID) | ORAL | 0 refills | Status: DC
Start: 2019-09-19 — End: 2019-10-17

## 2019-09-19 MED ORDER — ACETAMINOPHEN-CODEINE #3 300-30 MG PO TABS: 1.0000 | ORAL_TABLET | Freq: Two times a day (BID) | ORAL | 0 refills | Status: DC | PRN

## 2019-09-19 NOTE — Telephone Encounter (Signed)
Talked with Danna at [facility] insurance and was advised that patient has limited medical coverage, so gel injections would not be covered at all.    Patient is aware and understands.

## 2019-10-06 ENCOUNTER — Encounter: Payer: Self-pay | Admitting: Surgical

## 2019-10-06 NOTE — Progress Notes (Signed)
Office Visit Note   Patient: Alyssa Rangel           Date of Birth: 02-13-1965           MRN: [id number] Visit Date: 09/19/2019 Requested by: Martyn Malay, MD [place],  [place] [id number] PCP: Martyn Malay, MD  Subjective: Chief Complaint  Patient presents with  . Follow-up    knee pain    HPI: AZIYA ARENA is a 54 y.o. female who presents to the office complaining of right knee pain.  She had right knee aspiration and injection with cortisone on 08/08/2019.  She notes 20% improvement from the injection.  She is not taking any medications for pain.  She notes continued right knee pain that seems to be worsening.  She denies any instability or mechanical symptoms.  Pain does wake her up at night.  She notes swelling that comes and goes.  Denies any groin pain or radicular pain down her leg.  Most of her pain is in the medial aspect of the right knee.  She works as a Quarry manager.  Denies any previous knee surgery..                ROS: All systems reviewed are negative as they relate to the chief complaint within the history of present illness.  Patient denies fevers or chills.  Assessment & Plan: Visit Diagnoses:  1. Unilateral primary osteoarthritis, right knee     Plan: Patient is a 54 year old female who presents complaining of right knee pain.  Primarily located in the medial joint line.  She has had 1 injection that provided about 20% relief.  Her previous radiographs were reviewed and patient states that she was nonweightbearing for these radiographs.  Since they were several months old and not weightbearing, plan to get new radiographs today as the previous x-rays were not very impressive for arthritis.  New radiographs reveal moderate defect of the medial femoral condyle with progression compared with prior radiographs.  There is a very small, almost imperceptible deformity on her old radiographs but that has clearly progressed in the last several months.   Plan to order MRI of the right knee for further evaluation of this defect.  Follow-up after MRI to review results.  Patient agreed with this plan.  Follow-Up Instructions: No follow-ups on file.   Orders:  Orders Placed This Encounter  Procedures  . XR Knee 1-2 Views Right  . MR Knee Right w/o contrast   Meds ordered this encounter  Medications  . celecoxib (CELEBREX) 100 MG capsule    Sig: Take 1 capsule (100 mg total) by mouth 2 (two) times daily.    Dispense:  60 capsule    Refill:  0  . acetaminophen-codeine (TYLENOL #3) 300-30 MG tablet    Sig: Take 1 tablet by mouth every 12 (twelve) hours as needed for moderate pain.    Dispense:  30 tablet    Refill:  0      Procedures: No procedures performed   Clinical Data: No additional findings.  Objective: Vital Signs: There were no vitals taken for this visit.  Physical Exam:  Constitutional: Patient appears well-developed HEENT:  Head: Normocephalic Eyes:EOM are normal Neck: Normal range of motion Cardiovascular: Normal rate Pulmonary/chest: Effort normal Neurologic: Patient is alert Skin: Skin is warm Psychiatric: Patient has normal mood and affect  Ortho Exam: Ortho exam demonstrates right knee with severe tenderness over the medial joint line.  Trace effusion  is present today.  She does have some mild tenderness over the lateral joint line as well.  No tenderness over the patella.  Able to perform straight leg raise, extensor mechanism intact.  No ligamentous laxity with varus/valgus stress or on Lachman exam.  No PCL laxity.  0 degrees of extension to greater than 120 degrees of flexion.  No pain with hip range of motion.  No groin pain on exam.  Negative straight leg raise.  Specialty Comments:  No specialty comments available.  Imaging: No results found.   PMFS History: Patient Active Problem List   Diagnosis Date Noted  . Type 2 diabetes mellitus without complications ([place]) 47/42/5956  . Obesity   . OA  (osteoarthritis) of hip 10/09/2012  . 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 Hypercholesterolemia   . Rheumatoid arthritis(714.0)    Past Medical History:  Diagnosis Date  . Hearing loss   . Hypercholesterolemia    prior medication therapy  . Hypertension    due to diet pills in the past, never on meds; normotensive as of 7/14  . Prediabetes   . Rheumatoid arthritis(714.0)    patient reported history  . 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 Diabetes Mother        insulin dependent  . Hypertension Mother   . Other Father        unknown  . Multiple sclerosis Sister   . Heart disease Maternal Grandmother   . Diabetes Maternal Grandmother   . Hypertension Maternal Grandmother   . Arthritis Maternal Grandmother   . Cancer Neg Hx   . Stroke Neg Hx     Past Surgical History:  Procedure Laterality Date  . BILATERAL SALPINGECTOMY  02/06/2012   Procedure: BILATERAL SALPINGECTOMY;  Surgeon: Delice Lesch, MD;  Location: [facility];  Service: Gynecology;  Laterality: Bilateral;  partial bilateral salpingectomy  . CHOLECYSTECTOMY N/A 10/31/2012   Procedure: LAPAROSCOPIC CHOLECYSTECTOMY WITH INTRAOPERATIVE CHOLANGIOGRAM;  Surgeon: Edward Jolly, MD;  Location: [facility];  Service: General;  Laterality: N/A;  . CYSTOSCOPY  02/06/2012   Procedure: CYSTOSCOPY;  Surgeon: Delice Lesch, MD;  Location: [facility];  Service: Gynecology;  Laterality: N/A;  . DILITATION & CURRETTAGE/HYSTROSCOPY WITH NOVASURE ABLATION  02/06/2012   Procedure: DILATATION & CURETTAGE/HYSTEROSCOPY WITH NOVASURE ABLATION;  Surgeon: Delice Lesch, MD;  Location: [facility];  Service: Gynecology;  Laterality: N/A;   novasure  . LAPAROSCOPY  02/06/2012   Procedure: LAPAROSCOPY OPERATIVE;  Surgeon: Delice Lesch, MD;  Location: [facility];  Service: Gynecology;  Laterality: N/A;  . OVARIAN  CYST REMOVAL  02/06/2012   Procedure: OVARIAN CYSTECTOMY;  Surgeon: Delice Lesch, MD;  Location: [facility];  Service: Gynecology;  Laterality: Bilateral;  . TOTAL HIP ARTHROPLASTY Right 10/09/2012   Procedure: RIGHT TOTAL HIP ARTHROPLASTY;  Surgeon: Gearlean Alf, MD;  Location: [facility];  Service: Orthopedics;  Laterality: Right;   Social History   Occupational History  . Not on file  Tobacco Use  . Smoking status: Former Smoker    Packs/day: 1.00    Years: 25.00    Pack years: 25.00    Types: Cigarettes    Quit date: 10/02/2011    Years since quitting: 8.0  . Smokeless tobacco: Never Used  Vaping Use  . Vaping Use: Never used  Substance  and Sexual Activity  . Alcohol use: Yes    Alcohol/week: 1.0 standard drink    Types: 1 Glasses of wine per week    Comment: occ.  . Drug use: No  . Sexual activity: Not Currently    Birth control/protection: None

## 2019-10-11 ENCOUNTER — Other Ambulatory Visit: Payer: No Typology Code available for payment source

## 2019-10-14 ENCOUNTER — Other Ambulatory Visit: Payer: Self-pay | Admitting: Surgical

## 2019-10-16 ENCOUNTER — Other Ambulatory Visit: Payer: Self-pay | Admitting: Surgical

## 2019-10-18 ENCOUNTER — Ambulatory Visit
Admission: RE | Admit: 2019-10-18 | Discharge: 2019-10-18 | Disposition: A | Discharge status: Home / Self Care | Payer: No Typology Code available for payment source | Source: Ambulatory Visit | Attending: Surgical | Admitting: Surgical

## 2019-10-18 DIAGNOSIS — M1711 Unilateral primary osteoarthritis, right knee: Secondary | ICD-10-CM

## 2019-10-29 ENCOUNTER — Ambulatory Visit (INDEPENDENT_AMBULATORY_CARE_PROVIDER_SITE_OTHER): Payer: No Typology Code available for payment source | Admitting: Orthopedic Surgery

## 2019-10-29 ENCOUNTER — Encounter: Payer: Self-pay | Admitting: Orthopedic Surgery

## 2019-10-29 ENCOUNTER — Telehealth: Payer: Self-pay

## 2019-10-29 ENCOUNTER — Other Ambulatory Visit: Payer: Self-pay | Admitting: Surgical

## 2019-10-29 DIAGNOSIS — M1711 Unilateral primary osteoarthritis, right knee: Secondary | ICD-10-CM

## 2019-10-29 MED ORDER — METHYLPREDNISOLONE ACETATE 40 MG/ML IJ SUSP
40.0000 mg | INTRAMUSCULAR | Status: AC | PRN
  Administered 2019-10-29: 40 mg via INTRA_ARTICULAR

## 2019-10-29 MED ORDER — ACETAMINOPHEN-CODEINE #3 300-30 MG PO TABS
1.0000 | ORAL_TABLET | Freq: Two times a day (BID) | ORAL | 0 refills | Status: DC | PRN
Start: 2019-10-29 — End: 2020-08-05

## 2019-10-29 MED ORDER — CYCLOBENZAPRINE HCL 10 MG PO TABS: ORAL_TABLET | ORAL | 0 refills | Status: DC

## 2019-10-29 MED ORDER — BUPIVACAINE HCL 0.25 % IJ SOLN
4.0000 mL | INTRAMUSCULAR | Status: AC | PRN
  Administered 2019-10-29: 4 mL via INTRA_ARTICULAR

## 2019-10-29 MED ORDER — LIDOCAINE HCL 1 % IJ SOLN
5.0000 mL | INTRAMUSCULAR | Status: AC | PRN
  Administered 2019-10-29: 5 mL

## 2019-10-29 NOTE — Telephone Encounter (Signed)
Dr Marlou Sa saw patient in clinic this afternoon and would like for patient to get rx for T#3 1 po q 12 #30 with no rf. Can you please submit?

## 2019-10-29 NOTE — Telephone Encounter (Signed)
Sent in rx.

## 2019-10-29 NOTE — Progress Notes (Signed)
Office Visit Note   Patient: Alyssa Rangel           Date of Birth: October 31, 1965           MRN: [id number] Visit Date: 10/29/2019 Requested by: Martyn Malay, MD [place],  [place] [address] PCP: Martyn Malay, MD  Subjective: Chief Complaint  Patient presents with  . 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  Since have seen her she had an MRI scan which is reviewed with her today.  That scan shows on the medial side radial tear with extrusion of the meniscus along with medial compartment arthritis and a new osteochondral crater measuring about 1.8 x 8 mm.  She has been on tramadol which has not helped.  Celebrex also not helpful.  Reports generally global pain.  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  She has had 1 injection over 6 weeks ago.  Has a history of right total knee replacement.              ROS: All systems reviewed are negative as they relate to the chief complaint within the history of present illness.  Patient denies  fevers or chills.   Assessment & Plan: Visit Diagnoses:  1. Unilateral primary osteoarthritis, right knee     Plan: Impression is right knee arthritis with meniscal extrusion and new osteochondral defect on the medial femoral condyle.  She also has some lateral femoral condyle early arthritis.  Plan is that I had a long talk with Alyssa Rangel today.  In general there is nothing really arthroscopically predictably beneficial for this problem.  I think she is looking at a knee replacement at a time when her symptoms cannot be managed anymore.  We did do an aspiration and injection today.  Tylenol 3 refill x1 only with Flexeril also added.  Gave her Debbie's card so she can call to schedule when she wants.  Discussed the risk and benefits of knee replacement at her young age including but not limited to infection nerve vessel damage knee stiffness potential need for revision in her  lifetime as well as incomplete pain relief and incomplete restoration of function.  Patient understands and will consider her options.  Follow-up as needed  Follow-Up Instructions: Return if symptoms worsen or fail to improve.   Orders:  No orders of the defined types were placed in this encounter.  Meds ordered this encounter  Medications  . cyclobenzaprine (FLEXERIL) 10 MG tablet    Sig: 1 po q hs prn    Dispense:  30 tablet    Refill:  0      Procedures: Large Joint Inj: R knee on 10/29/2019 7:05 PM Indications: diagnostic evaluation, joint swelling and pain Details: 18 G 1.5 in needle, superolateral approach  Arthrogram: No  Medications: 5 mL lidocaine 1 %; 40 mg methylPREDNISolone acetate 40 MG/ML; 4 mL bupivacaine 0.25 % Outcome: tolerated well, no immediate complications Procedure, treatment alternatives, risks and benefits explained, specific risks discussed. Consent was given by the patient. Immediately prior to procedure a time out was called to verify the correct patient, procedure, equipment, support staff and site/side marked as required. Patient was prepped and draped in the usual sterile fashion.       Clinical Data: No additional findings.  Objective: Vital Signs: There were no vitals taken for this visit.  Physical Exam:   Constitutional: Patient appears well-developed HEENT:  Head: Normocephalic Eyes:EOM are normal Neck: Normal range of motion Cardiovascular: Normal rate Pulmonary/chest: Effort normal Neurologic: Patient is alert Skin: Skin is warm Psychiatric: Patient has normal mood and affect    Ortho Exam: Ortho exam demonstrates pretty normal gait alignment.  She does have a mild effusion today in the right knee.  Global knee pain more on the medial than lateral side.  Collateral crucial ligaments are stable.  Patient has full extension and flexes to about 120.  Medial greater than lateral joint line tenderness.  No masses lymphadenopathy or  skin changes noted in that right knee region.  Pedal pulses palpable with ankle dorsiflexion intact.  Specialty Comments:  No specialty comments available.  Imaging: No results found.   PMFS History: Patient Active Problem List   Diagnosis Date Noted  . Type 2 diabetes mellitus without complications ([place]) 73/53/2992  . Obesity   . OA (osteoarthritis) of hip 10/09/2012  . 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 Hypercholesterolemia   . Rheumatoid arthritis(714.0)    Past Medical History:  Diagnosis Date  . Hearing loss   . Hypercholesterolemia    prior medication therapy  . Hypertension    due to diet pills in the past, never on meds; normotensive as of 7/14  . Prediabetes   . Rheumatoid arthritis(714.0)    patient reported history  . 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 Diabetes Mother        insulin dependent  . Hypertension Mother   . Other Father        unknown  . Multiple sclerosis Sister   . Heart disease Maternal Grandmother   . Diabetes Maternal Grandmother   . Hypertension Maternal Grandmother   . Arthritis Maternal Grandmother   . Cancer Neg Hx   . Stroke Neg Hx     Past Surgical History:  Procedure Laterality Date  . BILATERAL SALPINGECTOMY  02/06/2012   Procedure: BILATERAL SALPINGECTOMY;  Surgeon: Delice Lesch, MD;  Location: [facility];  Service: Gynecology;  Laterality: Bilateral;  partial bilateral salpingectomy  . CHOLECYSTECTOMY N/A 10/31/2012   Procedure: LAPAROSCOPIC CHOLECYSTECTOMY WITH INTRAOPERATIVE CHOLANGIOGRAM;  Surgeon: Edward Jolly, MD;  Location: [facility];  Service: General;  Laterality: N/A;  . CYSTOSCOPY  02/06/2012   Procedure: CYSTOSCOPY;  Surgeon: Delice Lesch, MD;  Location: [facility];  Service: Gynecology;  Laterality: N/A;  . DILITATION & CURRETTAGE/HYSTROSCOPY WITH NOVASURE ABLATION   02/06/2012   Procedure: DILATATION & CURETTAGE/HYSTEROSCOPY WITH NOVASURE ABLATION;  Surgeon: Delice Lesch, MD;  Location: [facility];  Service: Gynecology;  Laterality: N/A;   novasure  . LAPAROSCOPY  02/06/2012   Procedure: LAPAROSCOPY OPERATIVE;  Surgeon: Delice Lesch, MD;  Location: [facility];  Service: Gynecology;  Laterality: N/A;  . OVARIAN CYST REMOVAL  02/06/2012   Procedure: OVARIAN CYSTECTOMY;  Surgeon: Delice Lesch, MD;  Location: [facility];  Service: Gynecology;  Laterality: Bilateral;  . TOTAL HIP ARTHROPLASTY Right 10/09/2012   Procedure: RIGHT TOTAL HIP ARTHROPLASTY;  Surgeon: Gearlean Alf, MD;  Location: [facility];  Service: Orthopedics;  Laterality: Right;   Social History   Occupational History  . Not on file  Tobacco Use  . Smoking status: Former Smoker    Packs/day: 1.00    Years: 25.00    Pack years: 25.00  Types: Cigarettes    Quit date: 10/02/2011    Years since quitting: 8.0  . Smokeless tobacco: Never Used  Vaping Use  . Vaping Use: Never used  Substance and Sexual Activity  . Alcohol use: Yes    Alcohol/week: 1.0 standard drink    Types: 1 Glasses of wine per week    Comment: occ.  . Drug use: No  . Sexual activity: Not Currently    Birth control/protection: None

## 2019-11-10 ENCOUNTER — Encounter: Payer: Self-pay | Admitting: Family Medicine

## 2019-11-10 ENCOUNTER — Ambulatory Visit (INDEPENDENT_AMBULATORY_CARE_PROVIDER_SITE_OTHER): Payer: No Typology Code available for payment source | Admitting: Family Medicine

## 2019-11-10 ENCOUNTER — Other Ambulatory Visit: Payer: Self-pay

## 2019-11-10 VITALS — BP 138/88 | HR 86 | Wt 263.1 lb

## 2019-11-10 DIAGNOSIS — Z23 Encounter for immunization: Secondary | ICD-10-CM

## 2019-11-10 DIAGNOSIS — E119 Type 2 diabetes mellitus without complications: Secondary | ICD-10-CM | POA: Diagnosis not present

## 2019-11-10 LAB — POCT GLYCOSYLATED HEMOGLOBIN (HGB A1C): Hemoglobin A1C: 6.6 % — AB (ref 4.0–5.6)

## 2019-11-10 NOTE — Progress Notes (Signed)
    SUBJECTIVE:   CHIEF COMPLAINT / HPI:   Alyssa Rangel is a very pleasant 54 year old woman with history significant for right knee osteoarthritis and type 2 diabetes presenting today for routine follow-up.  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  She is on her feet most of the day.  She is followed with orthopedics for her knee pain and was instructed that she could undergo injections or likely needs to return for knee replacement.  Her osteoarthritis is quite severe and she has a concomitant meniscal tear.  She is using Celebrex every other day, Flexeril once or twice a week at night and Tylenol 3 a few times a week.  She denies side effects or symptoms of adverse events from any of these medications.  Patient has been very dedicated to watching her diet and increasing her exercise despite her knee pain.  She is taking her Metformin twice a day and her statin at night.  She reports compliance with these medications.  She denies polyuria or polydipsia. The patient is already received her Covid vaccine.   PERTINENT  PMH / PSH/Family/Social History : Updated and reviewed as appropriate.  OBJECTIVE:   BP 138/88   Pulse 86   Wt 263 lb 1.6 oz (119.3 kg)   SpO2 96%   BMI 43.78 kg/m   Today's weight:  Last Weight  Most recent update: 11/10/2019 11:22 AM   Weight  119.3 kg (263 lb 1.6 oz)           Review of prior weights: Filed Weights   11/10/19 1121  Weight: 263 lb 1.6 oz (119.3 kg)   HEENT: Sclera anicteric. Dentition is poor. Appears well hydrated. Neck: Supple Cardiac: Regular rate and rhythm. Normal S1/S2. No murmurs, rubs, or gallops appreciated. Lungs: Clear bilaterally to ascultation.  Extremities: Warm, well perfused without edema.  Skin: Warm, dry    ASSESSMENT/PLAN:   Type 2 diabetes mellitus without complications ([place]) Congratulated on A1c.  Continue statin.  A1c was at goal today.  Referred to ophthalmology.   Chronic knee pain  due to osteoarthritis discussed safe use of medications at length.  Discussed that I do not prescribe narcotics for chronic pain syndrome she will return to orthopedics if she needs Tylenol 3 refilled.  Discussed side effects of Flexeril as well.  She is doing well with these medications and really is tolerating these quite well and functioning at work well.  Healthcare maintenance flu shot given today, she is previously referred for colonoscopy but does not look like this was done.  Will discuss this at follow-up with patient.  [name], [place]

## 2019-11-10 NOTE — Patient Instructions (Addendum)
It was wonderful to see you today.  Please bring ALL of your medications with you to every visit.   Today we talked about:  --- CONGRATULATIONS ON YOUR WEIGHT   -- Keep up the great work on Diabetes!!!    -- I put in a referral to Eye Doctor  Monitor your blood pressure 1-2 times per week--Please please message me with the numbers   Thank you for choosing [facility].   Please call [phone] with any questions about today's appointment.  Please be sure to schedule follow up at the front  desk before you leave today. Follow up in 3 months   [name], MD  Family Medicine

## 2019-11-10 NOTE — Assessment & Plan Note (Signed)
Congratulated on A1c.  Continue statin.  A1c was at goal today.  Referred to ophthalmology.

## 2019-11-11 LAB — MICROALBUMIN / CREATININE URINE RATIO
Creatinine, Urine: 119.5 mg/dL
Microalb/Creat Ratio: 204 mg/g creat — ABNORMAL HIGH (ref 0–29)
Microalbumin, Urine: 243.8 ug/mL

## 2019-11-12 ENCOUNTER — Telehealth: Payer: Self-pay | Admitting: Family Medicine

## 2019-11-12 NOTE — Telephone Encounter (Signed)
Called about microalbuminuria. Repeat at follow up.   [name], MD  Family Medicine Teaching Service

## 2019-11-14 ENCOUNTER — Other Ambulatory Visit: Payer: Self-pay | Admitting: Surgical

## 2019-11-14 NOTE — Telephone Encounter (Signed)
Please advise 

## 2019-11-28 LAB — HM DIABETES EYE EXAM

## 2019-12-08 ENCOUNTER — Other Ambulatory Visit: Payer: Self-pay | Admitting: Orthopedic Surgery

## 2019-12-08 DIAGNOSIS — M1711 Unilateral primary osteoarthritis, right knee: Secondary | ICD-10-CM

## 2019-12-08 NOTE — Telephone Encounter (Signed)
Please advise. Thanks.  

## 2020-01-15 ENCOUNTER — Other Ambulatory Visit: Payer: Self-pay | Admitting: Surgical

## 2020-01-15 DIAGNOSIS — M1711 Unilateral primary osteoarthritis, right knee: Secondary | ICD-10-CM

## 2020-01-15 NOTE — Telephone Encounter (Signed)
Pls advise.  

## 2020-01-27 ENCOUNTER — Other Ambulatory Visit: Payer: Self-pay | Admitting: *Deleted

## 2020-01-27 DIAGNOSIS — R7303 Prediabetes: Secondary | ICD-10-CM

## 2020-01-27 MED ORDER — METFORMIN HCL ER 500 MG PO TB24: 500.0000 mg | ORAL_TABLET | Freq: Every day | ORAL | 3 refills | Status: DC

## 2020-03-15 ENCOUNTER — Ambulatory Visit (INDEPENDENT_AMBULATORY_CARE_PROVIDER_SITE_OTHER): Payer: No Typology Code available for payment source | Admitting: Family Medicine

## 2020-03-15 ENCOUNTER — Encounter: Payer: Self-pay | Admitting: Family Medicine

## 2020-03-15 ENCOUNTER — Other Ambulatory Visit: Payer: Self-pay

## 2020-03-15 VITALS — BP 135/80 | HR 81 | Wt 258.6 lb

## 2020-03-15 DIAGNOSIS — Z1212 Encounter for screening for malignant neoplasm of rectum: Secondary | ICD-10-CM

## 2020-03-15 DIAGNOSIS — Z1211 Encounter for screening for malignant neoplasm of colon: Secondary | ICD-10-CM

## 2020-03-15 DIAGNOSIS — E7800 Pure hypercholesterolemia, unspecified: Secondary | ICD-10-CM | POA: Diagnosis not present

## 2020-03-15 DIAGNOSIS — E119 Type 2 diabetes mellitus without complications: Secondary | ICD-10-CM | POA: Diagnosis not present

## 2020-03-15 LAB — POCT GLYCOSYLATED HEMOGLOBIN (HGB A1C): Hemoglobin A1C: 6.4 % — AB (ref 4.0–5.6)

## 2020-03-15 MED ORDER — TRULICITY 0.75 MG/0.5ML ~~LOC~~ SOAJ: 0.7500 mg | SUBCUTANEOUS | 1 refills | Status: DC

## 2020-03-15 NOTE — Assessment & Plan Note (Signed)
Congratulated on weight loss.  We discussed at length.  Her A1c today is markedly well controlled.  She is up-to-date on her eye exam.  She is on a statin therapy.  Urine albumin to creatinine ratio ordered.  We will start Trulicity.  Patient would ultimately like to get off daily medication of Metformin if possible.

## 2020-03-15 NOTE — Progress Notes (Signed)
    SUBJECTIVE:   CHIEF COMPLAINT / HPI:   Alyssa Rangel 55 yo F with history of very well controlled type 2 diabetes, dyslipidemia, obesity, and knee osteoarthritis presenting today for    Diabetes The patient reports she is tolerating her Metformin therapy.  She is pleased with her weight loss.  She reports her changes in lifestyle that included increasing her water and decreasing her overall food intake.  She denies hypoglycemia polyuria or polydipsia.  She is most interested in medications which will help her lose weight   Knee Osteoarthritis The patient also has a concomitant right knee meniscal tear.  She reports is actually improving.  She takes Celebrex once to twice a week.  She also takes muscle relaxer once or twice a week.  She is able to walk at work and has no issues with locking catching or popping.  Denies falls   Medications  Updated and reviewed as appropriate PERTINENT  PMH / PSH/Family/Social History : Diabetes, BMI is 43  OBJECTIVE:   BP 135/80   Pulse 81   Wt 258 lb 9.6 oz (117.3 kg)   SpO2 95%   BMI 43.03 kg/m   Today's weight:  Last Weight  Most recent update: 03/15/2020  9:30 AM   Weight  117.3 kg (258 lb 9.6 oz)           Review of prior weights: [employer]   03/15/20 0929  Weight: 258 lb 9.6 oz (117.3 kg)    Cardiac: Regular rate and rhythm. Normal S1/S2. No murmurs, rubs, or gallops appreciated. Lungs: Clear bilaterally to ascultation.  Bilateral knees examined no erythema minimal effusion on right knee as compared to left Psych: Pleasant and appropriate    A1C 6.4   ASSESSMENT/PLAN:   Type 2 diabetes mellitus without complications ([place]) Congratulated on weight loss.  We discussed at length.  Her A1c today is markedly well controlled.  She is up-to-date on her eye exam.  She is on a statin therapy.  Urine albumin to creatinine ratio ordered.  We will start Trulicity.  Patient would ultimately like to get off daily medication of  Metformin if possible.  Hypercholesterolemia Repeat today as on rosuvastatin.     Right knee pain, secondary to osteoarthritis as well as meniscal tear.  Continue current therapy will check metabolic panel today given her use of Celebrex.  We discussed the appropriate dose of NSAIDs at length.   HCM discussed and ordered colonoscopy     [name], MD  [place]

## 2020-03-15 NOTE — Assessment & Plan Note (Signed)
Repeat today as on rosuvastatin.

## 2020-03-15 NOTE — Patient Instructions (Addendum)
It was wonderful to see you today.  Please bring ALL of your medications with you to every visit.   Today we talked about:   CONGRATULATIONS on your weight change--you should feel strong   START trulicity (same day your mom injects)--- call if severe nausea, try to take with a meal---a bit of nausea is normal and will typically go away after 1-2 doses  Go to the lab today--I will call you with results      Thank you for choosing [facility].   Please call [phone] with any questions about today's appointment.  Please be sure to schedule follow up at the front  desk before you leave today.   [name], MD  Family Medicine

## 2020-03-16 ENCOUNTER — Telehealth: Payer: Self-pay

## 2020-03-16 LAB — BASIC METABOLIC PANEL
BUN/Creatinine Ratio: 20 (ref 9–23)
BUN: 13 mg/dL (ref 6–24)
CO2: 24 mmol/L (ref 20–29)
Calcium: 9.5 mg/dL (ref 8.7–10.2)
Chloride: 102 mmol/L (ref 96–106)
Creatinine, Ser: 0.65 mg/dL (ref 0.57–1.00)
Glucose: 106 mg/dL — ABNORMAL HIGH (ref 65–99)
Potassium: 4.5 mmol/L (ref 3.5–5.2)
Sodium: 141 mmol/L (ref 134–144)
eGFR: 103 mL/min/{1.73_m2} (ref >59)

## 2020-03-16 LAB — LIPID PANEL
Chol/HDL Ratio: 6.6 ratio — ABNORMAL HIGH (ref 0.0–4.4)
Cholesterol, Total: 245 mg/dL — ABNORMAL HIGH (ref 100–199)
HDL: 37 mg/dL — ABNORMAL LOW (ref >39)
LDL Chol Calc (NIH): 187 mg/dL — ABNORMAL HIGH (ref 0–99)
Triglycerides: 118 mg/dL (ref 0–149)
VLDL Cholesterol Cal: 21 mg/dL (ref 5–40)

## 2020-03-16 NOTE — Telephone Encounter (Signed)
Patient calls nurse line stating Trulicity is unaffordable for her. Will forward to PCP for alternatives.

## 2020-03-17 ENCOUNTER — Telehealth: Payer: Self-pay | Admitting: Family Medicine

## 2020-03-17 LAB — MICROALBUMIN / CREATININE URINE RATIO
Creatinine, Urine: 104.8 mg/dL
Microalb/Creat Ratio: 130 mg/g{creat} — ABNORMAL HIGH (ref 0–29)
Microalbumin, Urine: 136.3 ug/mL

## 2020-03-17 NOTE — Telephone Encounter (Signed)
Attempted to call--left generic voicemail.  Rosendo Gros- could you see if there is a preferred GLP1 on this patient's plan? Would prefer weekly. Her mom uses Trulicity and she would really like this.   Let me know if questions. Thanks [name], MD  Family Medicine Teaching Service

## 2020-03-17 NOTE — Telephone Encounter (Signed)
Any suggestions? (I'm not sure what the GLP1 drugs are lol guess I should look them up). Their plan excludes this medication, they only cover tier 1 meds. They dont qualify for PAP due to having commercial insurance and the copay card for this med only works if the [employer] covers the medication.

## 2020-03-17 NOTE — Telephone Encounter (Signed)
Yeah, I called her plan and asked about it and they said that trulicity was excluded on her plan & that the plan she has only covers tier 1 meds, so she would be responsible for other drug costs. I've never heard of this either.

## 2020-03-17 NOTE — Telephone Encounter (Signed)
Attempted to call X1. Left generic voicemail.   Patient needs to increase statin, otherwise labs look quite good. Will call her on 3/10.  [name], MD  Family Medicine Teaching Service

## 2020-03-18 ENCOUNTER — Telehealth: Payer: Self-pay | Admitting: Family Medicine

## 2020-03-18 MED ORDER — ROSUVASTATIN CALCIUM 10 MG PO TABS: 10.0000 mg | ORAL_TABLET | Freq: Every day | ORAL | 3 refills | Status: DC

## 2020-03-18 MED ORDER — FLUTICASONE PROPIONATE 50 MCG/ACT NA SUSP: 2.0000 | Freq: Every day | NASAL | 6 refills | Status: DC

## 2020-03-18 NOTE — Telephone Encounter (Signed)
Good morning Dr. Owens Shark!  Please see info that [facility] acquired from [employer]. It appears we will be unable to give patient GLP.

## 2020-03-18 NOTE — Telephone Encounter (Signed)
Thanks for finding this---- Is there a patient assistance program we could look into?  Thanks! CB

## 2020-03-18 NOTE — Telephone Encounter (Signed)
Called with results. Increased rosuvastatin to 10 mg nightly. Discussed side effects.  Repeat direct LDL in 6 weeks--still quite elevated.  [name], MD  Family Medicine Teaching Service

## 2020-03-18 NOTE — Telephone Encounter (Signed)
The program for trulicity, [employer], requires you have no insurance or be a Part D member.

## 2020-03-24 ENCOUNTER — Telehealth: Payer: Self-pay

## 2020-03-24 NOTE — Telephone Encounter (Signed)
Left voicemail with patient informing her that no assistance for the Trulicity is available due to her insurance barriers. Let her know to call her PCP back for a different medication option.

## 2020-03-24 NOTE — Telephone Encounter (Signed)
Thank you can you please let the patient know about this insurance barrier?  [name], MD  Family Medicine Teaching Service

## 2020-03-24 NOTE — Telephone Encounter (Signed)
She would not qualify because of her insurance. Im sorry, I didn't clarify that in the message before! There is a copay card, but she would not qualify for that as well due to her primary/commercial insurance not covering the medication.

## 2020-03-26 ENCOUNTER — Other Ambulatory Visit: Payer: Self-pay | Admitting: Surgical

## 2020-03-26 DIAGNOSIS — M1711 Unilateral primary osteoarthritis, right knee: Secondary | ICD-10-CM

## 2020-03-29 ENCOUNTER — Encounter: Payer: Self-pay | Admitting: Gastroenterology

## 2020-04-01 ENCOUNTER — Telehealth: Payer: Self-pay

## 2020-04-01 NOTE — Telephone Encounter (Signed)
Opened in error

## 2020-04-14 ENCOUNTER — Ambulatory Visit (AMBULATORY_SURGERY_CENTER): Payer: Self-pay

## 2020-04-14 ENCOUNTER — Other Ambulatory Visit: Payer: Self-pay

## 2020-04-14 VITALS — Ht 65.0 in | Wt 255.0 lb

## 2020-04-14 DIAGNOSIS — Z1211 Encounter for screening for malignant neoplasm of colon: Secondary | ICD-10-CM

## 2020-04-14 MED ORDER — PEG 3350-KCL-NA BICARB-NACL 420 G PO SOLR: 4000.0000 mL | Freq: Once | ORAL | 0 refills | Status: AC

## 2020-04-14 NOTE — Progress Notes (Signed)
Denies allergies to eggs or soy products. Denies complication of anesthesia or sedation. Denies use of weight loss medication. Denies use of O2.   Emmi instructions given for colonoscopy.  

## 2020-04-28 ENCOUNTER — Encounter: Payer: Self-pay | Admitting: Gastroenterology

## 2020-04-28 ENCOUNTER — Other Ambulatory Visit: Payer: Self-pay

## 2020-04-28 ENCOUNTER — Ambulatory Visit (AMBULATORY_SURGERY_CENTER): Payer: No Typology Code available for payment source | Admitting: Gastroenterology

## 2020-04-28 VITALS — BP 140/78 | HR 72 | Temp 96.6°F | Resp 15 | Ht 65.0 in | Wt 255.0 lb

## 2020-04-28 DIAGNOSIS — Z1211 Encounter for screening for malignant neoplasm of colon: Secondary | ICD-10-CM | POA: Diagnosis present

## 2020-04-28 DIAGNOSIS — D123 Benign neoplasm of transverse colon: Secondary | ICD-10-CM

## 2020-04-28 MED ORDER — SODIUM CHLORIDE 0.9 % IV SOLN: 500.0000 mL | Freq: Once | INTRAVENOUS | Status: DC

## 2020-04-28 NOTE — Patient Instructions (Signed)
Handouts given for diverticulosis and polyps.  YOU HAD AN ENDOSCOPIC PROCEDURE TODAY AT [facility]:   Refer to the procedure report that was given to you for any specific questions about what was found during the examination.  If the procedure report does not answer your questions, please call your gastroenterologist to clarify.  If you requested that your care partner not be given the details of your procedure findings, then the procedure report has been included in a sealed envelope for you to review at your convenience later.  YOU SHOULD EXPECT: Some feelings of bloating in the abdomen. Passage of more gas than usual.  Walking can help get rid of the air that was put into your GI tract during the procedure and reduce the bloating. If you had a lower endoscopy (such as a colonoscopy or flexible sigmoidoscopy) you may notice spotting of blood in your stool or on the toilet paper. If you underwent a bowel prep for your procedure, you may not have a normal bowel movement for a few days.  Please Note:  You might notice some irritation and congestion in your nose or some drainage.  This is from the oxygen used during your procedure.  There is no need for concern and it should clear up in a day or so.  SYMPTOMS TO REPORT IMMEDIATELY:   Following lower endoscopy (colonoscopy):  Excessive amounts of blood in the stool  Significant tenderness or worsening of abdominal pains  Swelling of the abdomen that is new, acute  Fever of 100F or higher  For urgent or emergent issues, a gastroenterologist can be reached at any hour by calling [phone]. Do not use MyChart messaging for urgent concerns.    DIET:  We do recommend a small meal at first, but then you may proceed to your regular diet.  Drink plenty of fluids but you should avoid alcoholic beverages for 24 hours.  ACTIVITY:  You should plan to take it easy for the rest of today and you should NOT DRIVE or use heavy machinery  until tomorrow (because of the sedation medicines used during the test).    FOLLOW UP: Our staff will call the number listed on your records around 715 am on Friday 4/22,  following your procedure to check on you and address any questions or concerns that you may have regarding the information given to you following your procedure. If we do not reach you, we will leave a message.  We will attempt to reach you two times.  During this call, we will ask if you have developed any symptoms of COVID 19. If you develop any symptoms (ie: fever, flu-like symptoms, shortness of breath, cough etc.) before then, please call [phone].  If you test positive for Covid 19 in the 2 weeks post procedure, please call and report this information to [place].    If any biopsies were taken you will be contacted by phone or by letter within the next 1-3 weeks.  Please call us at [phone] if you have not heard about the biopsies in 3 weeks.    SIGNATURES/CONFIDENTIALITY: You and/or your care partner have signed paperwork which will be entered into your electronic medical record.  These signatures attest to the fact that that the information above on your After Visit Summary has been reviewed and is understood.  Full responsibility of the confidentiality of this discharge information lies with you and/or your care-partner.

## 2020-04-28 NOTE — Progress Notes (Signed)
Called to room to assist during endoscopic procedure.  Patient ID and intended procedure confirmed with present staff. Received instructions for my participation in the procedure from the performing physician.  

## 2020-04-28 NOTE — Op Note (Signed)
[facility] Patient Name: Alyssa Rangel Procedure Date: 04/28/2020 8:18 AM MRN: [id number] Endoscopist: Milus Banister , MD Age: 55 Referring MD:  Date of Birth: [birth year] Gender: Female Account #: [id number] Procedure:                Colonoscopy Indications:              Screening for colorectal malignant neoplasm Medicines:                Monitored Anesthesia Care Procedure:                Pre-Anesthesia Assessment:                           - Prior to the procedure, a History and Physical                            was performed, and patient medications and                            allergies were reviewed. The patient's tolerance of                            previous anesthesia was also reviewed. The risks                            and benefits of the procedure and the sedation                            options and risks were discussed with the patient.                            All questions were answered, and informed consent                            was obtained. Prior Anticoagulants: The patient has                            taken no previous anticoagulant or antiplatelet                            agents. ASA Grade Assessment: 3. After reviewing                            the risks and benefits, the patient was deemed in                            satisfactory condition to undergo the procedure.                           After obtaining informed consent, the colonoscope                            was passed under direct vision. Throughout the  procedure, the patient's blood pressure, pulse, and                            oxygen saturations were monitored continuously. The                            Olympus CF-HQ190L ([id number]) Colonoscope was                            introduced through the anus and advanced to the the                            cecum, identified by appendiceal orifice and                             ileocecal valve. The colonoscopy was performed                            without difficulty. The patient tolerated the                            procedure well. Scope In: 8:24:55 AM Scope Out: 8:40:14 AM Scope Withdrawal Time: 0 hours 10 minutes 49 seconds  Total Procedure Duration: 0 hours 15 minutes 19 seconds  Findings:                 A 2 mm polyp was found in the transverse colon. The                            polyp was sessile. The polyp was removed with a                            cold snare. Resection and retrieval were complete.                           Multiple small and large-mouthed diverticula were                            found in the left colon.                           The exam was otherwise without abnormality on                            direct and retroflexion views. Complications:            No immediate complications. Estimated blood loss:                            None. Estimated Blood Loss:     Estimated blood loss: none. Impression:               - One 2 mm polyp in the transverse colon, removed  with a cold snare. Resected and retrieved.                           - Diverticulosis in the left colon.                           - The examination was otherwise normal on direct                            and retroflexion views. Recommendation:           - Patient has a contact number available for                            emergencies. The signs and symptoms of potential                            delayed complications were discussed with the                            patient. Return to normal activities tomorrow.                            Written discharge instructions were provided to the                            patient.                           - Resume previous diet.                           - Continue present medications.                           - Await pathology results. Milus Banister, MD 04/28/2020 8:43:21  AM This report has been signed electronically.

## 2020-04-28 NOTE — Progress Notes (Signed)
Pt Drowsy. VSS. To PACU, report to RN. No anesthetic complications noted.  

## 2020-04-28 NOTE — Progress Notes (Signed)
VS by CW  I have reviewed the patient's medical history in detail and updated the computerized patient record.  

## 2020-04-30 ENCOUNTER — Telehealth: Payer: Self-pay

## 2020-04-30 NOTE — Telephone Encounter (Signed)
Left message on answering machine. 

## 2020-05-10 ENCOUNTER — Encounter: Payer: Self-pay | Admitting: Gastroenterology

## 2020-05-12 ENCOUNTER — Encounter: Payer: Self-pay | Admitting: Family Medicine

## 2020-05-12 DIAGNOSIS — K5790 Diverticulosis of intestine, part unspecified, without perforation or abscess without bleeding: Secondary | ICD-10-CM | POA: Insufficient documentation

## 2020-06-21 ENCOUNTER — Encounter: Payer: Self-pay | Admitting: Family Medicine

## 2020-06-21 ENCOUNTER — Ambulatory Visit (INDEPENDENT_AMBULATORY_CARE_PROVIDER_SITE_OTHER): Payer: No Typology Code available for payment source | Admitting: Family Medicine

## 2020-06-21 ENCOUNTER — Other Ambulatory Visit: Payer: Self-pay

## 2020-06-21 VITALS — BP 132/80 | Temp 78.0°F | Ht 65.0 in | Wt 256.0 lb

## 2020-06-21 DIAGNOSIS — E119 Type 2 diabetes mellitus without complications: Secondary | ICD-10-CM

## 2020-06-21 DIAGNOSIS — J4520 Mild intermittent asthma, uncomplicated: Secondary | ICD-10-CM

## 2020-06-21 DIAGNOSIS — Z23 Encounter for immunization: Secondary | ICD-10-CM | POA: Diagnosis not present

## 2020-06-21 DIAGNOSIS — R232 Flushing: Secondary | ICD-10-CM | POA: Diagnosis not present

## 2020-06-21 LAB — POCT GLYCOSYLATED HEMOGLOBIN (HGB A1C): HbA1c, POC (controlled diabetic range): 6.8 % (ref 0.0–7.0)

## 2020-06-21 MED ORDER — SHINGRIX 50 MCG/0.5ML IM SUSR: 0.5000 mL | INTRAMUSCULAR | 0 refills | Status: AC

## 2020-06-21 MED ORDER — ALBUTEROL SULFATE HFA 108 (90 BASE) MCG/ACT IN AERS: 2.0000 | INHALATION_SPRAY | Freq: Four times a day (QID) | RESPIRATORY_TRACT | 0 refills | Status: DC | PRN

## 2020-06-21 NOTE — Progress Notes (Addendum)
    SUBJECTIVE:   CHIEF COMPLAINT: hot flases HPI:   Alyssa Rangel is a 55 y.o. yo with history notable for well-controlled type 2 diabetes on metformin therapy and osteoarthritis presenting for hot flashes.  The patient reports her last menstrual period in 2013.  She reports she had hot flashes on and off for many years.  Over the past several months is gotten worse.  She notices associated mood changes with this.  She denies night sweats, unintentional weight loss cough swollen lymph nodes or other symptoms.  She has tried nothing for this.  She is interested in medication therapy.  The patient reports she has been taking her metformin.  She denies polyuria or polydipsia.  She wonders if she needs to continue taking this.  The patient reports a remote history of asthma.  She regularly carries an albuterol inhaler with her.  A few weeks ago at work she had an asthma exacerbation requiring nebulizer treatment and oxygen.  She reports this is a typical exacerbation with wheezing.  She denies chest pain, recurrence of wheezing nighttime cough.  She does endorse a rattling of her chest at night.  PERTINENT  PMH / PSH/Family/Social History : Updated and reviewed  OBJECTIVE:   BP 132/80   Temp (!) 78 F (25.6 C)   Ht 5\' 5"  (1.651 m)   Wt 256 lb (116.1 kg)   SpO2 95%   BMI 42.60 kg/m   Today's weight:  Last Weight  Most recent update: 06/21/2020  9:42 AM    Weight  116.1 kg (256 lb)            Review of prior weights: [employer]   06/21/20 0941  Weight: 256 lb (116.1 kg)     Cardiac: Regular rate and rhythm. Normal S1/S2. No murmurs, rubs, or gallops appreciated. Lungs: Clear bilaterally to ascultation.  Abdomen: Normoactive bowel sounds. No tenderness to deep or light palpation. No rebound or guarding.   Psych: Pleasant and appropriate  No cervical lymph adenopathy.  ASSESSMENT/PLAN:   Type 2 diabetes mellitus without complications (HCC) D1S remains well  controlled.  Continue current medication therapy metabolic panel today.  We will also obtain direct LDL given her diabetes.   Hot Flashes Up-to-date on mammogram.  She also had a recent colonoscopy without lesion.  She is up-to-date on her Pap smear.  This may represent menopause but will check for anemia and thyroid disease today.  She has no signs or symptoms suggestive of infectious disease and has had a negative HIV and hepatitis C in the past.  Dyslipidemia Check direct LDL today  History of RA on NSAID Repeat BMP  History of asthma to confirm diagnosis and ensure we are treating properly Will obtain PFTs with Dr. Graylin Shiver team.  HCM Shinglex Rx today   Consider GLP1 at follow up     [name], [place]

## 2020-06-21 NOTE — Assessment & Plan Note (Signed)
A1c remains well controlled.  Continue current medication therapy metabolic panel today.  We will also obtain direct LDL given her diabetes.

## 2020-06-21 NOTE — Patient Instructions (Signed)
It was wonderful to see you today.  Please bring ALL of your medications with you to every visit.   Today we talked about:  --Go to the lab to check blood work  --I will call you with results  -- I sent in an albuterol for you  -- please follow up for your lung testing  Things that can help with hot flashes - Wearing layers - Having a fan at work - We will check for other causes    Thank you for choosing [facility].   Please call [phone] with any questions about today's appointment.  Follow up in August-September (pending hot flashes)   [name], MD  Family Medicine

## 2020-06-22 LAB — BASIC METABOLIC PANEL
BUN/Creatinine Ratio: 17 (ref 9–23)
BUN: 13 mg/dL (ref 6–24)
CO2: 23 mmol/L (ref 20–29)
Calcium: 9.1 mg/dL (ref 8.7–10.2)
Chloride: 104 mmol/L (ref 96–106)
Creatinine, Ser: 0.75 mg/dL (ref 0.57–1.00)
Glucose: 121 mg/dL — ABNORMAL HIGH (ref 65–99)
Potassium: 4.5 mmol/L (ref 3.5–5.2)
Sodium: 140 mmol/L (ref 134–144)
eGFR: 93 mL/min/{1.73_m2} (ref >59)

## 2020-06-22 LAB — CBC
Hematocrit: 41.2 % (ref 34.0–46.6)
Hemoglobin: 13 g/dL (ref 11.1–15.9)
MCH: 26.5 pg — ABNORMAL LOW (ref 26.6–33.0)
MCHC: 31.6 g/dL (ref 31.5–35.7)
MCV: 84 fL (ref 79–97)
Platelets: 299 10*3/uL (ref 150–450)
RBC: 4.91 x10E6/uL (ref 3.77–5.28)
RDW: 13.1 % (ref 11.7–15.4)
WBC: 7.6 10*3/uL (ref 3.4–10.8)

## 2020-06-22 LAB — TSH: TSH: 1.18 u[IU]/mL (ref 0.450–4.500)

## 2020-06-22 LAB — LDL CHOLESTEROL, DIRECT: LDL Direct: 159 mg/dL — ABNORMAL HIGH (ref 0–99)

## 2020-06-23 ENCOUNTER — Telehealth: Payer: Self-pay | Admitting: Family Medicine

## 2020-06-23 MED ORDER — ROSUVASTATIN CALCIUM 20 MG PO TABS: 20.0000 mg | ORAL_TABLET | Freq: Every day | ORAL | 3 refills | Status: DC

## 2020-06-23 NOTE — Telephone Encounter (Signed)
Called patient, discussed cholesterol.  Statin increased, discussed.  Repeat full lipid panel ~ 2-3 months.  [name], MD  Family Medicine Teaching Service

## 2020-07-01 ENCOUNTER — Other Ambulatory Visit: Payer: Self-pay

## 2020-07-01 ENCOUNTER — Encounter: Payer: Self-pay | Admitting: Pharmacist

## 2020-07-01 ENCOUNTER — Ambulatory Visit (INDEPENDENT_AMBULATORY_CARE_PROVIDER_SITE_OTHER): Payer: No Typology Code available for payment source | Admitting: Pharmacist

## 2020-07-01 VITALS — BP 130/68 | HR 77 | Ht 64.0 in | Wt 259.0 lb

## 2020-07-01 DIAGNOSIS — J4520 Mild intermittent asthma, uncomplicated: Secondary | ICD-10-CM

## 2020-07-01 DIAGNOSIS — R0600 Dyspnea, unspecified: Secondary | ICD-10-CM | POA: Diagnosis not present

## 2020-07-01 MED ORDER — BUDESONIDE-FORMOTEROL FUMARATE 80-4.5 MCG/ACT IN AERO: 2.0000 | INHALATION_SPRAY | Freq: Every day | RESPIRATORY_TRACT | 3 refills | Status: DC

## 2020-07-01 NOTE — Patient Instructions (Addendum)
It was great meeting you today!  Start Symbicort (budesonide/formoterol) 80-4.5 mcg - 2 puffs once daily and as needed for shortness of breath or wheezing. Remember to rinse and spit with water after each use.

## 2020-07-01 NOTE — Progress Notes (Signed)
   S:    Patient arrives in good spirits. Presents for lung function evaluation.   Patient was referred and last seen by Primary Care Provider on 06/21/2020.   Patient reports breathing has been good and is at baseline today. Patient reports atopic sx consistent with allergic rhinitis.  Reports history of Asthma for about ~10 years but has never used inhalers. Symptoms include coughing and shortness of breath especially in hot weather. Patient reports she had a breathing episode (shortness of breath) at work last month. Patient used albuterol inhaler, breathing treatment and oxygen which provided relief in ~15 minutes, in ~1 hour patient was back to baseline, and after 6 hours patient had no breathing issues.  Patient reports adherence to medications Patient reports last dose of asthma medications was none (has not used since picked up from pharmacy) Current asthma medications: albuterol PRN Rescue inhaler use frequency: none  Level of asthma sx control- in the last 4 weeks: Question Scoring Patient Score  Daytime sx more than twice a week Yes (1) No (0) No  Any nighttime waking due to asthma Yes (1) No (0) No  Reliever needed >2x/week Yes (1) No (0) No  Any activity limitation due to asthma Yes (1) No (0) No   Total Score 0  Well controlled - 0, partly controlled - 1-2, uncontrolled 3-4   O: Physical Exam Vitals reviewed.  Constitutional:      Appearance: Normal appearance. She is obese.  Neurological:     Mental Status: She is oriented to person, place, and time.  Psychiatric:        Mood and Affect: Mood normal.        Behavior: Behavior normal.        Thought Content: Thought content normal.        Judgment: Judgment normal.    Review of Systems  All other systems reviewed and are negative.  Vitals:   07/01/20 1538  BP: 130/68  Pulse: 77  SpO2: 96%    See "scanned report" or Documentation Flowsheet (discrete results - PFTs) for  Spirometry results. Patient  provided good effort while attempting spirometry.   Lung Age = 68 Albuterol Neb  Lot# [id number]     Exp. Aug 2023  A/P: Patient undiagnosed with asthma and has been experiencing shortness of breath for ~10 years. No history of asthma medication use in the past. Former smoker ( 1 ppd x25 years - quit in 2013). Spirometry evaluation reveals  possible  restrictive lung disease. Post nebulized albuterol tx revealed normal spirometry with no significant pre-post change.  -Started Symbicort (budesonide/formoterol) 80-4.5 mg - 1 puff once daily and as needed for SOB/wheezing -Educated patient on purpose, proper use, potential adverse effects including risk of esophageal candidiasis and need to rinse mouth after each use.  -Reviewed results of pulmonary function tests.    Patient verbalized understanding of results and education.  Written pt instructions provided.   F/U Rx Clinic visit ~5 weeks  Total time in face to face counseling 40 minutes.  Patient seen with Caren Macadam PharmD Candidate, Romilda Garret, PharmD - PGY-1 Resident, and Lorel Monaco, PharmD, BCPS - PGY-2 Resident.  Marland Kitchen

## 2020-07-02 DIAGNOSIS — R0600 Dyspnea, unspecified: Secondary | ICD-10-CM | POA: Insufficient documentation

## 2020-07-02 NOTE — Assessment & Plan Note (Signed)
Patient undiagnosed with asthma and has been experiencing shortness of breath for ~10 years. No history of asthma medication use in the past. Former smoker ( 1 ppd x25 years - quit in 2013). Spirometry evaluation reveals possible restrictive lung disease. Post nebulized albuterol tx revealed normal spirometry with no significant pre-post change.  -Started Symbicort (budesonide/formoterol) 80-4.5 mg - 1 puff once daily and as needed for SOB/wheezing -Educated patient on purpose, proper use, potential adverse effects including risk of esophageal candidiasis and need to rinse mouth after each use.  -Reviewed results of pulmonary function tests.

## 2020-07-02 NOTE — Progress Notes (Signed)
Reviewed: I agree with Dr. Koval's documentation and management. 

## 2020-08-05 ENCOUNTER — Encounter: Payer: Self-pay | Admitting: Pharmacist

## 2020-08-05 ENCOUNTER — Ambulatory Visit (INDEPENDENT_AMBULATORY_CARE_PROVIDER_SITE_OTHER): Payer: No Typology Code available for payment source | Admitting: Pharmacist

## 2020-08-05 ENCOUNTER — Other Ambulatory Visit: Payer: Self-pay

## 2020-08-05 DIAGNOSIS — R0600 Dyspnea, unspecified: Secondary | ICD-10-CM | POA: Diagnosis not present

## 2020-08-05 NOTE — Assessment & Plan Note (Signed)
Patient undiagnosed with asthma and has been experiencing shortness of breath for ~10 years. No history of asthma medication use in the past. Former smoker ( 1 ppd x25 years - quit in 2013). Spirometry evaluation completed on 07/01/20 revealed possible restrictive lung disease. Post nebulized albuterol tx revealed normal spirometry with no significant pre-post change. Given minimal asthma symptoms and only requiring use of rescue inhaler once since last visit combined with unaffordable maintenance inhaler, will discontinue Symbicort and continue using albuterol as needed.  -Continue using albuterol inhaler as needed for SOB, wheezing -Patient will call if she starts having worsening symptoms but will otherwise follow up with her PCP

## 2020-08-05 NOTE — Patient Instructions (Signed)
Nice to see you.   No change in treatment plan.    Follow-up with Dr. Owens Shark.

## 2020-08-05 NOTE — Progress Notes (Signed)
   S:    Patient arrives in good spirits. Presents for lung function evaluation. Patient was referred and last seen by Primary Care Provider on 06/21/2020.   Previously reported history of Asthma for about ~10 years but has never used inhalers. Symptoms include coughing and shortness of breath especially in hot weather. Last seen by pharmacy clinic 07/01/20 at which time Symbicort was started. Today, she reports she has not picked up or started Symbicort due to cost being $240 at the pharmacy. She has only required albuterol once in the last month. 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 Otherwise she reports her breathing is doing well and is not inhibiting her activity.   Patient reports last dose of albuterol was none ~2 weeks ago Current asthma medications: albuterol PRN Rescue inhaler use frequency: none  Level of asthma sx control- in the last 4 weeks: Question Scoring Patient Score  Daytime sx more than twice a week Yes (1) No (0) No  Any nighttime waking due to asthma Yes (1) No (0) No  Reliever needed >2x/week Yes (1) No (0) No  Any activity limitation due to asthma Yes (1) No (0) No   Total Score 0  Well controlled - 0, partly controlled - 1-2, uncontrolled 3-4   O: Physical Exam Vitals reviewed.  Constitutional:      Appearance: Normal appearance. She is obese.  Neurological:     Mental Status: She is oriented to person, place, and time.  Psychiatric:        Mood and Affect: Mood normal.        Behavior: Behavior normal.        Thought Content: Thought content normal.        Judgment: Judgment normal.    Review of Systems  All other systems reviewed and are negative.  There were no vitals filed for this visit.  A/P: Patient undiagnosed with asthma and has been experiencing shortness of breath for ~10 years. No history of asthma medication use in the past.  Former smoker ( 1 ppd x25 years - quit in 2013). Spirometry evaluation completed on 07/01/20 revealed  possible  restrictive lung disease. Post nebulized albuterol tx revealed normal spirometry with no significant pre-post change. Given minimal asthma symptoms and only requiring use of rescue inhaler once since last visit combined with unaffordable maintenance inhaler, will discontinue Symbicort and continue using albuterol as needed.  -Continue using albuterol inhaler as needed for SOB, wheezing -Patient will call if she starts having worsening symptoms but will otherwise follow up with her PCP   Patient verbalized understanding of results and education.  Written pt instructions provided.    F/U with PCP. Total time in face to face counseling 12 minutes.  Patient seen with Alyssa Rangel, PharmD - PGY2 Pharmacy Resident.   Alyssa Rangel

## 2020-08-06 NOTE — Progress Notes (Signed)
Reviewed: I agree with Dr. Koval's documentation and management. 

## 2020-08-30 ENCOUNTER — Encounter: Payer: Self-pay | Admitting: Family Medicine

## 2020-08-30 ENCOUNTER — Other Ambulatory Visit: Payer: Self-pay

## 2020-08-30 ENCOUNTER — Ambulatory Visit (INDEPENDENT_AMBULATORY_CARE_PROVIDER_SITE_OTHER): Payer: No Typology Code available for payment source

## 2020-08-30 ENCOUNTER — Ambulatory Visit (INDEPENDENT_AMBULATORY_CARE_PROVIDER_SITE_OTHER): Payer: No Typology Code available for payment source | Admitting: Family Medicine

## 2020-08-30 VITALS — BP 132/74 | HR 75 | Wt 261.6 lb

## 2020-08-30 DIAGNOSIS — M1711 Unilateral primary osteoarthritis, right knee: Secondary | ICD-10-CM | POA: Diagnosis not present

## 2020-08-30 DIAGNOSIS — E119 Type 2 diabetes mellitus without complications: Secondary | ICD-10-CM

## 2020-08-30 DIAGNOSIS — R0600 Dyspnea, unspecified: Secondary | ICD-10-CM

## 2020-08-30 DIAGNOSIS — Z23 Encounter for immunization: Secondary | ICD-10-CM

## 2020-08-30 MED ORDER — CELECOXIB 100 MG PO CAPS: 100.0000 mg | ORAL_CAPSULE | Freq: Every day | ORAL | 0 refills | Status: DC | PRN

## 2020-08-30 MED ORDER — SEMAGLUTIDE(0.25 OR 0.5MG/DOS) 2 MG/1.5ML ~~LOC~~ SOPN: 0.2500 mg | PEN_INJECTOR | SUBCUTANEOUS | 3 refills | Status: DC

## 2020-08-30 MED ORDER — CYCLOBENZAPRINE HCL 10 MG PO TABS: 10.0000 mg | ORAL_TABLET | Freq: Every evening | ORAL | 0 refills | Status: DC | PRN

## 2020-08-30 MED ORDER — BD PEN NEEDLE MICRO U/F 32G X 6 MM MISC: 1 refills | Status: DC

## 2020-08-30 NOTE — Progress Notes (Signed)
    SUBJECTIVE:   CHIEF COMPLAINT: follow up for spirometry, weight concerns  HPI:   Alyssa Rangel is a 55 y.o. yo with history notable for type 2 diabetes (very well controlled)  presenting for check-in regarding her diabetes.  Her primary concern is actually her weight gain.  The patient recently celebrated her 55th birthday.  She reports she indulged in the month of July.  She reports she is unhappy with her weight.  She is going to try to give up fried foods and on more vegetables.  She is currently going to the gym twice per week.  She reports compliance with her metformin.  She is interested in medication therapy or alternative options to help with her weight as she has struggled with this for some time.  The patient reports ongoing wheezing.  She describes that when she gets hot at work or sometimes at night she finds her self wheezing.  She reports her breathing is a lot.  She endorses wheezing with exertion, denies chest pain, significant shortness of breath, lower extremity edema orthopnea or paroxysmal nocturnal dyspnea.  She is started albuterol with relief.  Recent spirometry with Dr. Valentina Lucks demonstrated possible restrictive findings.  She is a non-smoker.  The patient reports her hot flashes are not bothering her as much.  She has not been able to find the black cohosh.  She is interested in other supplements. PERTINENT  PMH / PSH/Family/Social History : Updated and reviewed as appropriate.  OBJECTIVE:   BP 132/74   Pulse 75   Wt 261 lb 9.6 oz (118.7 kg)   SpO2 97%   BMI 44.90 kg/m   Today's weight:  Last Weight  Most recent update: 08/30/2020  9:39 AM    Weight  118.7 kg (261 lb 9.6 oz)            Review of prior weights: [employer]   08/30/20 0939  Weight: 261 lb 9.6 oz (118.7 kg)     Cardiac: Regular rate and rhythm. Normal S1/S2. No murmurs, rubs, or gallops appreciated. Lungs: Clear bilaterally to ascultation.  No wheezes rales or rhonchi.  No  lower extremity edema. Abdomen: Normoactive bowel sounds. No tenderness to deep or light palpation. No rebound or guarding.   Psych: Pleasant and appropriate    ASSESSMENT/PLAN:   Intermittent Wheezing with restrictive lung findings on spirometry Will send for formal pulmonary function test and chest x-ray to look for interstitial lung disease.  Also considered the differential of CAD, less likely given primary symptoms of wheezing, no tachycardia, chest pain, palpitations suggestive of PE. If symptoms change, will obtain ischemic evaluation.   Menopausal hot flashes, will examine safety of over-the-counter medication Estroven  Type 2 Diabetes A1c not yet due and is has been quite well controlled.  Given her concerns about weight we discussed dietary and exercise changes.  Started GLP-1 agonist and discussed side effects.  She has no family history of medullary thyroid cancer.  She has no personal/family history of pancreatitis.  Discussed side effect of nausea.  OA refilled Celebrex which she uses only intermittently. HCM COVID today  Shingrix at [place], [place]

## 2020-08-30 NOTE — Progress Notes (Signed)
   Covid-19 Vaccination Clinic  Name:  Alyssa Rangel    MRN: [id number] DOB: 1966/01/09  08/30/2020  Alyssa Rangel was observed post Covid-19 immunization for 15 minutes without incident. She was provided with Vaccine Information Sheet and instruction to access the V-Safe system.   Alyssa Rangel was instructed to call 911 with any severe reactions post vaccine: Difficulty breathing  Swelling of face and throat  A fast heartbeat  A bad rash all over body  Dizziness and weakness

## 2020-08-30 NOTE — Patient Instructions (Addendum)
It was wonderful to see you today.  Please bring ALL of your medications with you to every visit.   Today we talked about:  -- Start semaglutide once a week--- 0.25 mg weekly---let me know if this is too hard to get or too costly---follow up in 4 weeks and we will increase the dose  - I will check into Estroven and message you  - For your breathing--we will get an X-ray and refer to Pulmonary Medicine  - You can go to [facility] for you chest x-ray    Thank you for choosing [facility].   Please call [phone] with any questions about today's appointment.  Please be sure to schedule follow up at the front  desk before you leave today.   [name], MD  Family Medicine

## 2020-08-30 NOTE — Assessment & Plan Note (Signed)
A1c not yet due and is has been quite well controlled.  Given her concerns about weight we discussed dietary and exercise changes.  Started GLP-1 agonist and discussed side effects.  She has no family history of medullary thyroid cancer.  She has no personal/family history of pancreatitis.  Discussed side effect of nausea.

## 2020-09-27 ENCOUNTER — Other Ambulatory Visit: Payer: Self-pay

## 2020-09-27 ENCOUNTER — Encounter: Payer: Self-pay | Admitting: Family Medicine

## 2020-09-27 ENCOUNTER — Ambulatory Visit (INDEPENDENT_AMBULATORY_CARE_PROVIDER_SITE_OTHER): Payer: No Typology Code available for payment source | Admitting: Family Medicine

## 2020-09-27 VITALS — BP 136/86 | HR 70 | Wt 258.2 lb

## 2020-09-27 DIAGNOSIS — Z23 Encounter for immunization: Secondary | ICD-10-CM

## 2020-09-27 DIAGNOSIS — E7800 Pure hypercholesterolemia, unspecified: Secondary | ICD-10-CM | POA: Diagnosis not present

## 2020-09-27 DIAGNOSIS — R519 Headache, unspecified: Secondary | ICD-10-CM | POA: Diagnosis not present

## 2020-09-27 DIAGNOSIS — E119 Type 2 diabetes mellitus without complications: Secondary | ICD-10-CM

## 2020-09-27 LAB — POCT GLYCOSYLATED HEMOGLOBIN (HGB A1C): HbA1c, POC (controlled diabetic range): 6.8 % (ref 0.0–7.0)

## 2020-09-27 NOTE — Patient Instructions (Signed)
It was wonderful to see you today.  Please bring ALL of your medications with you to every visit.   Today we talked about: --I will reach out the pharmacy team about the semaglutide  - Call Pulmonary medicine at [phone]  -- Please have [place] listen to your lungs if you have an asthma like attack   -- Please monitor your blood pressure at home 3 days per week  --- Call your eye doctor for a visit  - Follow up in 3 months  - I will call with results of your cholesterol    Thank you for choosing [facility].   Please call [phone] with any questions about today's appointment.  Please be sure to schedule follow up at the front  desk before you leave today.   [name], MD  Family Medicine

## 2020-09-27 NOTE — Progress Notes (Signed)
    SUBJECTIVE:   CHIEF COMPLAINT: intermittent headaches  HPI:   Alyssa Rangel is a 55 y.o. yo with history notable for type 2 diabetes, obesity, and hyperlipidemia presenting for routine follow up.  Posterior Headaches The patient reports new intermittent headaches.  These are posterior in nature.  These occur intermittently and at work.  They last a few minutes but are very severe.  She denies nausea, vomiting, headaches that wake her up at night, sensitivity to light or sound.  She reports she has a prior history of headaches.  She has tried nothing for this.  She is not sure what is triggering them.  She has not gotten her vision checked recently.  She does note that her blood pressure is intermittently elevated and wonders if this could be contributing.  She denies chest pain, numbness, weakness, dyspnea or lower extremity edema.  She reports overall she feels quite well.   Type 2 Diabetes  The patient was unable to pick up her semaglutide is on backorder.  She reports compliance with her medications.  She is pleased with her weight loss.  She denies polyuria or polydipsia.    PERTINENT  PMH / PSH/Family/Social History : Updated and reviewed as appropriate.  The patient does not desire an influenza vaccine today.  She had significant side effects for 2 weeks from her COVID-vaccine.  OBJECTIVE:   BP 136/86   Pulse 70   Wt 258 lb 3.2 oz (117.1 kg)   SpO2 98%   BMI 44.32 kg/m   Today's weight:  Last Weight  Most recent update: 09/27/2020  9:39 AM    Weight  117.1 kg (258 lb 3.2 oz)            Review of prior weights: Filed Weights   09/27/20 0938  Weight: 258 lb 3.2 oz (117.1 kg)    Pleasant appearing woman in no distress.  Regular rate and rhythm.  No murmurs rubs or gallops.  Lungs clear bilaterally to auscultation. Cranial  nerves II through XII were tested and fully intact.  Normal shrug.  Normal gait.  She has preserved upper extremity  strength. ASSESSMENT/PLAN:   Type 2 diabetes mellitus without complications (HCC) Semaglutide on backorder per patient.  Will reach out to pharmacy to see if there is any available stock anywhere. Continue metformin therapy.  Congratulated on A1c and weight loss today.  We will repeat lipid panel today.   Headaches, this is a new problem this appears to be relatively chronic in nature.  Most likely this represents a tension type headache.  I also considered a migraine but this is less likely given it is not unilateral pulsatile or associated with classic symptoms.  This could represent a manifestation of her elevated blood pressure.  She is to monitor her blood pressure 3 times a week for the next several weeks.  She will message me with results.  Other etiologies that are less likely include an intracranial process however she has a normal cranial nerve and neurologic testing and normal gait and no red flag symptoms.  If this persists will refer to neurology and consider advanced imaging.  Healthcare maintenance items, she is going to schedule a shingles vaccine at her pharmacy.  She is going to call her eye doctor.  She declined to influenza vaccine today.   [name], [place]

## 2020-09-27 NOTE — Assessment & Plan Note (Signed)
Semaglutide on backorder per patient.  Will reach out to pharmacy to see if there is any available stock anywhere. Continue metformin therapy.  Congratulated on A1c and weight loss today.  We will repeat lipid panel today.

## 2020-09-28 LAB — LDL CHOLESTEROL, DIRECT: LDL Direct: 192 mg/dL — ABNORMAL HIGH (ref 0–99)

## 2020-10-20 ENCOUNTER — Other Ambulatory Visit: Payer: Self-pay | Admitting: Family Medicine

## 2020-10-20 ENCOUNTER — Encounter: Payer: Self-pay | Admitting: Family Medicine

## 2020-10-20 DIAGNOSIS — E119 Type 2 diabetes mellitus without complications: Secondary | ICD-10-CM

## 2020-10-20 MED ORDER — SEMAGLUTIDE(0.25 OR 0.5MG/DOS) 2 MG/1.5ML ~~LOC~~ SOPN: 0.2500 mg | PEN_INJECTOR | SUBCUTANEOUS | 3 refills | Status: DC

## 2020-10-22 ENCOUNTER — Telehealth: Payer: Self-pay

## 2020-10-22 NOTE — Telephone Encounter (Signed)
Spoke with pt regarding samples of medication. Will come to pickup today after 3pm.  Medication Samples have been provided to the patient.  Drug name: OZEMPIC       Strength: 2MG  PEN        Qty: 1PEN  LOT: [id number]  Exp.Date: 02/09/2023  Dosing instructions: INJECT 0.25MG  ONCE WEEKLY FOR 4 WEEKS, THEN INCREASE TO 0.5MG .  Vista Deck 9:10 AM 10/22/2020

## 2020-11-11 MED ORDER — TRULICITY 0.75 MG/0.5ML ~~LOC~~ SOAJ: 0.7500 mg | SUBCUTANEOUS | 0 refills | Status: DC

## 2020-11-11 NOTE — Addendum Note (Signed)
Addended by: Leavy Cella on: 11/11/2020 03:34 PM   Modules accepted: Orders

## 2020-11-11 NOTE — Progress Notes (Signed)
Contacted patient to follow-up on glycemic control and breathing.   Patient reports taking Ozempic (semaglutide) 0.25mg  once weekly.  Notes improved glycemic control.   Patient also reports breathing has been improved since visit a few months ago.   We discussed formulary coverage of GLPs and I shared that I would have Hortencia Pilar, CPhT contact her with options on continuing GLP therapy.    *Discussed with Rosendo Gros and sent new Rx to pharmacy for Trulicity 0.75mg  once weekly.

## 2020-11-12 ENCOUNTER — Other Ambulatory Visit: Payer: Self-pay | Admitting: Pharmacist

## 2020-11-12 DIAGNOSIS — B3731 Acute candidiasis of vulva and vagina: Secondary | ICD-10-CM

## 2020-11-12 DIAGNOSIS — E119 Type 2 diabetes mellitus without complications: Secondary | ICD-10-CM

## 2020-11-12 MED ORDER — FLUCONAZOLE 150 MG PO TABS: 150.0000 mg | ORAL_TABLET | Freq: Once | ORAL | 0 refills | Status: AC

## 2020-11-12 NOTE — Assessment & Plan Note (Signed)
Increased dose to 0.5mg 

## 2020-11-12 NOTE — Progress Notes (Signed)
I have reviewed this visit and agree with the documentation.   

## 2020-11-12 NOTE — Progress Notes (Signed)
Phone call to patient RE glycemic control and medication supply.   Patient reports taking Ozempic (semaglutide) 0.25mg  once weekly with only minimal nausea for the first 1-2 doses. Her 4th dose is schedule in a few days.  We discussed supply and her need for increasing dose.   I agreed to supply sample Ozempic #2 pens for increased dose of 0.5mg  once weekly.   Medication Samples have been provided to the patient.  Drug name: Ozempic (semaglutide)       Strength: 0.5mg  pen        Qty: 2 pens (1 monthsupply)  LOT: [id number]  Exp.Date: 03/09/2023  Dosing instructions: 0.5mg  once weekly.   The patient has been instructed regarding the correct time, dose, and frequency of taking this medication, including desired effects and most common side effects.   Alyssa Rangel 10:52 AM 11/12/2020   Patient also reported symptoms of itching and believes it is a yeast infection.  We discussed treatment with fluconazole. Dr. Owens Shark agreed and new prescription was provided.    She will return to see Dr. Owens Shark within the next month.  Plans to schedule appointment soon.

## 2020-11-16 NOTE — Progress Notes (Signed)
I agree with Dr Koval's assessment and plan. 

## 2021-06-14 ENCOUNTER — Encounter: Payer: Self-pay | Admitting: *Deleted

## 2022-01-05 ENCOUNTER — Inpatient Hospital Stay (HOSPITAL_COMMUNITY)
Admission: EM | Admit: 2022-01-05 | Discharge: 2022-01-09 | DRG: 193 | Disposition: A | Discharge status: Home / Self Care | Payer: Self-pay | Attending: Internal Medicine | Admitting: Internal Medicine

## 2022-01-05 ENCOUNTER — Emergency Department (HOSPITAL_COMMUNITY): Payer: Self-pay

## 2022-01-05 ENCOUNTER — Other Ambulatory Visit: Payer: Self-pay

## 2022-01-05 ENCOUNTER — Encounter (HOSPITAL_COMMUNITY): Payer: Self-pay

## 2022-01-05 DIAGNOSIS — R0902 Hypoxemia: Secondary | ICD-10-CM

## 2022-01-05 DIAGNOSIS — R109 Unspecified abdominal pain: Secondary | ICD-10-CM | POA: Insufficient documentation

## 2022-01-05 DIAGNOSIS — E7800 Pure hypercholesterolemia, unspecified: Secondary | ICD-10-CM | POA: Diagnosis present

## 2022-01-05 DIAGNOSIS — Z6841 Body Mass Index (BMI) 40.0 and over, adult: Secondary | ICD-10-CM

## 2022-01-05 DIAGNOSIS — Z1152 Encounter for screening for COVID-19: Secondary | ICD-10-CM

## 2022-01-05 DIAGNOSIS — Z820 Family history of epilepsy and other diseases of the nervous system: Secondary | ICD-10-CM

## 2022-01-05 DIAGNOSIS — E119 Type 2 diabetes mellitus without complications: Secondary | ICD-10-CM

## 2022-01-05 DIAGNOSIS — J45901 Unspecified asthma with (acute) exacerbation: Secondary | ICD-10-CM | POA: Insufficient documentation

## 2022-01-05 DIAGNOSIS — Z8261 Family history of arthritis: Secondary | ICD-10-CM

## 2022-01-05 DIAGNOSIS — I10 Essential (primary) hypertension: Secondary | ICD-10-CM | POA: Diagnosis present

## 2022-01-05 DIAGNOSIS — Z87891 Personal history of nicotine dependence: Secondary | ICD-10-CM

## 2022-01-05 DIAGNOSIS — R1012 Left upper quadrant pain: Secondary | ICD-10-CM | POA: Diagnosis present

## 2022-01-05 DIAGNOSIS — Z833 Family history of diabetes mellitus: Secondary | ICD-10-CM

## 2022-01-05 DIAGNOSIS — J9601 Acute respiratory failure with hypoxia: Secondary | ICD-10-CM | POA: Insufficient documentation

## 2022-01-05 DIAGNOSIS — J9811 Atelectasis: Secondary | ICD-10-CM | POA: Diagnosis present

## 2022-01-05 DIAGNOSIS — Z8249 Family history of ischemic heart disease and other diseases of the circulatory system: Secondary | ICD-10-CM

## 2022-01-05 DIAGNOSIS — J101 Influenza due to other identified influenza virus with other respiratory manifestations: Principal | ICD-10-CM | POA: Diagnosis present

## 2022-01-05 DIAGNOSIS — E66813 Obesity, class 3: Secondary | ICD-10-CM | POA: Insufficient documentation

## 2022-01-05 LAB — URINALYSIS, ROUTINE W REFLEX MICROSCOPIC
Bilirubin Urine: NEGATIVE
Glucose, UA: NEGATIVE mg/dL
Hgb urine dipstick: NEGATIVE
Ketones, ur: NEGATIVE mg/dL
Leukocytes,Ua: NEGATIVE
Nitrite: NEGATIVE
Protein, ur: NEGATIVE mg/dL
Specific Gravity, Urine: 1.01 (ref 1.005–1.030)
pH: 6 (ref 5.0–8.0)

## 2022-01-05 LAB — COMPREHENSIVE METABOLIC PANEL
ALT: 29 U/L (ref 0–44)
AST: 23 U/L (ref 15–41)
Albumin: 3.6 g/dL (ref 3.5–5.0)
Alkaline Phosphatase: 55 U/L (ref 38–126)
Anion gap: 8 (ref 5–15)
BUN: 10 mg/dL (ref 6–20)
CO2: 25 mmol/L (ref 22–32)
Calcium: 8.4 mg/dL — ABNORMAL LOW (ref 8.9–10.3)
Chloride: 102 mmol/L (ref 98–111)
Creatinine, Ser: 0.58 mg/dL (ref 0.44–1.00)
GFR, Estimated: >60 mL/min (ref >60)
Glucose, Bld: 144 mg/dL — ABNORMAL HIGH (ref 70–99)
Potassium: 4.2 mmol/L (ref 3.5–5.1)
Sodium: 135 mmol/L (ref 135–145)
Total Bilirubin: 0.4 mg/dL (ref 0.3–1.2)
Total Protein: 7.2 g/dL (ref 6.5–8.1)

## 2022-01-05 LAB — LIPASE, BLOOD: Lipase: 29 U/L (ref 11–51)

## 2022-01-05 LAB — RESP PANEL BY RT-PCR (RSV, FLU A&B, COVID)  RVPGX2
Influenza A by PCR: POSITIVE — AB
Influenza B by PCR: NEGATIVE
Resp Syncytial Virus by PCR: NEGATIVE
SARS Coronavirus 2 by RT PCR: NEGATIVE

## 2022-01-05 LAB — GLUCOSE, CAPILLARY
Glucose-Capillary: 158 mg/dL — ABNORMAL HIGH (ref 70–99)
Glucose-Capillary: 217 mg/dL — ABNORMAL HIGH (ref 70–99)

## 2022-01-05 LAB — CBC
HCT: 39 % (ref 36.0–46.0)
Hemoglobin: 12.1 g/dL (ref 12.0–15.0)
MCH: 27.1 pg (ref 26.0–34.0)
MCHC: 31 g/dL (ref 30.0–36.0)
MCV: 87.2 fL (ref 80.0–100.0)
Platelets: 251 10*3/uL (ref 150–400)
RBC: 4.47 MIL/uL (ref 3.87–5.11)
RDW: 13.7 % (ref 11.5–15.5)
WBC: 6.2 10*3/uL (ref 4.0–10.5)
nRBC: 0 % (ref 0.0–0.2)

## 2022-01-05 LAB — TROPONIN I (HIGH SENSITIVITY)
Troponin I (High Sensitivity): 6 ng/L (ref <18)
Troponin I (High Sensitivity): 8 ng/L (ref <18)

## 2022-01-05 MED ORDER — PROCHLORPERAZINE EDISYLATE 10 MG/2ML IJ SOLN
10.0000 mg | Freq: Four times a day (QID) | INTRAMUSCULAR | Status: DC | PRN
  Administered 2022-01-05 – 2022-01-08 (×9): 10 mg via INTRAVENOUS
  Filled 2022-01-05 (×9): qty 2

## 2022-01-05 MED ORDER — INFLUENZA VAC SPLIT QUAD 0.5 ML IM SUSY: 0.5000 mL | PREFILLED_SYRINGE | INTRAMUSCULAR | Status: DC

## 2022-01-05 MED ORDER — PREDNISONE 20 MG PO TABS
40.0000 mg | ORAL_TABLET | Freq: Every day | ORAL | Status: DC
  Administered 2022-01-07 – 2022-01-09 (×3): 40 mg via ORAL
  Filled 2022-01-05 (×3): qty 2

## 2022-01-05 MED ORDER — INSULIN ASPART 100 UNIT/ML IJ SOLN
0.0000 [IU] | Freq: Every day | INTRAMUSCULAR | Status: DC
  Administered 2022-01-05: 2 [IU] via SUBCUTANEOUS
  Filled 2022-01-05: qty 0.05

## 2022-01-05 MED ORDER — OSELTAMIVIR PHOSPHATE 75 MG PO CAPS
75.0000 mg | ORAL_CAPSULE | Freq: Two times a day (BID) | ORAL | Status: DC
  Administered 2022-01-06 – 2022-01-07 (×3): 75 mg via ORAL
  Filled 2022-01-05 (×5): qty 1

## 2022-01-05 MED ORDER — MORPHINE SULFATE (PF) 4 MG/ML IV SOLN
4.0000 mg | INTRAVENOUS | Status: DC | PRN
  Administered 2022-01-05 – 2022-01-08 (×10): 4 mg via INTRAVENOUS
  Filled 2022-01-05 (×10): qty 1

## 2022-01-05 MED ORDER — INSULIN ASPART 100 UNIT/ML IJ SOLN
0.0000 [IU] | Freq: Three times a day (TID) | INTRAMUSCULAR | Status: DC
  Administered 2022-01-05 – 2022-01-06 (×2): 3 [IU] via SUBCUTANEOUS
  Administered 2022-01-06: 8 [IU] via SUBCUTANEOUS
  Administered 2022-01-06 – 2022-01-08 (×3): 3 [IU] via SUBCUTANEOUS
  Administered 2022-01-08: 2 [IU] via SUBCUTANEOUS
  Administered 2022-01-09: 3 [IU] via SUBCUTANEOUS
  Filled 2022-01-05: qty 0.15

## 2022-01-05 MED ORDER — IPRATROPIUM-ALBUTEROL 0.5-2.5 (3) MG/3ML IN SOLN
3.0000 mL | Freq: Four times a day (QID) | RESPIRATORY_TRACT | Status: DC
  Administered 2022-01-05 – 2022-01-07 (×7): 3 mL via RESPIRATORY_TRACT
  Filled 2022-01-05 (×7): qty 3

## 2022-01-05 MED ORDER — IPRATROPIUM-ALBUTEROL 0.5-2.5 (3) MG/3ML IN SOLN
3.0000 mL | Freq: Once | RESPIRATORY_TRACT | Status: AC
  Administered 2022-01-05: 3 mL via RESPIRATORY_TRACT
  Filled 2022-01-05: qty 3

## 2022-01-05 MED ORDER — OSELTAMIVIR PHOSPHATE 75 MG PO CAPS
75.0000 mg | ORAL_CAPSULE | Freq: Once | ORAL | Status: AC
  Administered 2022-01-05: 75 mg via ORAL
  Filled 2022-01-05: qty 1

## 2022-01-05 MED ORDER — OXYCODONE HCL 5 MG PO TABS
5.0000 mg | ORAL_TABLET | ORAL | Status: DC | PRN
  Administered 2022-01-05 – 2022-01-08 (×6): 5 mg via ORAL
  Filled 2022-01-05 (×7): qty 1

## 2022-01-05 MED ORDER — GUAIFENESIN ER 600 MG PO TB12
600.0000 mg | ORAL_TABLET | Freq: Two times a day (BID) | ORAL | Status: DC
  Administered 2022-01-05 – 2022-01-09 (×8): 600 mg via ORAL
  Filled 2022-01-05 (×8): qty 1

## 2022-01-05 MED ORDER — ACETAMINOPHEN 650 MG RE SUPP: 650.0000 mg | Freq: Four times a day (QID) | RECTAL | Status: DC | PRN

## 2022-01-05 MED ORDER — LOSARTAN POTASSIUM 50 MG PO TABS
50.0000 mg | ORAL_TABLET | Freq: Every day | ORAL | Status: DC
  Administered 2022-01-05 – 2022-01-09 (×5): 50 mg via ORAL
  Filled 2022-01-05 (×5): qty 1

## 2022-01-05 MED ORDER — METHYLPREDNISOLONE SODIUM SUCC 40 MG IJ SOLR
40.0000 mg | Freq: Two times a day (BID) | INTRAMUSCULAR | Status: AC
  Administered 2022-01-05 – 2022-01-06 (×2): 40 mg via INTRAVENOUS
  Filled 2022-01-05 (×2): qty 1

## 2022-01-05 MED ORDER — PNEUMOCOCCAL 20-VAL CONJ VACC 0.5 ML IM SUSY
0.5000 mL | PREFILLED_SYRINGE | INTRAMUSCULAR | Status: DC
  Filled 2022-01-05: qty 0.5

## 2022-01-05 MED ORDER — METHYLPREDNISOLONE SODIUM SUCC 125 MG IJ SOLR
125.0000 mg | Freq: Once | INTRAMUSCULAR | Status: AC
  Administered 2022-01-05: 125 mg via INTRAVENOUS
  Filled 2022-01-05: qty 2

## 2022-01-05 MED ORDER — ACETAMINOPHEN 325 MG PO TABS: 650.0000 mg | ORAL_TABLET | Freq: Four times a day (QID) | ORAL | Status: DC | PRN

## 2022-01-05 MED ORDER — BENZONATATE 100 MG PO CAPS
200.0000 mg | ORAL_CAPSULE | Freq: Three times a day (TID) | ORAL | Status: DC | PRN
  Administered 2022-01-06 (×2): 200 mg via ORAL
  Filled 2022-01-05 (×2): qty 2

## 2022-01-05 MED ORDER — HYDRALAZINE HCL 20 MG/ML IJ SOLN
10.0000 mg | Freq: Three times a day (TID) | INTRAMUSCULAR | Status: DC | PRN
  Administered 2022-01-05 – 2022-01-08 (×4): 10 mg via INTRAVENOUS
  Filled 2022-01-05 (×5): qty 1

## 2022-01-05 NOTE — ED Notes (Signed)
Pt 86% on room air, 2L [place] 96%

## 2022-01-05 NOTE — H&P (Signed)
History and Physical    Patient: Alyssa Rangel WYO:[id number] DOB: [birth year] DOA: 01/05/2022 DOS: the patient was seen and examined on 01/05/2022 PCP: Martyn Malay, MD  Patient coming from: Home  Chief Complaint:  Chief Complaint  Patient presents with   Abdominal Pain   Fever   Generalized Body Aches   Chills   HPI: Alyssa Rangel is a 56 y.o. female with medical history significant of asthma, HLD, DM2. Presenting with LUQ abdominal pain and body aches. She reports that she's had chills and body aches over the last 3 days. She has had subjective fevers. She has had increased cough and congestion. She has tried OTC meds to help, but they have not. She reports N/V last night and some LUQ abdominal pain. She reports sick contacts at work. When her symptoms did not improve, she decided to come to the ED for evaluation. She denies any other aggravating or alleviating factors.    Review of Systems: As mentioned in the history of present illness. All other systems reviewed and are negative. 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    Type 2 diabetes mellitus ([place])    Past Surgical History:  Procedure Laterality Date   BILATERAL SALPINGECTOMY  02/06/2012   Procedure: BILATERAL SALPINGECTOMY;  Surgeon: Delice Lesch, MD;  Location: [facility];  Service: Gynecology;  Laterality: Bilateral;  partial bilateral salpingectomy   CHOLECYSTECTOMY N/A 10/31/2012   Procedure: LAPAROSCOPIC CHOLECYSTECTOMY WITH INTRAOPERATIVE CHOLANGIOGRAM;  Surgeon: Edward Jolly, MD;  Location: [facility];   Service: General;  Laterality: N/A;   CYSTOSCOPY  02/06/2012   Procedure: CYSTOSCOPY;  Surgeon: Delice Lesch, MD;  Location: [facility];  Service: Gynecology;  Laterality: N/A;   DILITATION & CURRETTAGE/HYSTROSCOPY WITH NOVASURE ABLATION  02/06/2012   Procedure: DILATATION & CURETTAGE/HYSTEROSCOPY WITH NOVASURE ABLATION;  Surgeon: Delice Lesch, MD;  Location: [facility];  Service: Gynecology;  Laterality: N/A;   novasure   LAPAROSCOPY  02/06/2012   Procedure: LAPAROSCOPY OPERATIVE;  Surgeon: Delice Lesch, MD;  Location: [facility];  Service: Gynecology;  Laterality: N/A;   OVARIAN CYST REMOVAL  02/06/2012   Procedure: OVARIAN CYSTECTOMY;  Surgeon: Delice Lesch, MD;  Location: [facility];  Service: Gynecology;  Laterality: Bilateral;   TOTAL HIP ARTHROPLASTY Right 10/09/2012   Procedure: RIGHT TOTAL HIP ARTHROPLASTY;  Surgeon: Gearlean Alf, MD;  Location: [facility];  Service: Orthopedics;  Laterality: Right;   Social History:  reports that she quit smoking about 10 years ago. Her smoking use included cigarettes. She has a 25.00 pack-year smoking history. She has never used smokeless tobacco. She reports current alcohol use of about 1.0 standard drink of alcohol per week. 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  No Known Allergies  Family History  Problem Relation Age of Onset   Diabetes Mother        insulin dependent   Hypertension Mother    Other Father        unknown  Multiple sclerosis Sister    Heart disease Maternal Grandmother    Diabetes Maternal Grandmother    Hypertension Maternal Grandmother    Arthritis Maternal Grandmother    Cancer Neg Hx    Stroke Neg Hx    Colon cancer Neg Hx    Esophageal cancer Neg Hx    Rectal cancer Neg Hx    Stomach cancer Neg Hx     Prior to Admission medications   Medication Sig Start Date End Date Taking? Authorizing Provider  albuterol (VENTOLIN HFA) 108 (90 Base) MCG/ACT inhaler Inhale 2 puffs into the lungs every 6 (six) hours as needed  for wheezing or shortness of breath. Patient not taking: No sig reported 06/21/20   Martyn Malay, MD  celecoxib (CELEBREX) 100 MG capsule Take 1 capsule (100 mg total) by mouth daily as needed. 08/30/20   Martyn Malay, MD  Cholecalciferol (VITAMIN D3 PO) Take by mouth. Vitamin D 3 1000 units daily.    [provider]  cyclobenzaprine (FLEXERIL) 10 MG tablet Take 1 tablet (10 mg total) by mouth at bedtime as needed. 08/30/20   Martyn Malay, MD  Dulaglutide (TRULICITY) 0.86 VH/8.4ON SOPN Inject 0.75 mg into the skin once a week. 11/11/20   Zenia Resides, MD  fluticasone (FLONASE) 50 MCG/ACT nasal spray Place 2 sprays into both nostrils daily. 03/18/20   Martyn Malay, MD  Insulin Pen Needle (BD PEN NEEDLE MICRO U/F) 32G X 6 MM MISC Use once weekly with ozempic 08/30/20   Martyn Malay, MD  metFORMIN (GLUCOPHAGE-XR) 500 MG 24 hr tablet Take 1 tablet (500 mg total) by mouth daily with breakfast. 01/27/20   Martyn Malay, MD  rosuvastatin (CRESTOR) 20 MG tablet Take 1 tablet (20 mg total) by mouth daily. 06/23/20   Martyn Malay, MD  Semaglutide,0.25 or 0.'5MG'$ /DOS, 2 MG/1.5ML SOPN Inject 0.25 mg into the skin once a week. 0.25 mg once weekly for 4 weeks then increase to 0.5 mg weekly for at least 4 weeks,max 1 mg 10/20/20   Martyn Malay, MD  vitamin C (ASCORBIC ACID) 500 MG tablet Take 500 mg by mouth daily.    [provider]    Physical Exam: Vitals:   01/05/22 1057 01/05/22 1100 01/05/22 1254 01/05/22 1421  BP:   (!) 149/75   Pulse:   61 (!) 58  Resp:   18 17  Temp:   98.9 F (37.2 C)   TempSrc:   Oral   SpO2: 90%  97% 98%  Weight:  117.9 kg    Height:  '5\' 4"'$  (1.626 m)     General: 56 y.o. female resting in bed in NAD Eyes: PERRL, normal sclera ENMT: Nares patent w/o discharge, orophaynx clear, dentition normal, ears w/o discharge/lesions/ulcers Neck: Supple, trachea midline Cardiovascular: RRR, +S1, S2, no m/g/r, equal pulses throughout Respiratory:  diffuse wheeze, increased WOB on 2L [place] GI: BS+, ND, LUQ TTP, no masses noted, no organomegaly noted MSK: No e/c/c Neuro: A&O x 3, no focal deficits Psyc: Appropriate interaction and affect, calm/cooperative  Data Reviewed:  Results for orders placed or performed during the hospital encounter of 01/05/22 (from the past 24 hour(s))  Resp panel by RT-PCR (RSV, Flu A&B, Covid) Anterior Nasal Swab     Status: Abnormal   Collection Time: 01/05/22 11:31 AM   Specimen: Anterior Nasal Swab  Result Value Ref Range   SARS Coronavirus 2 by RT PCR NEGATIVE NEGATIVE   Influenza A by PCR POSITIVE (  A) NEGATIVE   Influenza B by PCR NEGATIVE NEGATIVE   Resp Syncytial Virus by PCR NEGATIVE NEGATIVE  Lipase, blood     Status: None   Collection Time: 01/05/22 11:37 AM  Result Value Ref Range   Lipase 29 11 - 51 U/L  Comprehensive metabolic panel     Status: Abnormal   Collection Time: 01/05/22 11:37 AM  Result Value Ref Range   Sodium 135 135 - 145 mmol/L   Potassium 4.2 3.5 - 5.1 mmol/L   Chloride 102 98 - 111 mmol/L   CO2 25 22 - 32 mmol/L   Glucose, Bld 144 (H) 70 - 99 mg/dL   BUN 10 6 - 20 mg/dL   Creatinine, Ser 0.58 0.44 - 1.00 mg/dL   Calcium 8.4 (L) 8.9 - 10.3 mg/dL   Total Protein 7.2 6.5 - 8.1 g/dL   Albumin 3.6 3.5 - 5.0 g/dL   AST 23 15 - 41 U/L   ALT 29 0 - 44 U/L   Alkaline Phosphatase 55 38 - 126 U/L   Total Bilirubin 0.4 0.3 - 1.2 mg/dL   GFR, Estimated >60 >60 mL/min   Anion gap 8 5 - 15  CBC     Status: None   Collection Time: 01/05/22 11:37 AM  Result Value Ref Range   WBC 6.2 4.0 - 10.5 K/uL   RBC 4.47 3.87 - 5.11 MIL/uL   Hemoglobin 12.1 12.0 - 15.0 g/dL   HCT 39.0 36.0 - 46.0 %   MCV 87.2 80.0 - 100.0 fL   MCH 27.1 26.0 - 34.0 pg   MCHC 31.0 30.0 - 36.0 g/dL   RDW 13.7 11.5 - 15.5 %   Platelets 251 150 - 400 K/uL   nRBC 0.0 0.0 - 0.2 %  Urinalysis, Routine w reflex microscopic     Status: None   Collection Time: 01/05/22  1:54 PM  Result Value Ref Range    Color, Urine YELLOW YELLOW   APPearance CLEAR CLEAR   Specific Gravity, Urine 1.010 1.005 - 1.030   pH 6.0 5.0 - 8.0   Glucose, UA NEGATIVE NEGATIVE mg/dL   Hgb urine dipstick NEGATIVE NEGATIVE   Bilirubin Urine NEGATIVE NEGATIVE   Ketones, ur NEGATIVE NEGATIVE mg/dL   Protein, ur NEGATIVE NEGATIVE mg/dL   Nitrite NEGATIVE NEGATIVE   Leukocytes,Ua NEGATIVE NEGATIVE  Troponin I (High Sensitivity)     Status: None   Collection Time: 01/05/22  2:24 PM  Result Value Ref Range   Troponin I (High Sensitivity) 6 <18 ng/L   CXR: Enlargement of cardiac silhouette. Chronic bibasilar atelectasis versus scarring. No acute abnormalities.  Assessment and Plan: Influenza A Asthma exacerbation Acute hypoxic respiratory failure     - admit to inpt, med-surg     - continue steroids, nebs, tamiflu     - add guaifenesin     - droplet precautions     - wean O2 as able  HTN     - not on meds at home     - start cozaar; PRN hydralazine  LUQ abdominal pain     - LFTs ok; lipase ok     - CT ab/pelvis pending  DM2     - A1c, SSI, glucose checks, DM diet  HLD     - continue home regimen when confirmed  Morbid obesity     - counsel on diet, lifestyle changes; outpt follow up  Advance Care Planning:   Code Status: FULL  Consults: None  Family Communication:  None at bedside  Severity of Illness: The appropriate patient status for this patient is INPATIENT. Inpatient status is judged to be reasonable and necessary in order to provide the required intensity of service to ensure the patient's safety. The patient's presenting symptoms, physical exam findings, and initial radiographic and laboratory data in the context of their chronic comorbidities is felt to place them at high risk for further clinical deterioration. Furthermore, it is not anticipated that the patient will be medically stable for discharge from the hospital within 2 midnights of admission.   * I certify that at the point of  admission it is my clinical judgment that the patient will require inpatient hospital care spanning beyond 2 midnights from the point of admission due to high intensity of service, high risk for further deterioration and high frequency of surveillance required.*  Author: Jonnie Finner, DO 01/05/2022 3:51 PM  For on call review [email or web address].

## 2022-01-05 NOTE — ED Provider Notes (Signed)
[facility] DEPT Provider Note   CSN: [id number] Arrival date & time: 01/05/22  1031     History  Chief Complaint  Patient presents with   Abdominal Pain   Fever   Generalized Body Aches   Chills    Alyssa Rangel is a 56 y.o. female.  The history is provided by the patient and medical records. No language interpreter was used.  Abdominal Pain Pain location:  LUQ Pain quality: aching and cramping   Pain radiates to:  Does not radiate Pain severity:  Mild Onset quality:  Gradual Duration:  1 day Timing:  Sporadic Progression:  Partially resolved Relieved by:  Nothing Worsened by:  Coughing Ineffective treatments:  None tried Associated symptoms: chest pain, chills, cough, fatigue, fever, nausea, shortness of breath and vomiting   Associated symptoms: no constipation, no diarrhea and no dysuria   Fever Associated symptoms: chest pain, chills, congestion, cough, nausea and vomiting   Associated symptoms: no confusion, no diarrhea, no dysuria, no headaches and no rash        Home Medications Prior to Admission medications   Medication Sig Start Date End Date Taking? Authorizing Provider  albuterol (VENTOLIN HFA) 108 (90 Base) MCG/ACT inhaler Inhale 2 puffs into the lungs every 6 (six) hours as needed for wheezing or shortness of breath. Patient not taking: No sig reported 06/21/20   Martyn Malay, MD  celecoxib (CELEBREX) 100 MG capsule Take 1 capsule (100 mg total) by mouth daily as needed. 08/30/20   Martyn Malay, MD  Cholecalciferol (VITAMIN D3 PO) Take by mouth. Vitamin D 3 1000 units daily.    [provider]  cyclobenzaprine (FLEXERIL) 10 MG tablet Take 1 tablet (10 mg total) by mouth at bedtime as needed. 08/30/20   Martyn Malay, MD  Dulaglutide (TRULICITY) 5.27 PO/2.4MP SOPN Inject 0.75 mg into the skin once a week. 11/11/20   Zenia Resides, MD  fluticasone (FLONASE) 50 MCG/ACT nasal spray Place 2 sprays  into both nostrils daily. 03/18/20   Martyn Malay, MD  Insulin Pen Needle (BD PEN NEEDLE MICRO U/F) 32G X 6 MM MISC Use once weekly with ozempic 08/30/20   Martyn Malay, MD  metFORMIN (GLUCOPHAGE-XR) 500 MG 24 hr tablet Take 1 tablet (500 mg total) by mouth daily with breakfast. 01/27/20   Martyn Malay, MD  rosuvastatin (CRESTOR) 20 MG tablet Take 1 tablet (20 mg total) by mouth daily. 06/23/20   Martyn Malay, MD  Semaglutide,0.25 or 0.'5MG'$ /DOS, 2 MG/1.5ML SOPN Inject 0.25 mg into the skin once a week. 0.25 mg once weekly for 4 weeks then increase to 0.5 mg weekly for at least 4 weeks,max 1 mg 10/20/20   Martyn Malay, MD  vitamin C (ASCORBIC ACID) 500 MG tablet Take 500 mg by mouth daily.    [provider]      Allergies    Patient has no known allergies.    Review of Systems   Review of Systems  Constitutional:  Positive for chills, fatigue and fever.  HENT:  Positive for congestion.   Eyes:  Negative for visual disturbance.  Respiratory:  Positive for cough, chest tightness, shortness of breath and wheezing.   Cardiovascular:  Positive for chest pain. Negative for palpitations and leg swelling.  Gastrointestinal:  Positive for abdominal pain, nausea and vomiting. Negative for constipation and diarrhea.  Genitourinary:  Negative for dysuria, flank pain and frequency.  Musculoskeletal:  Negative for back pain, neck  pain and neck stiffness.  Skin:  Negative for rash and wound.  Neurological:  Negative for weakness, light-headedness, numbness and headaches.  Psychiatric/Behavioral:  Negative for agitation and confusion.   All other systems reviewed and are negative.   Physical Exam Updated Vital Signs BP (!) 160/72 (BP Location: Right Arm)   Pulse 60   Temp 98.4 F (36.9 C) (Oral)   Resp 16   Ht '5\' 4"'$  (1.626 m)   Wt 117.9 kg   SpO2 90% Comment: Patient placed on O2 2L/min via [place] and sats increased to 95%  BMI 44.63 kg/m  Physical Exam Vitals and nursing note  reviewed.  Constitutional:      General: She is not in acute distress.    Appearance: She is well-developed. She is not ill-appearing, toxic-appearing or diaphoretic.  HENT:     Head: Normocephalic and atraumatic.     Mouth/Throat:     Mouth: Mucous membranes are moist.     Pharynx: Oropharynx is clear.  Eyes:     General: No scleral icterus.    Extraocular Movements: Extraocular movements intact.     Conjunctiva/sclera: Conjunctivae normal.     Pupils: Pupils are equal, round, and reactive to light.  Cardiovascular:     Rate and Rhythm: Normal rate and regular rhythm.     Heart sounds: No murmur heard. Pulmonary:     Effort: Pulmonary effort is normal. No respiratory distress.     Breath sounds: Wheezing and rhonchi present. No rales.  Chest:     Chest wall: No tenderness.  Abdominal:     General: Abdomen is flat. Bowel sounds are normal. There is no distension.     Palpations: Abdomen is soft.     Tenderness: There is no abdominal tenderness. There is no right CVA tenderness or left CVA tenderness.  Musculoskeletal:        General: No swelling.     Cervical back: Neck supple.  Skin:    General: Skin is warm and dry.     Capillary Refill: Capillary refill takes less than 2 seconds.  Neurological:     Mental Status: She is alert.  Psychiatric:        Mood and Affect: Mood normal.     ED Results / Procedures / Treatments   Labs (all labs ordered are listed, but only abnormal results are displayed) Labs Reviewed  RESP PANEL BY RT-PCR (RSV, FLU A&B, COVID)  RVPGX2 - Abnormal; Notable for the following components:      Result Value   Influenza A by PCR POSITIVE (*)    All other components within normal limits  COMPREHENSIVE METABOLIC PANEL - Abnormal; Notable for the following components:   Glucose, Bld 144 (*)    Calcium 8.4 (*)    All other components within normal limits  LIPASE, BLOOD  CBC  URINALYSIS, ROUTINE W REFLEX MICROSCOPIC  TROPONIN I (HIGH SENSITIVITY)   TROPONIN I (HIGH SENSITIVITY)    EKG EKG Interpretation  Date/Time:  Thursday January 05 2022 13:48:13 EST Ventricular Rate:  55 PR Interval:  157 QRS Duration: 83 QT Interval:  466 QTC Calculation: 446 R Axis:   25 Text Interpretation: Sinus rhythm when comapred to prior, similar appearance. No STEMI Confirmed by Antony Blackbird [phone]) on 01/05/2022 2:22:55 PM  Radiology DG Chest Portable 1 View  Result Date: 01/05/2022 CLINICAL DATA:  Positive for influenza, hypoxia on room air, productive cough, chest tightness, fever, chills, and body aches since 01/02/2022 EXAM: PORTABLE CHEST 1 VIEW  COMPARISON:  Portable exam 1316 hours compared to 06/04/2018 FINDINGS: Enlargement of cardiac silhouette. Mediastinal contours and pulmonary vascularity normal. Chronic bibasilar atelectasis versus scarring. No definite acute infiltrate, pleural effusion, or pneumothorax. Osseous structures unremarkable. IMPRESSION: Enlargement of cardiac silhouette. Chronic bibasilar atelectasis versus scarring. No acute abnormalities. Electronically Signed   By: Lavonia Dana M.D.   On: 01/05/2022 13:27    Procedures Procedures    CRITICAL CARE Performed by: Gwenyth Allegra Jull Harral Total critical care time: 35 minutes Critical care time was exclusive of separately billable procedures and treating other patients. Critical care was necessary to treat or prevent imminent or life-threatening deterioration. Critical care was time spent personally by me on the following activities: development of treatment plan with patient and/or surrogate as well as nursing, discussions with consultants, evaluation of patient's response to treatment, examination of patient, obtaining history from patient or surrogate, ordering and performing treatments and interventions, ordering and review of laboratory studies, ordering and review of radiographic studies, pulse oximetry and re-evaluation of patient's condition.  Medications Ordered in  ED Medications  oseltamivir (TAMIFLU) capsule 75 mg (has no administration in time range)  methylPREDNISolone sodium succinate (SOLU-MEDROL) 125 mg/2 mL injection 125 mg (125 mg Intravenous Given 01/05/22 1421)  ipratropium-albuterol (DUONEB) 0.5-2.5 (3) MG/3ML nebulizer solution 3 mL (3 mLs Nebulization Given 01/05/22 1421)    ED Course/ Medical Decision Making/ A&P                           Medical Decision Making Amount and/or Complexity of Data Reviewed Labs: ordered. Radiology: ordered.  Risk Prescription drug management. Decision regarding hospitalization.    Alyssa Rangel is a 57 y.o. female with a past medical history significant for self-reported asthma, diabetes, rheumatoid arthritis, hypercholesterolemia, chronic anemia, cholecystectomy, and osteoarthritis who presents with several days of congestion, cough, fatigue, chills, chest tightness, shortness of breath, and 1 night of upper abdominal pain with nausea and vomiting.  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  She says that overnight she started having nausea and vomiting and upper abdominal discomfort that was severe.  She denies any constipation, diarrhea, or urinary symptoms.  Denies leg pain or leg swelling.  No reported history of clotting disease.  Patient reports some chest tightness and shortness of breath with the coughing.  She reports a brown phlegm like sputum for the last few days.  According to nursing report from EMS, patient was given fentanyl for the abdominal pain and oxygen saturations dropped into the 70s.  On my exam, patient does have some wheezing and rhonchi in her lungs and chest and abdomen are nontender.  Good pulses in extremities.  Legs nontender not critically edematous.  Patient otherwise resting but was short of breath.  While patient was  being transferred from the triage room to her exam room, she was back on room air and oxygen dropped into the mid 80s at 86% as documented by charge nurse.  Patient now again on 2 L to maintain oxygen saturations.  She does not take oxygen at home.  Patient workup started in triage including labs and a viral illness swab.  Patient was found to be flu a positive as she suspected.  Due to the patient's hypoxia, productive cough, shortness breath, chest tightness, we will get a chest x-ray  and also get a troponin and keep her on the cardiac monitor.  Will get EKG.  However given the patient is flu positivity and hypoxia she will need admission when workup is completed.  Otherwise her triage workup including CBC and CMP were overall reassuring.  Lipase not elevated where she was having the discomfort.  Suspect pain from the nausea and vomiting primarily.  Will call for admission after workup is completed.  Given the wheezing and hypoxia we will also give a dose of Solu-Medrol and a DuoNeb as she reports this helped her in the past with her asthma.  3:17 PM Labs continue to return.  Patient's troponin is negative initially.  X-ray shows an large cardiac silhouette and some atelectasis but no large pneumonia.  Due to the hypoxia, patient be called for admission for hypoxic respiratory failure in the setting of influenza.            Final Clinical Impression(s) / ED Diagnoses Final diagnoses:  Influenza A  Hypoxia    Clinical Impression: 1. Influenza A   2. Hypoxia     Disposition: Admit  This note was prepared with assistance of Dragon voice recognition software. Occasional wrong-word or sound-a-like substitutions may have occurred due to the inherent limitations of voice recognition software.     Chanoch Mccleery, Gwenyth Allegra, MD 01/05/22 1537

## 2022-01-05 NOTE — ED Provider Triage Note (Signed)
Emergency Medicine Provider Triage Evaluation Note  Alyssa Rangel , a 56 y.o. female  was evaluated in triage.  Pt complains of left upper quadrant abdominal pain.  Patient reports pain began around 2 AM last night, waking her from her sleep.  Patient states that she has had instances of nausea, vomiting since this time.  Patient denies any diarrhea, dysuria.  Patient also complaining of fevers, states that she has had a cold for the last 1 week.  Patient reports she has been taking OTC medications.    Review of Systems  Positive:  Negative:   Physical Exam  BP (!) 160/72 (BP Location: Right Arm)   Pulse 60   Temp 98.4 F (36.9 C) (Oral)   Resp 16   Ht '5\' 4"'$  (1.626 m)   Wt 117.9 kg   SpO2 90% Comment: Patient placed on O2 2L/min via [place] and sats increased to 95%  BMI 44.63 kg/m  Gen:   Awake, no distress   Resp:  Normal effort  MSK:   Moves extremities without difficulty  Other:    Medical Decision Making  Medically screening exam initiated at 11:18 AM.  Appropriate orders placed.  Alyssa Rangel was informed that the remainder of the evaluation will be completed by another provider, this initial triage assessment does not replace that evaluation, and the importance of remaining in the ED until their evaluation is complete.     Alyssa Cecil, PA-C 01/05/22 1119

## 2022-01-05 NOTE — ED Triage Notes (Signed)
Patient c/o fever, chills, and body aches since 12/25. Patient reports that she began having left upper abdominal pain that started last night.  EMS gave Fentanyl 100 mcg IV prior to arrival to the ED. Pain is now 0/10. EMS reports after giving Fentanyl patient's sats dropped to 70's and O2 2L/min via [place] applied and sats increased to 98%.

## 2022-01-06 DIAGNOSIS — R0902 Hypoxemia: Secondary | ICD-10-CM

## 2022-01-06 LAB — COMPREHENSIVE METABOLIC PANEL
ALT: 27 U/L (ref 0–44)
AST: 21 U/L (ref 15–41)
Albumin: 3.4 g/dL — ABNORMAL LOW (ref 3.5–5.0)
Alkaline Phosphatase: 55 U/L (ref 38–126)
Anion gap: 8 (ref 5–15)
BUN: 14 mg/dL (ref 6–20)
CO2: 27 mmol/L (ref 22–32)
Calcium: 8.7 mg/dL — ABNORMAL LOW (ref 8.9–10.3)
Chloride: 104 mmol/L (ref 98–111)
Creatinine, Ser: 0.7 mg/dL (ref 0.44–1.00)
GFR, Estimated: >60 mL/min (ref >60)
Glucose, Bld: 194 mg/dL — ABNORMAL HIGH (ref 70–99)
Potassium: 4.1 mmol/L (ref 3.5–5.1)
Sodium: 139 mmol/L (ref 135–145)
Total Bilirubin: 0.4 mg/dL (ref 0.3–1.2)
Total Protein: 7.1 g/dL (ref 6.5–8.1)

## 2022-01-06 LAB — CBC
HCT: 39.1 % (ref 36.0–46.0)
Hemoglobin: 12.2 g/dL (ref 12.0–15.0)
MCH: 27.3 pg (ref 26.0–34.0)
MCHC: 31.2 g/dL (ref 30.0–36.0)
MCV: 87.5 fL (ref 80.0–100.0)
Platelets: 272 10*3/uL (ref 150–400)
RBC: 4.47 MIL/uL (ref 3.87–5.11)
RDW: 13.6 % (ref 11.5–15.5)
WBC: 7.9 10*3/uL (ref 4.0–10.5)
nRBC: 0 % (ref 0.0–0.2)

## 2022-01-06 LAB — GLUCOSE, CAPILLARY
Glucose-Capillary: 170 mg/dL — ABNORMAL HIGH (ref 70–99)
Glucose-Capillary: 160 mg/dL — ABNORMAL HIGH (ref 70–99)
Glucose-Capillary: 255 mg/dL — ABNORMAL HIGH (ref 70–99)
Glucose-Capillary: 148 mg/dL — ABNORMAL HIGH (ref 70–99)

## 2022-01-06 LAB — HIV ANTIBODY (ROUTINE TESTING W REFLEX): HIV Screen 4th Generation wRfx: NONREACTIVE

## 2022-01-06 MED ORDER — ONDANSETRON HCL 4 MG/2ML IJ SOLN
4.0000 mg | Freq: Four times a day (QID) | INTRAMUSCULAR | Status: DC | PRN
  Administered 2022-01-06 – 2022-01-08 (×5): 4 mg via INTRAVENOUS
  Filled 2022-01-06 (×5): qty 2

## 2022-01-06 MED ORDER — PNEUMOCOCCAL 20-VAL CONJ VACC 0.5 ML IM SUSY: 0.5000 mL | PREFILLED_SYRINGE | INTRAMUSCULAR | Status: DC | PRN

## 2022-01-06 MED ORDER — IPRATROPIUM-ALBUTEROL 0.5-2.5 (3) MG/3ML IN SOLN
3.0000 mL | RESPIRATORY_TRACT | Status: DC | PRN
  Administered 2022-01-06: 3 mL via RESPIRATORY_TRACT
  Filled 2022-01-06: qty 3

## 2022-01-06 MED ORDER — INFLUENZA VAC SPLIT QUAD 0.5 ML IM SUSY: 0.5000 mL | PREFILLED_SYRINGE | INTRAMUSCULAR | Status: DC | PRN

## 2022-01-06 NOTE — Hospital Course (Signed)
56 y.o. female with medical history significant of asthma, HLD, DM2. Presenting with LUQ abdominal pain and body aches. She reports that she's had chills and body aches over the last 3 days. She has had subjective fevers. She has had increased cough and congestion. She has tried OTC meds to help, but they have not. She reports N/V last night and some LUQ abdominal pain. She reports sick contacts at work. When her symptoms did not improve, she decided to come to the ED for evaluation. She denies any other aggravating or alleviating factors.

## 2022-01-06 NOTE — Progress Notes (Signed)
Chaplain can complete Advanced Directive, Healthcare POA education, once precautions are removed.  Chaplain will follow-up.     01/06/22 1300  Clinical Encounter Type  Visited With Patient not available  Visit Type Initial;Social support

## 2022-01-06 NOTE — Progress Notes (Signed)
  Progress Note   Patient: Alyssa Rangel [id number]:[id number] DOB: Jan 03, 1966 DOA: 01/05/2022     1 DOS: the patient was seen and examined on 01/06/2022   Brief hospital course: 56 y.o. female with medical history significant of asthma, HLD, DM2. Presenting with LUQ abdominal pain and body aches. She reports that she's had chills and body aches over the last 3 days. She has had subjective fevers. She has had increased cough and congestion. She has tried OTC meds to help, but they have not. She reports N/V last night and some LUQ abdominal pain. She reports sick contacts at work. When her symptoms did not improve, she decided to come to the ED for evaluation. She denies any other aggravating or alleviating factors.   Assessment and Plan: Influenza A Asthma exacerbation Acute hypoxic respiratory failure -remains on 2LNC this AM, pt is O2 naive at baseline -cont continue steroids, nebs, tamiflu -pt reports increased secretions. Will order flutter valve -Wean O2 as tolerated   HTN     - not on meds at home     - started cozaar; PRN hydralazine -BP improved   LUQ abdominal pain     - LFTs ok; lipase ok     - CT ab/pelvis reviewed with patient. Findings notable for possible ovarian dermoid in L adnexa.  -Pt already follows Ob/Gyn and states she already has an upcoming scheduled appointment in the near future. Advised pt to f/u closely with Gyn    DM2     - A1c, SSI, glucose checks, DM diet   HLD     - continue home regimen when confirmed   Morbid obesity     - recommend diet/lifestyle modification      Subjective: Overall feeling somewhat better. Still coughing  Physical Exam: Vitals:   01/06/22 0840 01/06/22 1211 01/06/22 1331 01/06/22 1423  BP:   (!) 162/73   Pulse:   60   Resp:   18   Temp:   98.4 F (36.9 C)   TempSrc:   Oral   SpO2: 99% 99% 100% 99%  Weight:      Height:       General exam: Awake, laying in bed, in nad Respiratory system: Normal respiratory  effort, decreased BS, end-expiratory wheezing Cardiovascular system: regular rate, s1, s2 Gastrointestinal system: Soft, nondistended, positive BS Central nervous system: CN2-12 grossly intact, strength intact Extremities: Perfused, no clubbing Skin: Normal skin turgor, no notable skin lesions seen Psychiatry: Mood normal // no visual hallucinations   Data Reviewed:  Labs reviewed: Na 139, K 4.1, Cr 0.70  Family Communication: Pt in room, family not at bedside  Disposition: Status is: Inpatient Remains inpatient appropriate because: Severity of illness  Planned Discharge Destination: Home    Author: Marylu Lund, MD 01/06/2022 3:42 PM  For on call review [email or web address].

## 2022-01-06 NOTE — Progress Notes (Signed)
   01/05/22 2115  Vitals  Temp 98.2 F (36.8 C)  BP (!) 201/80  MAP (mmHg) 112  BP Location Left Arm  BP Method Automatic  Patient Position (if appropriate) Lying  Pulse Rate 75  Resp 17  MEWS COLOR  MEWS Score Color Yellow  Oxygen Therapy  SpO2 92 %  O2 Device Nasal Cannula  MEWS Score  MEWS Temp 0  MEWS Systolic 2  MEWS Pulse 0  MEWS RR 0  MEWS LOC 0  MEWS Score 2     PRN hydralazine given to pt, BP was recheck and pt is now a GREEN MEWS

## 2022-01-06 NOTE — Progress Notes (Signed)
  Transition of Care [facility]) Screening Note   Patient Details  Name: Alyssa Rangel Date of Birth: 10/22/1965   Transition of Care [facility]) CM/SW Contact:    Vassie Moselle, LCSW Phone Number: 01/06/2022, 3:53 PM    Transition of Care Department [facility]) has reviewed patient and no TOC needs have been identified at this time. We will continue to monitor patient advancement through interdisciplinary progression rounds. If new patient transition needs arise, please place a TOC consult.

## 2022-01-07 LAB — HEMOGLOBIN A1C
Hgb A1c MFr Bld: 7.3 % — ABNORMAL HIGH (ref 4.8–5.6)
Mean Plasma Glucose: 163 mg/dL

## 2022-01-07 LAB — GLUCOSE, CAPILLARY
Glucose-Capillary: 106 mg/dL — ABNORMAL HIGH (ref 70–99)
Glucose-Capillary: 120 mg/dL — ABNORMAL HIGH (ref 70–99)
Glucose-Capillary: 151 mg/dL — ABNORMAL HIGH (ref 70–99)
Glucose-Capillary: 138 mg/dL — ABNORMAL HIGH (ref 70–99)

## 2022-01-07 MED ORDER — IPRATROPIUM-ALBUTEROL 0.5-2.5 (3) MG/3ML IN SOLN
3.0000 mL | Freq: Two times a day (BID) | RESPIRATORY_TRACT | Status: DC
  Administered 2022-01-07 – 2022-01-09 (×4): 3 mL via RESPIRATORY_TRACT
  Filled 2022-01-07 (×4): qty 3

## 2022-01-07 MED ORDER — LACTATED RINGERS IV SOLN: INTRAVENOUS | Status: DC

## 2022-01-07 MED ORDER — AMLODIPINE BESYLATE 5 MG PO TABS
5.0000 mg | ORAL_TABLET | Freq: Every day | ORAL | Status: DC
  Administered 2022-01-07 – 2022-01-08 (×2): 5 mg via ORAL
  Filled 2022-01-07 (×2): qty 1

## 2022-01-07 MED ORDER — SODIUM CHLORIDE 0.9 % IV SOLN
12.5000 mg | Freq: Once | INTRAVENOUS | Status: AC
  Administered 2022-01-07: 12.5 mg via INTRAVENOUS
  Filled 2022-01-07: qty 12.5

## 2022-01-07 NOTE — Progress Notes (Signed)
  Progress Note   Patient: Alyssa Rangel [id number] DOB: 1965/06/29 DOA: 01/05/2022     2 DOS: the patient was seen and examined on 01/07/2022   Brief hospital course: 56 y.o. female with medical history significant of asthma, HLD, DM2. Presenting with LUQ abdominal pain and body aches. She reports that she's had chills and body aches over the last 3 days. She has had subjective fevers. She has had increased cough and congestion. She has tried OTC meds to help, but they have not. She reports N/V last night and some LUQ abdominal pain. She reports sick contacts at work. When her symptoms did not improve, she decided to come to the ED for evaluation. She denies any other aggravating or alleviating factors.   Assessment and Plan: Influenza A Asthma exacerbation Acute hypoxic respiratory failure -remains on 2LNC this AM, pt is O2 naive at baseline -cont continue steroids, nebs -cont flutter valve -Wean O2 as tolerated -Pt declines further tamiflu given intractable nausea/vomiting since overnight   HTN     - not on meds at home     - started cozaar; PRN hydralazine -BP improved   LUQ abdominal pain     - LFTs ok; lipase ok     - CT ab/pelvis reviewed with patient. Findings notable for possible ovarian dermoid in L adnexa.  -Pt already follows Ob/Gyn and states she already has an upcoming scheduled appointment in the near future. Advised pt to f/u closely with Gyn    DM2     - A1c, SSI, glucose checks, DM diet   HLD     - continue home regimen when confirmed   Morbid obesity     - recommend diet/lifestyle modification      Subjective: Nauseated and vomiting overnight. This AM, still nauseated  Physical Exam: Vitals:   01/06/22 2239 01/07/22 0524 01/07/22 0807 01/07/22 1405  BP: (!) 171/67 (!) 178/100  (!) 159/62  Pulse: 74 66  63  Resp:  18  20  Temp:  97.9 F (36.6 C)  99.1 F (37.3 C)  TempSrc:  Oral  Oral  SpO2:  98% 98% 99%  Weight:      Height:        General exam: Conversant, in no acute distress Respiratory system: normal chest rise, clear, no audible wheezing Cardiovascular system: regular rhythm, s1-s2 Gastrointestinal system: Nondistended, nontender, pos BS Central nervous system: No seizures, no tremors Extremities: No cyanosis, no joint deformities Skin: No rashes, no pallor Psychiatry: Affect normal // no auditory hallucinations    Data Reviewed:  There are no new results to review at this time.  Family Communication: Pt in room, family not at bedside  Disposition: Status is: Inpatient Remains inpatient appropriate because: Severity of illness  Planned Discharge Destination: Home    Author: Marylu Lund, MD 01/07/2022 6:33 PM  For on call review [email or web address].

## 2022-01-08 LAB — COMPREHENSIVE METABOLIC PANEL
ALT: 40 U/L (ref 0–44)
AST: 22 U/L (ref 15–41)
Albumin: 3.5 g/dL (ref 3.5–5.0)
Alkaline Phosphatase: 55 U/L (ref 38–126)
Anion gap: 8 (ref 5–15)
BUN: 16 mg/dL (ref 6–20)
CO2: 32 mmol/L (ref 22–32)
Calcium: 8.4 mg/dL — ABNORMAL LOW (ref 8.9–10.3)
Chloride: 97 mmol/L — ABNORMAL LOW (ref 98–111)
Creatinine, Ser: 0.61 mg/dL (ref 0.44–1.00)
GFR, Estimated: >60 mL/min (ref >60)
Glucose, Bld: 133 mg/dL — ABNORMAL HIGH (ref 70–99)
Potassium: 4.1 mmol/L (ref 3.5–5.1)
Sodium: 137 mmol/L (ref 135–145)
Total Bilirubin: 0.7 mg/dL (ref 0.3–1.2)
Total Protein: 7.2 g/dL (ref 6.5–8.1)

## 2022-01-08 LAB — GLUCOSE, CAPILLARY
Glucose-Capillary: 122 mg/dL — ABNORMAL HIGH (ref 70–99)
Glucose-Capillary: 149 mg/dL — ABNORMAL HIGH (ref 70–99)
Glucose-Capillary: 191 mg/dL — ABNORMAL HIGH (ref 70–99)

## 2022-01-08 MED ORDER — AMLODIPINE BESYLATE 10 MG PO TABS
10.0000 mg | ORAL_TABLET | Freq: Every day | ORAL | Status: DC
  Administered 2022-01-09: 10 mg via ORAL
  Filled 2022-01-08: qty 1

## 2022-01-08 MED ORDER — CLONIDINE HCL 0.1 MG PO TABS
0.2000 mg | ORAL_TABLET | Freq: Three times a day (TID) | ORAL | Status: DC | PRN
  Administered 2022-01-08: 0.2 mg via ORAL
  Filled 2022-01-08: qty 2

## 2022-01-08 MED ORDER — AMLODIPINE BESYLATE 5 MG PO TABS
5.0000 mg | ORAL_TABLET | Freq: Every day | ORAL | Status: AC
  Administered 2022-01-08: 5 mg via ORAL
  Filled 2022-01-08: qty 1

## 2022-01-08 MED ORDER — LABETALOL HCL 5 MG/ML IV SOLN: 5.0000 mg | INTRAVENOUS | Status: DC | PRN

## 2022-01-08 NOTE — Progress Notes (Signed)
  Progress Note   Patient: Alyssa Rangel [id number] DOB: 1965/11/26 DOA: 01/05/2022     3 DOS: the patient was seen and examined on 01/08/2022   Brief hospital course: 56 y.o. female with medical history significant of asthma, HLD, DM2. Presenting with LUQ abdominal pain and body aches. She reports that she's had chills and body aches over the last 3 days. She has had subjective fevers. She has had increased cough and congestion. She has tried OTC meds to help, but they have not. She reports N/V last night and some LUQ abdominal pain. She reports sick contacts at work. When her symptoms did not improve, she decided to come to the ED for evaluation. She denies any other aggravating or alleviating factors.   Assessment and Plan: Influenza A Asthma exacerbation Acute hypoxic respiratory failure -remains on 2LNC this AM, pt is O2 naive at baseline -cont continue steroids, nebs -cont flutter valve -Wean O2 as tolerated -Holding further tamiflu given nausea/vomiting   HTN, grade 3     - not on meds at home     - started cozaar; added norvasc -BP remains poorly controlled. Will increase norvasc to '10mg'$  -added RN clonidine and labetalol   LUQ abdominal pain     - LFTs ok; lipase ok     - CT ab/pelvis reviewed with patient. Findings notable for possible ovarian dermoid in L adnexa.  -Pt already follows Ob/Gyn and states she already has an upcoming scheduled appointment in the near future. Advised pt to f/u closely with Gyn    DM2     - A1c, SSI, glucose checks, DM diet   HLD     - continue home regimen when confirmed   Morbid obesity     - recommend diet/lifestyle modification      Subjective: Still nauseated this AM  Physical Exam: Vitals:   01/08/22 0549 01/08/22 0637 01/08/22 0828 01/08/22 1700  BP: (!) 170/82 (!) 178/87  (!) 209/76  Pulse: 66 69  78  Resp: 17   16  Temp: 98.3 F (36.8 C)   98.4 F (36.9 C)  TempSrc: Oral   Oral  SpO2: 90%  94% 94%  Weight:       Height:       General exam: Awake, laying in bed, in nad Respiratory system: Normal respiratory effort, no wheezing Cardiovascular system: regular rate, s1, s2 Gastrointestinal system: Soft, nondistended, positive BS Central nervous system: CN2-12 grossly intact, strength intact Extremities: Perfused, no clubbing Skin: Normal skin turgor, no notable skin lesions seen Psychiatry: Mood normal // no visual hallucinations    Data Reviewed:  There are no new results to review at this time.  Family Communication: Pt in room, family not at bedside  Disposition: Status is: Inpatient Remains inpatient appropriate because: Severity of illness  Planned Discharge Destination: Home    Author: Marylu Lund, MD 01/08/2022 5:10 PM  For on call review [email or web address]

## 2022-01-09 LAB — COMPREHENSIVE METABOLIC PANEL
ALT: 41 U/L (ref 0–44)
AST: 20 U/L (ref 15–41)
Albumin: 3.7 g/dL (ref 3.5–5.0)
Alkaline Phosphatase: 59 U/L (ref 38–126)
Anion gap: 9 (ref 5–15)
BUN: 16 mg/dL (ref 6–20)
CO2: 31 mmol/L (ref 22–32)
Calcium: 8.5 mg/dL — ABNORMAL LOW (ref 8.9–10.3)
Chloride: 96 mmol/L — ABNORMAL LOW (ref 98–111)
Creatinine, Ser: 0.71 mg/dL (ref 0.44–1.00)
GFR, Estimated: >60 mL/min (ref >60)
Glucose, Bld: 112 mg/dL — ABNORMAL HIGH (ref 70–99)
Potassium: 3.6 mmol/L (ref 3.5–5.1)
Sodium: 136 mmol/L (ref 135–145)
Total Bilirubin: 1.2 mg/dL (ref 0.3–1.2)
Total Protein: 7.6 g/dL (ref 6.5–8.1)

## 2022-01-09 LAB — CBC
HCT: 44.7 % (ref 36.0–46.0)
Hemoglobin: 14 g/dL (ref 12.0–15.0)
MCH: 27 pg (ref 26.0–34.0)
MCHC: 31.3 g/dL (ref 30.0–36.0)
MCV: 86.3 fL (ref 80.0–100.0)
Platelets: 353 10*3/uL (ref 150–400)
RBC: 5.18 MIL/uL — ABNORMAL HIGH (ref 3.87–5.11)
RDW: 13.4 % (ref 11.5–15.5)
WBC: 11 10*3/uL — ABNORMAL HIGH (ref 4.0–10.5)
nRBC: 0 % (ref 0.0–0.2)

## 2022-01-09 LAB — GLUCOSE, CAPILLARY
Glucose-Capillary: 114 mg/dL — ABNORMAL HIGH (ref 70–99)
Glucose-Capillary: 85 mg/dL (ref 70–99)
Glucose-Capillary: 148 mg/dL — ABNORMAL HIGH (ref 70–99)

## 2022-01-09 MED ORDER — LOSARTAN POTASSIUM 50 MG PO TABS: 50.0000 mg | ORAL_TABLET | Freq: Every day | ORAL | 0 refills | Status: DC

## 2022-01-09 MED ORDER — PREDNISONE 10 MG PO TABS: ORAL_TABLET | ORAL | 0 refills | Status: AC

## 2022-01-09 MED ORDER — AMLODIPINE BESYLATE 10 MG PO TABS: 10.0000 mg | ORAL_TABLET | Freq: Every day | ORAL | 0 refills | Status: DC

## 2022-01-09 MED ORDER — BENZONATATE 200 MG PO CAPS: 200.0000 mg | ORAL_CAPSULE | Freq: Three times a day (TID) | ORAL | 0 refills | Status: DC | PRN

## 2022-01-09 NOTE — Discharge Summary (Signed)
Physician Discharge Summary   Patient: Alyssa Rangel MRN: [id number] DOB: [birth year]  Admit date:     01/05/2022  Discharge date: 01/09/22  Discharge Physician: Marylu Lund   PCP: Martyn Malay, MD   Recommendations at discharge:    Follow up with PCP in 1-2 weeks Follow up with [facility] May need further titration of bp meds, goal of normotension  Discharge Diagnoses: Principal Problem:   Influenza A Active Problems:   Hypercholesterolemia   HTN (hypertension)   Type 2 diabetes mellitus without complications ([place])   Acute respiratory failure with hypoxia ([place])   Asthma exacerbation   Obesity, Class III, BMI 40-49.9 (morbid obesity) ([place])   Abdominal pain  Resolved Problems:   * No resolved hospital problems. *  Hospital Course: 57 y.o. female with medical history significant of asthma, HLD, DM2. Presenting with LUQ abdominal pain and body aches. She reports that she's had chills and body aches over the last 3 days. She has had subjective fevers. She has had increased cough and congestion. She has tried OTC meds to help, but they have not. She reports N/V last night and some LUQ abdominal pain. She reports sick contacts at work. When her symptoms did not improve, she decided to come to the ED for evaluation. She denies any other aggravating or alleviating factors.   Assessment and Plan: Influenza A Asthma exacerbation Acute hypoxic respiratory failure -Had required up to [facility] this visit with pt being O2 naive at baseline -Pt was cont on steroids, nebs -O2 was weaned to RA -Patient did receive total of 3 doses of tamiflu, however did not tolerate regimen secondary to intractable N/V. Symptoms resolved after stopping tamiflu and pt reported feeling great, ready to go home   HTN, grade 3     - not on meds at home     - started cozaar; added norvasc -BP now much improved -Recommend further titrating bp meds as tolerated. Recommend close outpt f/u with PCP    LUQ abdominal pain     - LFTs ok; lipase ok     - CT ab/pelvis reviewed with patient. Findings notable for possible ovarian dermoid in L adnexa.  -Pt already follows Ob/Gyn and initially stated she already has an upcoming scheduled appointment in the near future with GYN. However, at time of d/c, pt says she will arrange close f/u with Gyn on her own. Have provided contact info for [facility]   DM2     - A1c of 7.3 -Pt was continued on SSI while in hospital -Continue home regimen on d/c   HLD     - continue home regimen   Morbid obesity     - recommend diet/lifestyle modification       Consultants:  Procedures performed:   Disposition: Home Diet recommendation:  Carb modified diet DISCHARGE MEDICATION: Allergies as of 01/09/2022   No Known Allergies      Medication List     STOP taking these medications    celecoxib 100 MG capsule Commonly known as: CELEBREX   cyclobenzaprine 10 MG tablet Commonly known as: FLEXERIL   Semaglutide(0.25 or 0.'5MG'$ /DOS) 2 MG/1.5ML Sopn       TAKE these medications    albuterol 108 (90 Base) MCG/ACT inhaler Commonly known as: VENTOLIN HFA Inhale 2 puffs into the lungs every 6 (six) hours as needed for wheezing or shortness of breath.   amLODipine 10 MG tablet Commonly known as: NORVASC Take 1 tablet (10 mg total) by  mouth daily. Start taking on: January 10, 2022   ascorbic acid 500 MG tablet Commonly known as: VITAMIN C Take 500 mg by mouth daily.   BD Pen Needle Micro U/F 32G X 6 MM Misc Generic drug: Insulin Pen Needle Use once weekly with ozempic   benzonatate 200 MG capsule Commonly known as: TESSALON Take 1 capsule (200 mg total) by mouth 3 (three) times daily as needed for cough.   fluticasone 50 MCG/ACT nasal spray Commonly known as: FLONASE Place 2 sprays into both nostrils daily.   ibuprofen 200 MG tablet Commonly known as: ADVIL Take 200-600 mg by mouth every 6 (six) hours as needed for mild pain  or headache.   losartan 50 MG tablet Commonly known as: COZAAR Take 1 tablet (50 mg total) by mouth daily. Start taking on: January 10, 2022   metFORMIN 500 MG 24 hr tablet Commonly known as: GLUCOPHAGE-XR Take 1 tablet (500 mg total) by mouth daily with breakfast.   predniSONE 10 MG tablet Commonly known as: DELTASONE Take 4 tablets (40 mg total) by mouth daily with breakfast for 2 days, THEN 2 tablets (20 mg total) daily with breakfast for 2 days, THEN 1 tablet (10 mg total) daily with breakfast for 2 days, THEN 0.5 tablets (5 mg total) daily with breakfast for 2 days. Start taking on: January 09, 2022   rosuvastatin 20 MG tablet Commonly known as: Crestor Take 1 tablet (20 mg total) by mouth daily.   Trulicity 4.09 WJ/1.9JY Sopn Generic drug: Dulaglutide Inject 0.75 mg into the skin once a week.   Vitamin D3 1000 units Caps Take 1,000 Units by mouth daily.        Follow-up Information     Martyn Malay, MD Follow up in 1 week(s).   Specialty: Family Medicine Why: Hospital follow up Contact information: [place] [address] [phone]         [place] [facility]. Schedule an appointment as soon as possible for a visit.   Why: Hospital follow up Contact information: [address] [place] [phone]               Discharge Exam: Danley Danker Weights   01/05/22 1100  Weight: 117.9 kg   General exam: Awake, laying in bed, in nad Respiratory system: Normal respiratory effort, no wheezing Cardiovascular system: regular rate, s1, s2 Gastrointestinal system: Soft, nondistended, positive BS Central nervous system: CN2-12 grossly intact, strength intact Extremities: Perfused, no clubbing Skin: Normal skin turgor, no notable skin lesions seen Psychiatry: Mood normal // no visual hallucinations   Condition at discharge: fair  The results of significant diagnostics from this  hospitalization (including imaging, microbiology, ancillary and laboratory) are listed below for reference.   Imaging Studies: CT ABDOMEN PELVIS WO CONTRAST  Result Date: 01/05/2022 CLINICAL DATA:  Left-sided abdominal pain EXAM: CT ABDOMEN AND PELVIS WITHOUT CONTRAST TECHNIQUE: Multidetector CT imaging of the abdomen and pelvis was performed following the standard protocol without IV contrast. RADIATION DOSE REDUCTION: This exam was performed according to the departmental dose-optimization program which includes automated exposure control, adjustment of the mA and/or kV according to patient size and/or use of iterative reconstruction technique. COMPARISON:  11/06/2012 FINDINGS: Lower chest: There are linear patchy infiltrates in right middle lobe, lingula and both lower lobes. Hepatobiliary: No focal abnormalities are seen in liver. Surgical clips are seen in gallbladder fossa. There is no dilation of bile ducts. Pancreas: No focal  abnormalities are seen. Spleen: Unremarkable. Adrenals/Urinary Tract: Adrenals are unremarkable. There is no hydronephrosis. There are no renal or ureteral stones. Urinary bladder is not distended limiting its evaluation. Stomach/Bowel: Stomach is unremarkable. Small bowel loops are not dilated. Appendix is not dilated. Scattered diverticula are seen in colon without signs of focal diverticulitis. Vascular/Lymphatic: Scattered arterial calcifications are seen. Reproductive: Uterus is enlarged in size. There is 4.9 x 3.6 cm smooth marginated lesion with fat, fluid and soft tissue attenuation in the left adnexa which has not changed significantly suggesting possible ovarian dermoid. Other: There is no ascites or pneumoperitoneum. Umbilical hernia containing fat is seen. Musculoskeletal: Degenerative changes are noted in thoracic and lumbar spine. There is minimal anterolisthesis at L4-L5 level. There is previous right hip arthroplasty. IMPRESSION: There is no evidence of intestinal  obstruction or pneumoperitoneum. Appendix is unremarkable. There is no hydronephrosis. There are patchy infiltrates in both lower lung fields suggesting atelectasis/pneumonia. Part of this finding may suggest underlying scarring. Scattered diverticula are seen in colon without signs of diverticulitis. There is 4.9 cm smooth marginated lesion with fat and fluid attenuation in left adnexa suggesting possible ovarian dermoid. Follow-up pelvic sonogram may be considered. Other findings as described in the body of the report. Electronically Signed   By: Elmer Picker M.D.   On: 01/05/2022 18:18   DG Chest Portable 1 View  Result Date: 01/05/2022 CLINICAL DATA:  Positive for influenza, hypoxia on room air, productive cough, chest tightness, fever, chills, and body aches since 01/02/2022 EXAM: PORTABLE CHEST 1 VIEW COMPARISON:  Portable exam 1316 hours compared to 06/04/2018 FINDINGS: Enlargement of cardiac silhouette. Mediastinal contours and pulmonary vascularity normal. Chronic bibasilar atelectasis versus scarring. No definite acute infiltrate, pleural effusion, or pneumothorax. Osseous structures unremarkable. IMPRESSION: Enlargement of cardiac silhouette. Chronic bibasilar atelectasis versus scarring. No acute abnormalities. Electronically Signed   By: Lavonia Dana M.D.   On: 01/05/2022 13:27    Microbiology: Results for orders placed or performed during the hospital encounter of 01/05/22  Resp panel by RT-PCR (RSV, Flu A&B, Covid) Anterior Nasal Swab     Status: Abnormal   Collection Time: 01/05/22 11:31 AM   Specimen: Anterior Nasal Swab  Result Value Ref Range Status   SARS Coronavirus 2 by RT PCR NEGATIVE NEGATIVE Final    Comment: (NOTE) SARS-CoV-2 target nucleic acids are NOT DETECTED.  The SARS-CoV-2 RNA is generally detectable in upper respiratory specimens during the acute phase of infection. The lowest concentration of SARS-CoV-2 viral copies this assay can detect is 138 copies/mL.  A negative result does not preclude SARS-Cov-2 infection and should not be used as the sole basis for treatment or other patient management decisions. A negative result may occur with  improper specimen collection/handling, submission of specimen other than nasopharyngeal swab, presence of viral mutation(s) within the areas targeted by this assay, and inadequate number of viral copies(<138 copies/mL). A negative result must be combined with clinical observations, patient history, and epidemiological information. The expected result is Negative.  Fact Sheet for Patients:  EntrepreneurPulse.com.au  Fact Sheet for Healthcare Providers:  IncredibleEmployment.be  This test is no t yet approved or cleared by the [place] FDA and  has been authorized for detection and/or diagnosis of SARS-CoV-2 by FDA under an Emergency Use Authorization (EUA). This EUA will remain  in effect (meaning this test can be used) for the duration of the COVID-19 declaration under Section 564(b)(1) of the Act, 21 U.S.C.section 360bbb-3(b)(1), unless the authorization is terminated  or revoked sooner.  Influenza A by PCR POSITIVE (A) NEGATIVE Final   Influenza B by PCR NEGATIVE NEGATIVE Final    Comment: (NOTE) The Xpert Xpress SARS-CoV-2/FLU/RSV plus assay is intended as an aid in the diagnosis of influenza from Nasopharyngeal swab specimens and should not be used as a sole basis for treatment. Nasal washings and aspirates are unacceptable for Xpert Xpress SARS-CoV-2/FLU/RSV testing.  Fact Sheet for Patients: EntrepreneurPulse.com.au  Fact Sheet for Healthcare Providers: IncredibleEmployment.be  This test is not yet approved or cleared by the [place] FDA and has been authorized for detection and/or diagnosis of SARS-CoV-2 by FDA under an Emergency Use Authorization (EUA). This EUA will remain in effect (meaning this  test can be used) for the duration of the COVID-19 declaration under Section 564(b)(1) of the Act, 21 U.S.C. section 360bbb-3(b)(1), unless the authorization is terminated or revoked.     Resp Syncytial Virus by PCR NEGATIVE NEGATIVE Final    Comment: (NOTE) Fact Sheet for Patients: EntrepreneurPulse.com.au  Fact Sheet for Healthcare Providers: IncredibleEmployment.be  This test is not yet approved or cleared by the [place] FDA and has been authorized for detection and/or diagnosis of SARS-CoV-2 by FDA under an Emergency Use Authorization (EUA). This EUA will remain in effect (meaning this test can be used) for the duration of the COVID-19 declaration under Section 564(b)(1) of the Act, 21 U.S.C. section 360bbb-3(b)(1), unless the authorization is terminated or revoked.  Performed at [facility], [place] [address] [place], [place] [address]     Labs: CBC: Recent Labs  Lab 01/05/22 1137 01/06/22 0607 01/09/22 0846  WBC 6.2 7.9 11.0*  HGB 12.1 12.2 14.0  HCT 39.0 39.1 44.7  MCV 87.2 87.5 86.3  PLT 251 272 852   Basic Metabolic Panel: Recent Labs  Lab 01/05/22 1137 01/06/22 0607 01/08/22 0719 01/09/22 0846  NA 135 139 137 136  K 4.2 4.1 4.1 3.6  CL 102 104 97* 96*  CO2 25 27 32 31  GLUCOSE 144* 194* 133* 112*  BUN '10 14 16 16  '$ CREATININE 0.58 0.70 0.61 0.71  CALCIUM 8.4* 8.7* 8.4* 8.5*   Liver Function Tests: Recent Labs  Lab 01/05/22 1137 01/06/22 0607 01/08/22 0719 01/09/22 0846  AST '23 21 22 20  '$ ALT 29 27 40 41  ALKPHOS 55 55 55 59  BILITOT 0.4 0.4 0.7 1.2  PROT 7.2 7.1 7.2 7.6  ALBUMIN 3.6 3.4* 3.5 3.7   CBG: Recent Labs  Lab 01/08/22 1145 01/08/22 1657 01/08/22 2247 01/09/22 0819 01/09/22 1132  GLUCAP 149* 191* 114* 85 148*    Discharge time spent: less than 30 minutes.  Signed: Marylu Lund, MD [facility] 01/09/2022

## 2022-01-09 NOTE — Progress Notes (Signed)
Dc order recd. IV and tele dc per protocol. Pt tol well. DC paperwork given to pt who voices understanding. Pt voices no concerns or c/o. No distress noted. Pt by wheelchair to main entrance via wheelchair to return home via pov. All belongings accompany pt.

## 2022-01-10 ENCOUNTER — Telehealth: Payer: Self-pay

## 2022-01-10 NOTE — Telephone Encounter (Signed)
Transition Care Management Unsuccessful Follow-up Telephone Call  Date of discharge and from where:  [facility] 01/09/2022  Attempts:  1st Attempt  Reason for unsuccessful TCM follow-up call:  Left voice message Juanda Crumble, [place] Direct Dial [phone]

## 2022-01-11 ENCOUNTER — Encounter (HOSPITAL_COMMUNITY): Payer: Self-pay | Admitting: Emergency Medicine

## 2022-01-11 ENCOUNTER — Emergency Department (HOSPITAL_COMMUNITY): Payer: Self-pay

## 2022-01-11 ENCOUNTER — Other Ambulatory Visit: Payer: Self-pay

## 2022-01-11 ENCOUNTER — Emergency Department (HOSPITAL_COMMUNITY)
Admission: EM | Admit: 2022-01-11 | Discharge: 2022-01-11 | Disposition: A | Discharge status: Home / Self Care | Payer: Self-pay | Attending: Emergency Medicine | Admitting: Emergency Medicine

## 2022-01-11 DIAGNOSIS — N858 Other specified noninflammatory disorders of uterus: Secondary | ICD-10-CM | POA: Insufficient documentation

## 2022-01-11 DIAGNOSIS — D72829 Elevated white blood cell count, unspecified: Secondary | ICD-10-CM | POA: Insufficient documentation

## 2022-01-11 DIAGNOSIS — R1012 Left upper quadrant pain: Secondary | ICD-10-CM

## 2022-01-11 DIAGNOSIS — R030 Elevated blood-pressure reading, without diagnosis of hypertension: Secondary | ICD-10-CM | POA: Insufficient documentation

## 2022-01-11 DIAGNOSIS — E119 Type 2 diabetes mellitus without complications: Secondary | ICD-10-CM | POA: Insufficient documentation

## 2022-01-11 DIAGNOSIS — Z7984 Long term (current) use of oral hypoglycemic drugs: Secondary | ICD-10-CM | POA: Insufficient documentation

## 2022-01-11 DIAGNOSIS — R4182 Altered mental status, unspecified: Secondary | ICD-10-CM | POA: Insufficient documentation

## 2022-01-11 DIAGNOSIS — R102 Pelvic and perineal pain: Secondary | ICD-10-CM | POA: Insufficient documentation

## 2022-01-11 DIAGNOSIS — I10 Essential (primary) hypertension: Secondary | ICD-10-CM | POA: Insufficient documentation

## 2022-01-11 DIAGNOSIS — Z79899 Other long term (current) drug therapy: Secondary | ICD-10-CM | POA: Insufficient documentation

## 2022-01-11 DIAGNOSIS — Z794 Long term (current) use of insulin: Secondary | ICD-10-CM | POA: Insufficient documentation

## 2022-01-11 DIAGNOSIS — R112 Nausea with vomiting, unspecified: Secondary | ICD-10-CM | POA: Insufficient documentation

## 2022-01-11 LAB — CBC WITH DIFFERENTIAL/PLATELET
Abs Immature Granulocytes: 0.08 10*3/uL — ABNORMAL HIGH (ref 0.00–0.07)
Basophils Absolute: 0 10*3/uL (ref 0.0–0.1)
Basophils Relative: 0 %
Eosinophils Absolute: 0.1 10*3/uL (ref 0.0–0.5)
Eosinophils Relative: 1 %
HCT: 45.2 % (ref 36.0–46.0)
Hemoglobin: 14.3 g/dL (ref 12.0–15.0)
Immature Granulocytes: 1 %
Lymphocytes Relative: 25 %
Lymphs Abs: 3.9 10*3/uL (ref 0.7–4.0)
MCH: 27.5 pg (ref 26.0–34.0)
MCHC: 31.6 g/dL (ref 30.0–36.0)
MCV: 86.9 fL (ref 80.0–100.0)
Monocytes Absolute: 1 10*3/uL (ref 0.1–1.0)
Monocytes Relative: 7 %
Neutro Abs: 10.2 10*3/uL — ABNORMAL HIGH (ref 1.7–7.7)
Neutrophils Relative %: 66 %
Platelets: 413 10*3/uL — ABNORMAL HIGH (ref 150–400)
RBC: 5.2 MIL/uL — ABNORMAL HIGH (ref 3.87–5.11)
RDW: 13.6 % (ref 11.5–15.5)
WBC: 15.3 10*3/uL — ABNORMAL HIGH (ref 4.0–10.5)
nRBC: 0 % (ref 0.0–0.2)

## 2022-01-11 LAB — COMPREHENSIVE METABOLIC PANEL
ALT: 29 U/L (ref 0–44)
AST: 22 U/L (ref 15–41)
Albumin: 3.6 g/dL (ref 3.5–5.0)
Alkaline Phosphatase: 54 U/L (ref 38–126)
Anion gap: 13 (ref 5–15)
BUN: 15 mg/dL (ref 6–20)
CO2: 22 mmol/L (ref 22–32)
Calcium: 8.7 mg/dL — ABNORMAL LOW (ref 8.9–10.3)
Chloride: 99 mmol/L (ref 98–111)
Creatinine, Ser: 0.73 mg/dL (ref 0.44–1.00)
GFR, Estimated: >60 mL/min (ref >60)
Glucose, Bld: 158 mg/dL — ABNORMAL HIGH (ref 70–99)
Potassium: 4.5 mmol/L (ref 3.5–5.1)
Sodium: 134 mmol/L — ABNORMAL LOW (ref 135–145)
Total Bilirubin: 1.1 mg/dL (ref 0.3–1.2)
Total Protein: 7 g/dL (ref 6.5–8.1)

## 2022-01-11 LAB — I-STAT CHEM 8, ED
BUN: 16 mg/dL (ref 6–20)
Calcium, Ion: 1.04 mmol/L — ABNORMAL LOW (ref 1.15–1.40)
Chloride: 102 mmol/L (ref 98–111)
Creatinine, Ser: 0.6 mg/dL (ref 0.44–1.00)
Glucose, Bld: 159 mg/dL — ABNORMAL HIGH (ref 70–99)
HCT: 45 % (ref 36.0–46.0)
Hemoglobin: 15.3 g/dL — ABNORMAL HIGH (ref 12.0–15.0)
Potassium: 4.4 mmol/L (ref 3.5–5.1)
Sodium: 135 mmol/L (ref 135–145)
TCO2: 24 mmol/L (ref 22–32)

## 2022-01-11 LAB — LIPASE, BLOOD: Lipase: 35 U/L (ref 11–51)

## 2022-01-11 LAB — I-STAT BETA HCG BLOOD, ED (MC, WL, AP ONLY): I-stat hCG, quantitative: <5 m[IU]/mL (ref <5)

## 2022-01-11 LAB — CBG MONITORING, ED: Glucose-Capillary: 126 mg/dL — ABNORMAL HIGH (ref 70–99)

## 2022-01-11 LAB — MAGNESIUM: Magnesium: 2 mg/dL (ref 1.7–2.4)

## 2022-01-11 MED ORDER — IOHEXOL 350 MG/ML SOLN
75.0000 mL | Freq: Once | INTRAVENOUS | Status: AC | PRN
  Administered 2022-01-11: 75 mL via INTRAVENOUS

## 2022-01-11 MED ORDER — AMLODIPINE BESYLATE 5 MG PO TABS
10.0000 mg | ORAL_TABLET | Freq: Once | ORAL | Status: AC
  Administered 2022-01-11: 10 mg via ORAL
  Filled 2022-01-11: qty 2

## 2022-01-11 MED ORDER — HALOPERIDOL LACTATE 5 MG/ML IJ SOLN
2.0000 mg | Freq: Once | INTRAMUSCULAR | Status: AC
  Administered 2022-01-11: 2 mg via INTRAVENOUS
  Filled 2022-01-11: qty 1

## 2022-01-11 MED ORDER — ONDANSETRON 4 MG PO TBDP: 4.0000 mg | ORAL_TABLET | Freq: Three times a day (TID) | ORAL | 0 refills | Status: DC | PRN

## 2022-01-11 MED ORDER — ONDANSETRON 4 MG PO TBDP
4.0000 mg | ORAL_TABLET | Freq: Once | ORAL | Status: AC
  Administered 2022-01-11: 4 mg via ORAL
  Filled 2022-01-11: qty 1

## 2022-01-11 MED ORDER — DICYCLOMINE HCL 20 MG PO TABS: 20.0000 mg | ORAL_TABLET | Freq: Two times a day (BID) | ORAL | 0 refills | Status: DC

## 2022-01-11 MED ORDER — DICYCLOMINE HCL 10 MG PO CAPS
10.0000 mg | ORAL_CAPSULE | Freq: Once | ORAL | Status: AC
  Administered 2022-01-11: 10 mg via ORAL
  Filled 2022-01-11: qty 1

## 2022-01-11 MED ORDER — LOSARTAN POTASSIUM 50 MG PO TABS
50.0000 mg | ORAL_TABLET | Freq: Once | ORAL | Status: AC
  Administered 2022-01-11: 50 mg via ORAL
  Filled 2022-01-11: qty 1

## 2022-01-11 MED ORDER — DIPHENHYDRAMINE HCL 50 MG/ML IJ SOLN
25.0000 mg | Freq: Once | INTRAMUSCULAR | Status: AC
  Administered 2022-01-11: 25 mg via INTRAVENOUS
  Filled 2022-01-11: qty 1

## 2022-01-11 MED ORDER — LABETALOL HCL 5 MG/ML IV SOLN
10.0000 mg | Freq: Once | INTRAVENOUS | Status: AC
  Administered 2022-01-11: 10 mg via INTRAVENOUS
  Filled 2022-01-11: qty 4

## 2022-01-11 NOTE — ED Notes (Signed)
ED Provider at bedside. 

## 2022-01-11 NOTE — ED Notes (Addendum)
Patient took herself off the monitor, got off the bed and ambulated well to the restroom by herself. RN gave patient a urine cup to urinate in. Patient was not able to provide a urine sampe. EDP aware

## 2022-01-11 NOTE — Discharge Instructions (Addendum)
It was a pleasure taking care of you today.  As discussed, all of your labs are reassuring.  Your CT scan showed a 5 cm ovarian cyst which was confirmed with the ultrasound.  Your ultrasound also showed a mass in your uterus.  Please follow-up with OB/GYN for further evaluation.  I have included their number on your discharge paperwork.  Blood pressure was elevated while you are here in the ER.  Continue taking your blood pressure medications as previously prescribed.  Have your BP rechecked within the next few days by PCP.  I am sending you home with nausea medication and medication for your abdominal pain.  Take as needed.  Return to the ER for new or worsening symptoms.

## 2022-01-11 NOTE — ED Notes (Signed)
Patient transported to Ultrasound 

## 2022-01-11 NOTE — ED Notes (Signed)
Patient denies pain and is resting comfortably. Reports being extremely tired. Denies any needs or concerns at this time.

## 2022-01-11 NOTE — ED Notes (Addendum)
RN wakes patient up to request a urine sample for UA. Patient reports urinating at ultrasound also states, "she'll pee when she has to at her time". Patient made aware of urine sample needed since 10a.   Patient also requested something for pain and then drifting right back to sleep snoring loudly.  EDP made aware of delay and needs of patient.

## 2022-01-11 NOTE — ED Provider Triage Note (Signed)
Emergency Medicine Provider Triage Evaluation Note  Alyssa Rangel , a 57 y.o. female  was evaluated in triage.  Pt complains of abdominal pain.  Patient was found to be unresponsive in the parking lot of the ED.  Was brought in to triage, but regained full consciousness on her own and without intervention.  Denies prodromal symptoms.  Was recently admitted for influenza, hypoxia and abdominal pain.  States her abdominal pain resolved prior to discharge.  Came back this morning.  Pain is generalized in the abdomen.  She rates it as severe.  Denies chest pain or shortness of breath  Review of Systems  Positive: As above  Negative: As above  Physical Exam  BP (!) 134/112 (BP Location: Right Arm)   Pulse 80   Temp 98.6 F (37 C) (Oral)   Resp 20   SpO2 100%  Gen:   Awake, actively vomiting Resp:  Normal effort  MSK:   Moves extremities without difficulty  Other:  Abdomen diffusely tender to palpation  Medical Decision Making  Medically screening exam initiated at 10:41 AM.  Appropriate orders placed.  Alyssa Rangel was informed that the remainder of the evaluation will be completed by another provider, this initial triage assessment does not replace that evaluation, and the importance of remaining in the ED until their evaluation is complete.  Abdominal pain labs, CT head due to syncopal episode   Roylene Reason, Hershal Coria 01/11/22 1045

## 2022-01-11 NOTE — ED Provider Notes (Signed)
[facility] EMERGENCY DEPARTMENT Provider Note   CSN: [id number] Arrival date & time: 01/11/22  1018     History  Chief Complaint  Patient presents with   Abdominal Pain    Alyssa Rangel is a 57 y.o. female with a past medical history significant for asthma, hyperlipidemia, type 2 diabetes who presents to the ED due to left upper quadrant abdominal pain that has been present for numerous weeks.  Patient states pain worsened over the past few days.  Recently admitted to the hospital on 12/28 to 1/1 for left upper quadrant abdominal pain where patient was found to have have influenza A. She also had known ovarian dermoid. She was also treated for an asthma exacerbation.  Denies diarrhea, but admits to some NBNB emesis. Admits to marijuana use. She admits to feeling weak all over.  No unilateral weakness.  Denies visual changes or speech changes.  Mother at bedside states patient has been unable to fill her prescriptions from her previous admission, so has not been taking her BP medications. Per triage and mother, patient had 2 "unresponsive" episodes in the lobby.  No urinary incontinence or mouth trauma.  No history of seizures. Denies drug and alcohol use.   History obtained from patient and past medical records. No interpreter used during encounter.       Home Medications Prior to Admission medications   Medication Sig Start Date End Date Taking? Authorizing Provider  dicyclomine (BENTYL) 20 MG tablet Take 1 tablet (20 mg total) by mouth 2 (two) times daily. 01/11/22  Yes Tekeyah Santiago C, PA-C  ondansetron (ZOFRAN-ODT) 4 MG disintegrating tablet Take 1 tablet (4 mg total) by mouth every 8 (eight) hours as needed for nausea or vomiting. 01/11/22  Yes Reaghan Kawa, Druscilla Brownie, PA-C  albuterol (VENTOLIN HFA) 108 (90 Base) MCG/ACT inhaler Inhale 2 puffs into the lungs every 6 (six) hours as needed for wheezing or shortness of breath. 06/21/20   Martyn Malay, MD   amLODipine (NORVASC) 10 MG tablet Take 1 tablet (10 mg total) by mouth daily. 01/10/22 02/09/22  Donne Hazel, MD  benzonatate (TESSALON) 200 MG capsule Take 1 capsule (200 mg total) by mouth 3 (three) times daily as needed for cough. 01/09/22   Donne Hazel, MD  Cholecalciferol (VITAMIN D3) 1000 units CAPS Take 1,000 Units by mouth daily.    [provider]  Dulaglutide (TRULICITY) 3.41 DQ/2.2WL SOPN Inject 0.75 mg into the skin once a week. Patient not taking: Reported on 01/05/2022 11/11/20   Zenia Resides, MD  fluticasone [facility]) 50 MCG/ACT nasal spray Place 2 sprays into both nostrils daily. Patient not taking: Reported on 01/05/2022 03/18/20   Martyn Malay, MD  ibuprofen (ADVIL) 200 MG tablet Take 200-600 mg by mouth every 6 (six) hours as needed for mild pain or headache.    [provider]  Insulin Pen Needle (BD PEN NEEDLE MICRO U/F) 32G X 6 MM MISC Use once weekly with ozempic 08/30/20   Martyn Malay, MD  losartan (COZAAR) 50 MG tablet Take 1 tablet (50 mg total) by mouth daily. 01/10/22 02/09/22  Donne Hazel, MD  metFORMIN (GLUCOPHAGE-XR) 500 MG 24 hr tablet Take 1 tablet (500 mg total) by mouth daily with breakfast. 01/27/20   Martyn Malay, MD  predniSONE (DELTASONE) 10 MG tablet Take 4 tablets (40 mg total) by mouth daily with breakfast for 2 days, THEN 2 tablets (20 mg total) daily with breakfast for 2 days,  THEN 1 tablet (10 mg total) daily with breakfast for 2 days, THEN 0.5 tablets (5 mg total) daily with breakfast for 2 days. 01/09/22 01/17/22  Donne Hazel, MD  rosuvastatin (CRESTOR) 20 MG tablet Take 1 tablet (20 mg total) by mouth daily. 06/23/20   Martyn Malay, MD  vitamin C (ASCORBIC ACID) 500 MG tablet Take 500 mg by mouth daily.    [provider]      Allergies    Patient has no known allergies.    Review of Systems   Review of Systems  Constitutional:  Negative for chills and fever.  Respiratory:  Negative for shortness of  breath.   Cardiovascular:  Negative for chest pain.  Gastrointestinal:  Positive for abdominal pain, nausea and vomiting. Negative for diarrhea.  Genitourinary:  Negative for dysuria.  All other systems reviewed and are negative.   Physical Exam Updated Vital Signs BP (!) 163/55   Pulse 80   Temp 98.5 F (36.9 C) (Oral)   Resp 18   SpO2 99%  Physical Exam Vitals and nursing note reviewed.  Constitutional:      General: She is not in acute distress.    Appearance: She is not ill-appearing.  HENT:     Head: Normocephalic.  Eyes:     Pupils: Pupils are equal, round, and reactive to light.  Cardiovascular:     Rate and Rhythm: Normal rate and regular rhythm.     Pulses: Normal pulses.     Heart sounds: Normal heart sounds. No murmur heard.    No friction rub. No gallop.  Pulmonary:     Effort: Pulmonary effort is normal.     Breath sounds: Normal breath sounds.  Abdominal:     General: Abdomen is flat. There is no distension.     Palpations: Abdomen is soft.     Tenderness: There is abdominal tenderness. There is no guarding or rebound.     Comments: LUQ tenderness  Musculoskeletal:        General: Normal range of motion.     Cervical back: Neck supple.  Skin:    General: Skin is warm and dry.  Neurological:     General: No focal deficit present.     Mental Status: She is alert.     Comments: Speech is clear, able to follow commands CN III-XII intact Normal strength in upper and lower extremities bilaterally including dorsiflexion and plantar flexion, strong and equal grip strength Sensation grossly intact throughout Moves extremities without ataxia, coordination intact No pronator drift   Psychiatric:        Mood and Affect: Mood normal.        Behavior: Behavior normal.     ED Results / Procedures / Treatments   Labs (all labs ordered are listed, but only abnormal results are displayed) Labs Reviewed  CBC WITH DIFFERENTIAL/PLATELET - Abnormal; Notable for  the following components:      Result Value   WBC 15.3 (*)    RBC 5.20 (*)    Platelets 413 (*)    Neutro Abs 10.2 (*)    Abs Immature Granulocytes 0.08 (*)    All other components within normal limits  COMPREHENSIVE METABOLIC PANEL - Abnormal; Notable for the following components:   Sodium 134 (*)    Glucose, Bld 158 (*)    Calcium 8.7 (*)    All other components within normal limits  CBG MONITORING, ED - Abnormal; Notable for the following components:   Glucose-Capillary  126 (*)    All other components within normal limits  I-STAT CHEM 8, ED - Abnormal; Notable for the following components:   Glucose, Bld 159 (*)    Calcium, Ion 1.04 (*)    Hemoglobin 15.3 (*)    All other components within normal limits  LIPASE, BLOOD  MAGNESIUM  URINALYSIS, ROUTINE W REFLEX MICROSCOPIC  I-STAT BETA HCG BLOOD, ED ([facility], [facility], [facility] ONLY)    EKG EKG Interpretation  Date/Time:  Wednesday January 11 2022 10:35:29 EST Ventricular Rate:  83 PR Interval:  130 QRS Duration: 70 QT Interval:  394 QTC Calculation: 462 R Axis:   -30 Text Interpretation: Normal sinus rhythm Left axis deviation Cannot rule out Anterior infarct , age undetermined Abnormal ECG When compared with ECG of 05-Jan-2022 13:48, PREVIOUS ECG IS PRESENT Confirmed by Tretha Sciara [phone]) on 01/11/2022 6:26:24 PM  Radiology US Pelvis Complete  Result Date: 01/11/2022 CLINICAL DATA:  Left pelvic pain EXAM: TRANSABDOMINAL AND TRANSVAGINAL ULTRASOUND OF PELVIS DOPPLER ULTRASOUND OF OVARIES TECHNIQUE: Both transabdominal and transvaginal ultrasound examinations of the pelvis were performed. Transabdominal technique was performed for global imaging of the pelvis including uterus, ovaries, adnexal regions, and pelvic cul-de-sac. It was necessary to proceed with endovaginal exam following the transabdominal exam to visualize the uterus and adnexa. Color and duplex Doppler ultrasound was utilized to evaluate blood flow to the ovaries.  COMPARISON:  CT 01/11/2022 FINDINGS: Uterus Measurements: 11.7 x 6.2 x 6.5 cm = volume: 247 mL. Dominant central uterine mass measuring 5.4 x 4.9 x 4.8 cm Endometrium Obscured by uterine mass Right ovary Status post oophorectomy Left ovary Measurements: 5 x 3.8 x 4.3 cm = volume: 43.4 mL. Left ovary is mostly replaced by complex cysts, corresponding to the dermoid demonstrated on CT. Flow present within the ovarian tissue at the periphery of the lesion. Pulsed Doppler evaluation of both ovaries demonstrates normal low-resistance arterial and venous waveforms. Other findings No abnormal free fluid. IMPRESSION: 1. 5 cm complex left ovarian mass, corresponds to CT demonstrated dermoid. No definite evidence for torsion. 2. Central uterine mass measuring 5.4 cm indeterminate for submucosal fibroid or endometrial mass. A focal endometrial lesion is suspected. Consider sonohysterogram for further evaluation, prior to hysteroscopy or endometrial biopsy. 3. Status post right oophorectomy Electronically Signed   By: Donavan Foil M.D.   On: 01/11/2022 20:09   US Transvaginal Non-OB  Result Date: 01/11/2022 CLINICAL DATA:  Left pelvic pain EXAM: TRANSABDOMINAL AND TRANSVAGINAL ULTRASOUND OF PELVIS DOPPLER ULTRASOUND OF OVARIES TECHNIQUE: Both transabdominal and transvaginal ultrasound examinations of the pelvis were performed. Transabdominal technique was performed for global imaging of the pelvis including uterus, ovaries, adnexal regions, and pelvic cul-de-sac. It was necessary to proceed with endovaginal exam following the transabdominal exam to visualize the uterus and adnexa. Color and duplex Doppler ultrasound was utilized to evaluate blood flow to the ovaries. COMPARISON:  CT 01/11/2022 FINDINGS: Uterus Measurements: 11.7 x 6.2 x 6.5 cm = volume: 247 mL. Dominant central uterine mass measuring 5.4 x 4.9 x 4.8 cm Endometrium Obscured by uterine mass Right ovary Status post oophorectomy Left ovary Measurements: 5 x 3.8  x 4.3 cm = volume: 43.4 mL. Left ovary is mostly replaced by complex cysts, corresponding to the dermoid demonstrated on CT. Flow present within the ovarian tissue at the periphery of the lesion. Pulsed Doppler evaluation of both ovaries demonstrates normal low-resistance arterial and venous waveforms. Other findings No abnormal free fluid. IMPRESSION: 1. 5 cm complex left ovarian mass, corresponds to CT demonstrated  dermoid. No definite evidence for torsion. 2. Central uterine mass measuring 5.4 cm indeterminate for submucosal fibroid or endometrial mass. A focal endometrial lesion is suspected. Consider sonohysterogram for further evaluation, prior to hysteroscopy or endometrial biopsy. 3. Status post right oophorectomy Electronically Signed   By: Donavan Foil M.D.   On: 01/11/2022 20:09   [place] Art/Ven Flow Abd Pelv Doppler  Result Date: 01/11/2022 CLINICAL DATA:  Left pelvic pain EXAM: TRANSABDOMINAL AND TRANSVAGINAL ULTRASOUND OF PELVIS DOPPLER ULTRASOUND OF OVARIES TECHNIQUE: Both transabdominal and transvaginal ultrasound examinations of the pelvis were performed. Transabdominal technique was performed for global imaging of the pelvis including uterus, ovaries, adnexal regions, and pelvic cul-de-sac. It was necessary to proceed with endovaginal exam following the transabdominal exam to visualize the uterus and adnexa. Color and duplex Doppler ultrasound was utilized to evaluate blood flow to the ovaries. COMPARISON:  CT 01/11/2022 FINDINGS: Uterus Measurements: 11.7 x 6.2 x 6.5 cm = volume: 247 mL. Dominant central uterine mass measuring 5.4 x 4.9 x 4.8 cm Endometrium Obscured by uterine mass Right ovary Status post oophorectomy Left ovary Measurements: 5 x 3.8 x 4.3 cm = volume: 43.4 mL. Left ovary is mostly replaced by complex cysts, corresponding to the dermoid demonstrated on CT. Flow present within the ovarian tissue at the periphery of the lesion. Pulsed Doppler evaluation of both ovaries demonstrates  normal low-resistance arterial and venous waveforms. Other findings No abnormal free fluid. IMPRESSION: 1. 5 cm complex left ovarian mass, corresponds to CT demonstrated dermoid. No definite evidence for torsion. 2. Central uterine mass measuring 5.4 cm indeterminate for submucosal fibroid or endometrial mass. A focal endometrial lesion is suspected. Consider sonohysterogram for further evaluation, prior to hysteroscopy or endometrial biopsy. 3. Status post right oophorectomy Electronically Signed   By: Donavan Foil M.D.   On: 01/11/2022 20:09   CT Abdomen Pelvis W Contrast  Result Date: 01/11/2022 CLINICAL DATA:  Acute generalized abdominal pain. EXAM: CT ABDOMEN AND PELVIS WITH CONTRAST TECHNIQUE: Multidetector CT imaging of the abdomen and pelvis was performed using the standard protocol following bolus administration of intravenous contrast. RADIATION DOSE REDUCTION: This exam was performed according to the departmental dose-optimization program which includes automated exposure control, adjustment of the mA and/or kV according to patient size and/or use of iterative reconstruction technique. CONTRAST:  10m OMNIPAQUE IOHEXOL 350 MG/ML SOLN COMPARISON:  January 05, 2022. FINDINGS: Lower chest: Mild bibasilar subsegmental atelectasis is noted. Hepatobiliary: No focal liver abnormality is seen. Status post cholecystectomy. No biliary dilatation. Pancreas: Unremarkable. No pancreatic ductal dilatation or surrounding inflammatory changes. Spleen: Normal in size without focal abnormality. Adrenals/Urinary Tract: Adrenal glands are unremarkable. Kidneys are normal, without renal calculi, focal lesion, or hydronephrosis. Bladder is unremarkable. Stomach/Bowel: Stomach is within normal limits. Appendix appears normal. No evidence of bowel wall thickening, distention, or inflammatory changes. Vascular/Lymphatic: No significant vascular findings are present. No enlarged abdominal or pelvic lymph nodes. Reproductive:  Uterus and right adnexal regions are unremarkable. Stable probable left ovarian 5 cm dermoid. Other: No abdominal wall hernia or abnormality. No abdominopelvic ascites. Musculoskeletal: Status post right total hip arthroplasty. No acute osseous abnormality is noted. IMPRESSION: Mild bibasilar subsegmental atelectasis. Stable 5 cm probable left ovarian dermoid. No acute abnormality seen in the abdomen or pelvis. Electronically Signed   By: JMarijo ConceptionM.D.   On: 01/11/2022 13:01   CT Head Wo Contrast  Result Date: 01/11/2022 CLINICAL DATA:  Mental status change of unknown cause. Abdominal pain. EXAM: CT HEAD WITHOUT CONTRAST TECHNIQUE: Contiguous  axial images were obtained from the base of the skull through the vertex without intravenous contrast. RADIATION DOSE REDUCTION: This exam was performed according to the departmental dose-optimization program which includes automated exposure control, adjustment of the mA and/or kV according to patient size and/or use of iterative reconstruction technique. COMPARISON:  None Available. FINDINGS: Brain: The brain shows a normal appearance without evidence of malformation, atrophy, old or acute small or large vessel infarction, mass lesion, hemorrhage, hydrocephalus or extra-axial collection. Vascular: No hyperdense vessel. No evidence of atherosclerotic calcification. Skull: Normal.  No traumatic finding.  No focal bone lesion. Sinuses/Orbits: Sinuses are clear. Orbits appear normal. Mastoids are clear. Other: None significant IMPRESSION: Normal head CT. Electronically Signed   By: Nelson Chimes M.D.   On: 01/11/2022 12:46    Procedures Procedures    Medications Ordered in ED Medications  dicyclomine (BENTYL) capsule 10 mg (has no administration in time range)  ondansetron (ZOFRAN-ODT) disintegrating tablet 4 mg (4 mg Oral Given 01/11/22 1100)  iohexol (OMNIPAQUE) 350 MG/ML injection 75 mL (75 mLs Intravenous Contrast Given 01/11/22 1241)  haloperidol lactate  (HALDOL) injection 2 mg (2 mg Intravenous Given 01/11/22 1843)  diphenhydrAMINE (BENADRYL) injection 25 mg (25 mg Intravenous Given 01/11/22 1843)  amLODipine (NORVASC) tablet 10 mg (10 mg Oral Given 01/11/22 1845)  losartan (COZAAR) tablet 50 mg (50 mg Oral Given 01/11/22 1845)  labetalol (NORMODYNE) injection 10 mg (10 mg Intravenous Given 01/11/22 2050)    ED Course/ Medical Decision Making/ A&P Clinical Course as of 01/11/22 2225  Wed Jan 11, 2022  1809 WBC(!): 15.3 [CA]  1809 I-stat hCG, quantitative: <5.0 [CA]  1809 Pregnancy test negative. Doubt ectopic  [CA]  1812 BP(!): 209/171 [CA]  1826 BP(!): 160/94 Rechecked BP at bedside 160/94.  [CA]  [id number] Evaluated personally at bedside.  57 year old female presenting with a chief complaint of left lower quadrant abdominal pain to left upper quadrant abdominal pain.  Has had severe abdominal pain for an extended amount of time.  Just underwent hospital admission for similar. Objective evaluation grossly reassuring at this time.  She has a stable left-sided dermoid cyst.  Torsion is considered though considered less likely due to persistent nature of symptoms going on 10 days. Location also atypical with left upper quadrant worse in left lower quadrant. CT scan without diverticulitis or any other acute pathology.  Objective evaluation grossly reassuring.  White blood cell elevation more likely to be reactive given her nausea vomiting.  States she has not been taking any of her medicines since her recent discharge.  Grossly hypertensive in emergency lobby brought back for episode of confusion.  Confusion immediately resolved during my evaluation and patient back at her mental status baseline. Do not favor seizure or posterior reversible encephalopathy given no postictal period and rapid resolution of symptoms without any intervention. Will treat her acutely in the emergency department with antiemetics, symptom control, blood pressure management p.o and  reassessment of symptoms.  Will evaluate for torsion with pelvic ultrasound given her ongoing pain and presence of a cyst. If negative, favor that patient will be stable for outpatient care management with supportive care..  [CC]  2029 Reassessed patient at bedside. Patient admits to some improvement in pain. BP trending up. Will give IV BP medication and reassess.  [CA]    Clinical Course User Index [CA] Suzy Bouchard, PA-C [CC] Tretha Sciara, MD  Medical Decision Making Amount and/or Complexity of Data Reviewed Independent Historian: parent    Details: Mother at bedside provided history External Data Reviewed: notes.    Details: Previous admission notes Labs: ordered. Decision-making details documented in ED Course. Radiology: ordered and independent interpretation performed. Decision-making details documented in ED Course. ECG/medicine tests: ordered and independent interpretation performed. Decision-making details documented in ED Course.  Risk Prescription drug management.   This patient presents to the ED for concern of LUQ abdominal pain, this involves an extensive number of treatment options, and is a complaint that carries with it a high risk of complications and morbidity.  The differential diagnosis includes diverticulitis, bowel obstruction, perforation, dissection, pancreatitis, etc  57 year old female presents to the ED due to left upper quadrant abdominal pain that has been present for numerous weeks however worsened over the past few days.  Recently admitted to hospital on 12/28 to 1/1 due to abdominal pain, influenza A, and asthma exacerbation.  No urinary or vaginal symptoms.  Denies diarrhea; however admits to vomiting. Admits to marijuana use. Mother at bedside states patient had 2 "unresponsive" episodes in the waiting room.  While patient was in the waiting room BP was elevated 200s/100s.  Mother notes that patient has not been taking  her BP medications.  Upon rechecked during initial evaluation which was unfortunately over 8 hours since presentation due to long wait times, BP 160/94.  Nonfocal neurological exam. Low suspicion for CVA. Tenderness to left upper quadrant.  Abdominal labs, CT head, CT abdomen ordered at triage.  IV Haldol and Benadryl given.  Patient discussed with Dr. Oswald Hillock who evaluated patient at bedside who agrees with assessment and plan.  Will add pelvic ultrasound to rule out ovarian torsion given large dermoid cyst and abdominal pain. Possible cannabinoid hyperemesis syndrome? Low suspicion for dissection.  CBC significant for mild leukocytosis at 15.3, likely reactive.  Normal hemoglobin.  CMP significant for hyperglycemia at 158.  No anion gap.  Doubt DKA.  Normal renal function.  Normal LFTs.  Lipase normal.  Doubt pancreatitis.  CT abdomen personally reviewed and interpreted which demonstrates bibasilar atelectasis.  Also shows a stable 5 cm probable left ovarian dermoid.  No other acute abnormalities.  No evidence of bowel obstruction, pancreatitis, acute cholecystitis, or other emergent etiologies of abdominal pain.   US shows 5cm complex left ovarian mass.  No evidence of torsion.  Does also demonstrate central uterine mass measuring 5.4 cm.  Advised patient of findings and advised patient follow-up with OB/GYN for further evaluation.  Number given to patient at discharge.  10:12 PM reassessed patient at bedside.  Patient admits to some improvement in abdominal pain.  Patient just went to the restroom and was unable to give urine sample because she "had to go super bad" and forgot urine cup.  Patient denies any dysuria.  Lower suspicion for UTI as etiology of pain.  Unknown etiology of abdominal pain.  While patient was on cardiac monitoring for over 4 hours, patient had no "unresponsive" episodes.  No evidence of arrhythmias.  Low suspicion for cardiac etiology of episodes. No seizure-like activity  while  in a room in the ED.  Lower suspicion for seizures.  Patient's BP improved after labetalol and her home medications.  Instructed patient to take BP medications as previously prescribed and follow-up with PCP for BP recheck within the next few days.  Neurological exam nonfocal.  Patient able to ambulate in the ED without difficulty.  Low suspicion for CVA or PRES.  Strict ED precautions discussed with patient. Patient states understanding and agrees to plan. Patient discharged home in no acute distress and stable vitals  Has PCP Hx HTN, DM        Final Clinical Impression(s) / ED Diagnoses Final diagnoses:  Left upper quadrant abdominal pain  Nausea and vomiting, unspecified vomiting type  Elevated blood pressure reading  Uterine mass    Rx / DC Orders ED Discharge Orders          Ordered    ondansetron (ZOFRAN-ODT) 4 MG disintegrating tablet  Every 8 hours PRN        01/11/22 2214    dicyclomine (BENTYL) 20 MG tablet  2 times daily        01/11/22 2214              Karie Kirks 01/11/22 2226    Tretha Sciara, MD 01/16/22 1535

## 2022-01-11 NOTE — ED Triage Notes (Signed)
Pt wheeled to triage by security. Pt was brought in by her uncle and "passed out" when getting out of the car. Upon arrival to triage room pt became alert and was oriented x 4.

## 2022-01-13 ENCOUNTER — Encounter: Payer: Self-pay | Admitting: Family Medicine

## 2022-01-13 ENCOUNTER — Ambulatory Visit (INDEPENDENT_AMBULATORY_CARE_PROVIDER_SITE_OTHER): Payer: No Typology Code available for payment source | Admitting: Family Medicine

## 2022-01-13 ENCOUNTER — Other Ambulatory Visit: Payer: Self-pay

## 2022-01-13 VITALS — BP 112/58 | HR 95 | Wt 251.0 lb

## 2022-01-13 DIAGNOSIS — I10 Essential (primary) hypertension: Secondary | ICD-10-CM

## 2022-01-13 DIAGNOSIS — N838 Other noninflammatory disorders of ovary, fallopian tube and broad ligament: Secondary | ICD-10-CM

## 2022-01-13 NOTE — Patient Instructions (Addendum)
It was wonderful to see you today.  Please bring ALL of your medications with you to every visit.   Today we talked about:  Your blood pressure was improved today. Please return for a lab visit to check your electrolytes and kidney function.  Continue your medications as prescribed, no changes today. I have sent a referral to the GYN, they will contact you for an appointment.   Thank you for coming to your visit as scheduled. We have had a large "no-show" problem lately, and this significantly limits our ability to see and care for patients. As a friendly reminder- if you cannot make your appointment please call to cancel. We do have a no show policy for those who do not cancel within 24 hours. Our policy is that if you miss or fail to cancel an appointment within 24 hours, 3 times in a 28-monthperiod, you may be dismissed from our clinic.   Thank you for choosing [facility]   Please call [phone] any questions about today's appointment.  Please be sure to schedule follow up at the front  desk before you leave today.   ASharion Settler DO PGY-3 Family Medicine

## 2022-01-13 NOTE — Progress Notes (Signed)
    SUBJECTIVE:   CHIEF COMPLAINT / HPI:   Alyssa Rangel is a 57 y.o. female who presents to the [facility] clinic today to discuss the following concerns:   ED follow up  Patient was seen in the ED on 1/3 for left upper quadrant abdominal pain present for several weeks. Prior to this ED visit she was admitted from 12/28 to 1/1 for similar LUQ abdominal pain, also found to have influenza A.  She has a previously known ovarian dermoid cyst.  In the ED her labs were notable for WBC 15; elevation thought to be reactive given N/V.  She was given Bentyl, Zofran, Benadryl.  CT scan was negative for any acute pathology. She had a pelvic U/S which showed 5 cm complex left ovarian mass without evidence of torsion and a central uterine mass measuring 5.4 cm. She was advised to f/u with OBGYN. In the ED she also had reported episodes of confusion (twice in lobby). These self-resolved. Her BP was elevated and she was reportedly unable to get her BP medications after her most recent hospitalization. She was given both PO and IV antihypertensives with improvement by ED discharge.  She presents today to follow up on her blood pressure. She is prescribed amlodipine 10 mg, losartan 50 mg for her BP.  She has been taking her medications, started yesterday.   She reports this was her first day back to work, she has felt drained and without energy but not passing out/confused. She works as a Quarry manager. She specifically denies lightheadedness, dizziness.   PERTINENT  PMH / PSH: Hypertension, hypercholesterolemia, OA, type 2 diabetes (last A1c 7.3 in 12/23), obesity   OBJECTIVE:   BP (!) 112/58 (BP Location: Left Arm, Patient Position: Sitting)   Pulse 95   Wt 251 lb (113.9 kg)   SpO2 99%   BMI 43.08 kg/m   Vitals:   01/13/22 1452 01/13/22 1507  BP: (!) 133/119 (!) 112/58  Pulse: 95   SpO2: 99%    General: NAD, pleasant, able to participate in exam Cardiac: RRR, no murmurs. Respiratory: CTAB, normal effort, No  wheezes, rales or rhonchi Extremities: no edema Skin: warm and dry Psych: Normal affect and mood  ASSESSMENT/PLAN:   1. Ovarian mass Have placed referral to f/u with GYN for ovarian mass and uterine mass visualized on recent pelvic U/S in ED.  - Ambulatory referral to Gynecology  2. Primary hypertension Initially hypertensive but improved on recheck. Adherent to her anti-hypertensive's but just started them yesterday. Have scheduled for future lab appointment in a couple weeks to check electrolytes/renal function given ARB use. She has not had any further episodes of confusion/syncope since ED discharge. Appears well and appropriate today. - Basic Metabolic Panel; Future   Sharion Settler, [place]

## 2022-01-26 ENCOUNTER — Other Ambulatory Visit: Payer: No Typology Code available for payment source

## 2022-01-26 DIAGNOSIS — I10 Essential (primary) hypertension: Secondary | ICD-10-CM

## 2022-01-27 LAB — BASIC METABOLIC PANEL
BUN/Creatinine Ratio: 11 (ref 9–23)
BUN: 8 mg/dL (ref 6–24)
CO2: 25 mmol/L (ref 20–29)
Calcium: 9.3 mg/dL (ref 8.7–10.2)
Chloride: 99 mmol/L (ref 96–106)
Creatinine, Ser: 0.72 mg/dL (ref 0.57–1.00)
Glucose: 113 mg/dL — ABNORMAL HIGH (ref 70–99)
Potassium: 5 mmol/L (ref 3.5–5.2)
Sodium: 142 mmol/L (ref 134–144)
eGFR: 97 mL/min/{1.73_m2} (ref >59)

## 2022-02-23 ENCOUNTER — Other Ambulatory Visit: Payer: Self-pay | Admitting: *Deleted

## 2022-02-23 ENCOUNTER — Other Ambulatory Visit (HOSPITAL_COMMUNITY)
Admission: RE | Admit: 2022-02-23 | Discharge: 2022-02-23 | Disposition: A | Discharge status: Home / Self Care | Payer: No Typology Code available for payment source | Source: Ambulatory Visit | Attending: Obstetrics and Gynecology | Admitting: Obstetrics and Gynecology

## 2022-02-23 ENCOUNTER — Encounter: Payer: Self-pay | Admitting: Obstetrics and Gynecology

## 2022-02-23 ENCOUNTER — Ambulatory Visit: Payer: No Typology Code available for payment source | Admitting: Obstetrics and Gynecology

## 2022-02-23 VITALS — BP 130/64 | HR 72 | Ht 64.0 in | Wt 255.0 lb

## 2022-02-23 DIAGNOSIS — R1012 Left upper quadrant pain: Secondary | ICD-10-CM

## 2022-02-23 DIAGNOSIS — Z01419 Encounter for gynecological examination (general) (routine) without abnormal findings: Secondary | ICD-10-CM | POA: Diagnosis not present

## 2022-02-23 DIAGNOSIS — N760 Acute vaginitis: Secondary | ICD-10-CM | POA: Diagnosis not present

## 2022-02-23 DIAGNOSIS — Z124 Encounter for screening for malignant neoplasm of cervix: Secondary | ICD-10-CM | POA: Insufficient documentation

## 2022-02-23 DIAGNOSIS — N858 Other specified noninflammatory disorders of uterus: Secondary | ICD-10-CM | POA: Diagnosis not present

## 2022-02-23 DIAGNOSIS — N83202 Unspecified ovarian cyst, left side: Secondary | ICD-10-CM

## 2022-02-23 LAB — WET PREP FOR TRICH, YEAST, CLUE

## 2022-02-23 MED ORDER — METRONIDAZOLE 500 MG PO TABS: 500.0000 mg | ORAL_TABLET | Freq: Two times a day (BID) | ORAL | 0 refills | Status: DC

## 2022-02-23 MED ORDER — CLOBETASOL PROPIONATE 0.05 % EX OINT: 1.0000 | TOPICAL_OINTMENT | Freq: Two times a day (BID) | CUTANEOUS | 0 refills | Status: DC

## 2022-02-23 NOTE — Patient Instructions (Signed)

## 2022-02-23 NOTE — Progress Notes (Signed)
57 y.o. G0P0 Single Black or African American Not Hispanic or Latino female here for f/u from the ER, she presented with LUQ abdominal pain and nausea. She was seen in the ER on 01/11/22. In addition to the LUQ abdominal pain she has been having some intermittent LLQ abdominal pain.  Overdue for an annual exam.  U/S showed a 5 cm complex ovarian mass, cw a dermoid on the left. No signs of torsion.  She also had a 5.4 cm central uterine mass indeterminate for a submucosal fibroid or endometrial mass. Sonohysterogram recommended.  I reviewed the images with the patient, the uterine mass has the appearance of a fibroid.   The patient has a h/o laparoscopic bilateral ovarian cystectomies of dermoids, hysteroscopy, D&C and endometrial ablation in 1/14.   No vaginal bleeding. Sexually active, no STD concerns.  She does c/o vulvar pruritus. No other vaginal c/o.   No LMP recorded. Patient has had an ablation.          Sexually active: Yes.    The current method of family planning is none.    Exercising: No.  The patient does not participate in regular exercise at present. Smoker:  no  Health Maintenance: Pap:  06/10/16 WNL Hr HPV neg  History of abnormal Pap:  no MMG:  06/05/19 density B Bi-rads 1 neg  BMD:   none  Colonoscopy: 04/28/20 normal  TDaP:  07/17/12  Gardasil: none    reports that she quit smoking about 10 years ago. Her smoking use included cigarettes. She has a 25.00 pack-year smoking history. She has never used smokeless tobacco. She reports current alcohol use of about 2.0 standard drinks of alcohol per week. 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    Type 2 diabetes mellitus ([place])    Urinary incontinence     Past Surgical History:  Procedure Laterality Date   BILATERAL SALPINGECTOMY  02/06/2012   Procedure: BILATERAL SALPINGECTOMY;  Surgeon: Delice Lesch, MD;  Location: [facility];  Service: Gynecology;  Laterality: Bilateral;  partial bilateral salpingectomy   CHOLECYSTECTOMY N/A 10/31/2012   Procedure: LAPAROSCOPIC CHOLECYSTECTOMY WITH INTRAOPERATIVE CHOLANGIOGRAM;  Surgeon: Edward Jolly, MD;  Location: [facility];  Service: General;  Laterality: N/A;   CYSTOSCOPY  02/06/2012   Procedure: CYSTOSCOPY;  Surgeon: Delice Lesch, MD;  Location: [facility];  Service: Gynecology;  Laterality: N/A;   DILITATION & CURRETTAGE/HYSTROSCOPY WITH NOVASURE ABLATION  02/06/2012   Procedure: DILATATION & CURETTAGE/HYSTEROSCOPY WITH NOVASURE ABLATION;  Surgeon: Delice Lesch, MD;  Location: [facility];  Service: Gynecology;  Laterality: N/A;   novasure   LAPAROSCOPY  02/06/2012   Procedure: LAPAROSCOPY OPERATIVE;  Surgeon: Delice Lesch, MD;  Location: [facility];  Service: Gynecology;  Laterality: N/A;   OVARIAN CYST REMOVAL  02/06/2012   Procedure: OVARIAN CYSTECTOMY;  Surgeon: Delice Lesch, MD;  Location: [facility];  Service:  Gynecology;  Laterality: Bilateral;   TOTAL HIP ARTHROPLASTY Right 10/09/2012   Procedure: RIGHT TOTAL HIP ARTHROPLASTY;  Surgeon: Gearlean Alf, MD;  Location: [facility];  Service: Orthopedics;  Laterality: Right;    Current Outpatient Medications  Medication Sig Dispense Refill   albuterol (VENTOLIN HFA) 108 (90 Base) MCG/ACT inhaler Inhale 2 puffs into the lungs every 6 (six) hours as needed for wheezing or shortness of breath. 8 g 0   Cholecalciferol (VITAMIN D3) 1000 units CAPS Take 1,000 Units by mouth daily.     ibuprofen (ADVIL) 200 MG tablet Take 200-600 mg by mouth every  6 (six) hours as needed for mild pain or headache.     metFORMIN (GLUCOPHAGE-XR) 500 MG 24 hr tablet Take 1 tablet (500 mg total) by mouth daily with breakfast. 90 tablet 3   rosuvastatin (CRESTOR) 20 MG tablet Take 1 tablet (20 mg total) by mouth daily. 90 tablet 3   vitamin C (ASCORBIC ACID) 500 MG tablet Take 500 mg by mouth daily.     amLODipine (NORVASC) 10 MG tablet Take 1 tablet (10 mg total) by mouth daily. 30 tablet 0   dicyclomine (BENTYL) 20 MG tablet Take 1 tablet (20 mg total) by mouth 2 (two) times daily. (Patient not taking: Reported on 02/23/2022) 20 tablet 0   losartan (COZAAR) 50 MG tablet Take 1 tablet (50 mg total) by mouth daily. 30 tablet 0   No current facility-administered medications for this visit.    Family History  Problem Relation Age of Onset   Diabetes Mother        insulin dependent   Hypertension Mother    Other Father        unknown   Multiple sclerosis Sister    Heart disease Maternal Grandmother    Diabetes Maternal Grandmother    Hypertension Maternal Grandmother    Arthritis Maternal Grandmother    Cancer Neg Hx    Stroke Neg Hx    Colon cancer Neg Hx    Esophageal cancer Neg Hx    Rectal cancer Neg Hx    Stomach cancer Neg Hx    Breast cancer Neg Hx    Ovarian cancer Neg Hx     Review of Systems  Gastrointestinal:  Positive for abdominal pain and nausea.  All other systems reviewed and are negative.   Exam:   BP 130/64   Pulse 72   Ht 5' 4"$  (1.626 m)   Wt 255 lb (115.7 kg)   SpO2 98%   BMI 43.77 kg/m   Weight change: @WEIGHTCHANGE$ @ Height:   Height: 5' 4"$  (162.6 cm)  Ht Readings from Last 3 Encounters:  02/23/22 5' 4"$  (1.626 m)  01/05/22 5' 4"$  (1.626 m)  08/05/20 5' 4"$  (1.626 m)    General appearance: alert, cooperative and appears stated age Head: Normocephalic, without obvious abnormality, atraumatic Neck: no adenopathy, supple, symmetrical, trachea midline and thyroid normal to inspection and palpation Lungs: clear to  auscultation bilaterally Cardiovascular: regular rate and rhythm Breasts: normal appearance, no masses or tenderness, darkening and thickening of the skin under her breasts (suspect candida, she has nystatin to use) Abdomen: soft, non-tender; non distended,  no masses,  no organomegaly Extremities: extremities normal, atraumatic, no cyanosis or edema Skin: Skin color, texture, turgor normal. No rashes or lesions Lymph nodes: Cervical, supraclavicular, and axillary nodes normal. No abnormal inguinal nodes palpated Neurologic: Grossly normal   Pelvic: External genitalia:  no lesions  Urethra:  normal appearing urethra with no masses, tenderness or lesions              Bartholins and Skenes: normal                 Vagina: normal appearing vagina with an increase in creamy, white vaginal discharge, no lesions              Cervix: no lesions               Bimanual Exam:  Uterus:   no masses or tenderness              Adnexa:  no masses, tender on the left               Rectovaginal: Confirms               Anus:  normal sphincter tone, no lesions  Gae Dry, CMA chaperoned for the exam.  1. Well woman exam Discussed breast self exam Discussed calcium and vit D intake Labs with primary Colonoscopy UTD  2. Screening for cervical cancer - Cytology - PAP  3. Left ovarian cyst C/W dermoid, will refer to GYN surgeon  4. Uterine mass By my review of the images, it looks c/w a fibroid. No vaginal bleeding. Given her history of an endometrial ablation, sonohysterogram would likely be difficult to complete. -She will f/u with Gyn surgeon for another opinion  5. LUQ abdominal pain - Ambulatory referral to Gastroenterology  6. Vulvovaginitis - WET PREP FOR TRICH, YEAST, CLUE - clobetasol ointment (TEMOVATE) 0.05 %; Apply 1 Application topically 2 (two) times daily. Apply as directed twice daily, use for up to 2 weeks as needed.  Dispense: 60 g; Refill: 0  7. BV (bacterial  vaginosis) - metroNIDAZOLE (FLAGYL) 500 MG tablet; Take 1 tablet (500 mg total) by mouth 2 (two) times daily.  Dispense: 14 tablet; Refill: 0

## 2022-02-27 LAB — CYTOLOGY - PAP
Comment: NEGATIVE
Diagnosis: UNDETERMINED — AB
High risk HPV: NEGATIVE

## 2022-04-20 ENCOUNTER — Encounter: Payer: Self-pay | Admitting: Nurse Practitioner

## 2022-04-20 ENCOUNTER — Ambulatory Visit: Payer: No Typology Code available for payment source | Admitting: Nurse Practitioner

## 2022-04-20 VITALS — BP 134/76 | HR 77 | Ht 64.0 in | Wt 254.0 lb

## 2022-04-20 DIAGNOSIS — R1310 Dysphagia, unspecified: Secondary | ICD-10-CM

## 2022-04-20 DIAGNOSIS — R1012 Left upper quadrant pain: Secondary | ICD-10-CM | POA: Diagnosis not present

## 2022-04-20 NOTE — Progress Notes (Signed)
04/20/2022 Alyssa Rangel [id number] 03/27/1965   Chief Complaint: Nausea, LUQ pain  History of Present Illness: Alyssa Rangel is a 57 year old female with a past medical history of obesity, asthma, hypercholesterolemia, hypertension, diabetes mellitus type 2, diverticulosis and colon polyps. S/P cholecystectomy, Right hip replacement, laparoscopic bilateral ovarian cystectomies of dermoids, hysteroscopy, D&C and endometrial ablation 2014.   She presents to our office today as referred by Alyssa Rangel for further evaluation regarding nausea and LUQ pain. She developed abrupt onset of nausea/vomiting and LUQ pain on 01/04/2022. Her LUQ abdominal pain became severe 12/28 therefore she contacted EMS who administered Fentanyl which resulted in hypotension and she was subsequently transported to the ED for further evaluation. She also endorsed having chest tightness with shortness of breath. Chest CTA was negative for PE but showed patchy infiltrates in both lower lungs suggestive of atelectasis versus pneumonia. She tested positive for influenza and was admitted with hypoxic respiratory failure. She required oxygen nasal cannula, steroids and Tamiflu. EKG was negative for acute ischemia and troponin levels were negative. CTAP showed a normal liver and pancreas with evidence of of diverticulosis without diverticulitis. A 4.9 cm left adnexal cystic lesion/dermoid cyst was identified. Her respiratory status stabilized and her LUQ pain resolved therefore she was discharged home on 01/09/2022 with recommendations to follow-up with gyn.  Her LUQ pain recurred and she went back to the ED 01/11/2022.  Labs in the ED showed a WBC count of 15.3.  Hemoglobin 14.3.  Glucose 158.  Normal LFTs and lipase level.  Repeat CTAP was negative for acute intra-abdominal/pelvic pathology to explain her symptoms and the left ovarian dermoid cyst was stable.  She received pain meds and antiemetics her symptoms  significantly improved then was discharged home.   She was seen by her gynecologist Alyssa Rangel 02/23/2022 who says she had a left ovarian dermoid cyst and a uterine fibroid. She was referred to GYN surgery for further evaluation/second opinion.  Alyssa Rangel also recommended GI evaluation due to the patient's unexplained LUQ pain which resulted into ED visits as noted above.  She presents to our office for further GI evaluation. She denies having any further LUQ pain since she presented back to the ED 01/11/2022. No nausea or vomiting. No heartburn. She endorses having dysphagia for the past few months. She describes forceful swallowing and food intermittently becomes stuck in the throat/upper esophagus when she feels choked and panicked.  Episodes of dysphagia occur a few times weekly, no specific food triggers. No odynophagia. She is a non-smoker. She drinks an occasional glass of wine. She denies drug use. She takes Ibuprofen 200 mg 2 tabs once or twice weekly for arthritis pain. She underwent a screening colonoscopy by Alyssa Rangel 04/2020 which identified 1 small tubular adenomatous polyp removed from the transverse colon.     Latest Ref Rng & Units 01/11/2022   10:59 AM 01/11/2022   10:50 AM 01/09/2022    8:46 AM  CBC  WBC 4.0 - 10.5 K/uL  15.3  11.0   Hemoglobin 12.0 - 15.0 g/dL 24.415.3  01.014.3  27.214.0   Hematocrit 36.0 - 46.0 % 45.0  45.2  44.7   Platelets 150 - 400 K/uL  413  353        Latest Ref Rng & Units 01/26/2022   11:34 AM 01/11/2022   10:59 AM 01/11/2022   10:50 AM  CMP  Glucose 70 - 99 mg/dL 536113  644159  034158   BUN  6 - 24 mg/dL 8  16  15    Creatinine 0.57 - 1.00 mg/dL 3.66  4.40  3.47   Sodium 134 - 144 mmol/L 142  135  134   Potassium 3.5 - 5.2 mmol/L 5.0  4.4  4.5   Chloride 96 - 106 mmol/L 99  102  99   CO2 20 - 29 mmol/L 25   22   Calcium 8.7 - 10.2 mg/dL 9.3   8.7   Total Protein 6.5 - 8.1 g/dL   7.0   Total Bilirubin 0.3 - 1.2 mg/dL   1.1   Alkaline Phos 38 - 126 U/L   54   AST 15 - 41  U/L   22   ALT 0 - 44 U/L   29   Lipase level 35 on 01/11/2022 and Lipase 29 on 01/05/2022.  CTAP with contrast 01/05/2022: COMPARISON:  11/06/2012   FINDINGS: Lower chest: There are linear patchy infiltrates in right middle lobe, lingula and both lower lobes.   Hepatobiliary: No focal abnormalities are seen in liver. Surgical clips are seen in gallbladder fossa. There is no dilation of bile ducts.   Pancreas: No focal abnormalities are seen.   Spleen: Unremarkable.   Adrenals/Urinary Tract: Adrenals are unremarkable. There is no hydronephrosis. There are no renal or ureteral stones. Urinary bladder is not distended limiting its evaluation.   Stomach/Bowel: Stomach is unremarkable. Small bowel loops are not dilated. Appendix is not dilated. Scattered diverticula are seen in colon without signs of focal diverticulitis.   Vascular/Lymphatic: Scattered arterial calcifications are seen.   Reproductive: Uterus is enlarged in size. There is 4.9 x 3.6 cm smooth marginated lesion with fat, fluid and soft tissue attenuation in the left adnexa which has not changed significantly suggesting possible ovarian dermoid.   Other: There is no ascites or pneumoperitoneum. Umbilical hernia containing fat is seen.   Musculoskeletal: Degenerative changes are noted in thoracic and lumbar spine. There is minimal anterolisthesis at L4-L5 level. There is previous right hip arthroplasty.   IMPRESSION: There is no evidence of intestinal obstruction or pneumoperitoneum. Appendix is unremarkable. There is no hydronephrosis.   There are patchy infiltrates in both lower lung fields suggesting atelectasis/pneumonia. Part of this finding may suggest underlying scarring.   Scattered diverticula are seen in colon without signs of diverticulitis.   There is 4.9 cm smooth marginated lesion with fat and fluid attenuation in left adnexa suggesting possible ovarian dermoid. Follow-up pelvic sonogram may  be considered.   Other findings as described in the body of the report.  CTAP with contrast 01/11/2022: Mild bibasilar subsegmental atelectasis.  Stable 5 cm probable left ovarian dermoid.  No acute abnormality seen in the abdomen or pelvis.  GI PROCEDURES:    Colonoscopy 04/28/2020 by Dr. Christella Hartigan: - One 2 mm polyp in the transverse colon, removed with a cold snare. Resected and retrieved.  - Diverticulosis in the left colon.  - The examination was otherwise normal on direct and retroflexion views. - Recall colonoscopy 7 years  Surgical [P], colon, transverse, polyp (1) - TUBULAR ADENOMA. - NO HIGH GRADE DYSPLASIA OR CARCINOMA.  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    Type 2 diabetes mellitus ([facility])    Urinary incontinence    Past Surgical History:  Procedure Laterality Date   BILATERAL SALPINGECTOMY  02/06/2012   Procedure: BILATERAL SALPINGECTOMY;  Surgeon: Purcell Nails, MD;  Location: [facility];  Service: Gynecology;  Laterality: Bilateral;  partial bilateral salpingectomy   CHOLECYSTECTOMY N/A 10/31/2012   Procedure: LAPAROSCOPIC CHOLECYSTECTOMY WITH INTRAOPERATIVE CHOLANGIOGRAM;  Surgeon: Mariella Saa, MD;  Location: [facility];  Service: General;  Laterality: N/A;   CYSTOSCOPY  02/06/2012   Procedure: CYSTOSCOPY;  Surgeon: Purcell Nails, MD;  Location: [facility];  Service: Gynecology;  Laterality: N/A;   DILITATION & CURRETTAGE/HYSTROSCOPY WITH NOVASURE ABLATION  02/06/2012   Procedure: DILATATION & CURETTAGE/HYSTEROSCOPY WITH NOVASURE ABLATION;  Surgeon: Purcell Nails, MD;  Location: [facility];  Service: Gynecology;  Laterality: N/A;   novasure   LAPAROSCOPY  02/06/2012   Procedure: LAPAROSCOPY OPERATIVE;  Surgeon: Purcell Nails, MD;  Location: [facility];  Service: Gynecology;  Laterality: N/A;   OVARIAN CYST REMOVAL  02/06/2012   Procedure: OVARIAN CYSTECTOMY;  Surgeon: Purcell Nails, MD;  Location: [facility];  Service: Gynecology;  Laterality: Bilateral;   TOTAL HIP ARTHROPLASTY Right 10/09/2012   Procedure: RIGHT TOTAL HIP ARTHROPLASTY;  Surgeon: Loanne Drilling, MD;  Location: [facility];  Service: Orthopedics;  Laterality: Right;   Current Outpatient Medications on File Prior to Visit  Medication Sig Dispense Refill   albuterol (VENTOLIN HFA) 108 (90 Base) MCG/ACT inhaler Inhale 2 puffs into the lungs every 6 (six) hours as needed for wheezing or shortness of breath. 8 g 0   Cholecalciferol (VITAMIN D3) 1000 units CAPS Take 1,000 Units by mouth daily.     clobetasol ointment (TEMOVATE) 0.05 % Apply 1 Application topically 2 (two) times daily. Apply as directed twice daily, use for up to 2 weeks as needed. 60 g 0   dicyclomine (BENTYL) 20 MG tablet Take 1 tablet (20 mg total) by mouth 2 (two) times daily. 20 tablet 0   ibuprofen (ADVIL) 200 MG tablet Take 200-600 mg by mouth every 6 (six) hours as needed for mild pain or headache.     metFORMIN (GLUCOPHAGE-XR) 500 MG 24 hr tablet Take 1 tablet (500 mg total) by mouth daily with breakfast. 90 tablet 3   rosuvastatin (CRESTOR) 20 MG tablet Take 1 tablet (20 mg total) by mouth daily. 90 tablet 3   vitamin C (ASCORBIC ACID) 500 MG tablet Take 500 mg by mouth daily.     amLODipine (NORVASC) 10 MG tablet Take 1 tablet (10 mg total) by mouth daily. 30 tablet 0   losartan (COZAAR) 50 MG tablet Take 1 tablet (50 mg total) by mouth daily. 30 tablet 0   No current facility-administered medications on file prior to visit.   No Known Allergies  Current Medications, Allergies, Past Medical History, Past Surgical  History, Family History and Social History were reviewed in [employer] record.  Review of Systems:   Constitutional: Negative for fever, sweats, chills or weight loss.  Respiratory: See HPI.  No current shortness of breath. Cardiovascular: See HPI. No current CP. Gastrointestinal: See HPI.  Musculoskeletal: Negative for back pain or muscle aches.  Neurological: Negative for dizziness, headaches or paresthesias.   Physical Exam: BP 134/76  Pulse 77   Ht  (1.626 m)   Wt 254 lb (115.2 kg)   SpO2 98%   BMI 43.60 kg/m   General: 57 year old female in no acute distress. Head: Normocephalic and atraumatic. Eyes: No scleral icterus. Conjunctiva pink . Ears: Normal auditory acuity. Mouth: Dentition intact. No ulcers or lesions.  No oral thrush. Lungs: Clear throughout to auscultation. Heart: Regular rate and rhythm, no murmur. Abdomen: Soft, nontender and nondistended. No masses or hepatomegaly. Normal bowel sounds x 4 quadrants.  Rectal: Deferred. Musculoskeletal: Symmetrical with no gross deformities. Extremities: No edema. Neurological: Alert oriented x 4. No focal deficits.  Psychological: Alert and cooperative. Normal mood and affect  Assessment and Recommendations:  57 year old female with abrupt onset LUQ pain 01/05/2022 in setting of influenza A required hospital admission.  CTAP 01/05/2022 findings did not explain her LUQ pain. Normal LFTs and lipase level. Patient had recurrence of LUQ pain 01/11/2022 and presented back to the ED for further evaluation. Repeat CTAP 01/11/2022 was negative for acute intra-abdominal pathology. Normal LFTs and lipase level.  Her LUQ pain abated after she received pain meds in the ED without further recurrence. -Start Prilosec 20 mg QD OTC if LUQ pain recurs -Avoid spicy/greasy foods -See plan below regarding endoscopic evaluation  Dysphagia x 2 months.  No odynophagia.  Received steroids during her recent hospital  admission with influenza/hypoxic respiratory failure. -EGD for new onset dysphagia, rule out candidiasis esophagitis/reflux esophagitis/esophageal stricture and to rule out gastritis/PUD/H. Pylori in setting of acute LUQ pain x 2 episodes. EGD benefits and risks discussed including risk with sedation, risk of bleeding, perforation and infection   History of a 2 mm tubular adenomatous polyp removed from the transverse colon per colonoscopy 04/2020 -Next colon polyp surveillance colonoscopy due 04/2027  Left dermoid cyst/uterine fibroid -Continue gyn evaluation as planned  DM II

## 2022-04-20 NOTE — Patient Instructions (Signed)
You have been scheduled for an endoscopy. Please follow written instructions given to you at your visit today. If you use inhalers (even only as needed), please bring them with you on the day of your procedure.  Contact our office if left upper abdominal pain recurs.  Due to recent changes in healthcare laws, you may see the results of your imaging and laboratory studies on MyChart before your provider has had a chance to review them.  We understand that in some cases there may be results that are confusing or concerning to you. Not all laboratory results come back in the same time frame and the provider may be waiting for multiple results in order to interpret others.  Please give [place] 48 hours in order for your provider to thoroughly review all the results before contacting the office for clarification of your results.   Thank you for trusting me with your gastrointestinal care!   Alcide Evener, CRNP

## 2022-04-24 NOTE — Progress Notes (Signed)
Addendum: Reviewed and agree with assessment and management plan. Kaimen Peine M, MD  

## 2022-05-24 ENCOUNTER — Encounter: Payer: No Typology Code available for payment source | Admitting: Obstetrics and Gynecology

## 2022-05-24 ENCOUNTER — Telehealth: Payer: Self-pay | Admitting: Obstetrics and Gynecology

## 2022-05-24 NOTE — Telephone Encounter (Signed)
Called and left patient a detailed message about appointment change, Dr. Briscoe Deutscher will not be in the office, asked her to call us back to get her rescheduled

## 2022-06-09 ENCOUNTER — Encounter: Payer: Self-pay | Admitting: Internal Medicine

## 2022-06-09 ENCOUNTER — Ambulatory Visit: Payer: No Typology Code available for payment source | Admitting: Internal Medicine

## 2022-06-09 VITALS — BP 163/83 | HR 76 | Temp 99.1°F | Resp 15 | Ht 64.0 in | Wt 254.0 lb

## 2022-06-09 DIAGNOSIS — R1310 Dysphagia, unspecified: Secondary | ICD-10-CM

## 2022-06-09 DIAGNOSIS — K259 Gastric ulcer, unspecified as acute or chronic, without hemorrhage or perforation: Secondary | ICD-10-CM

## 2022-06-09 DIAGNOSIS — K2970 Gastritis, unspecified, without bleeding: Secondary | ICD-10-CM

## 2022-06-09 DIAGNOSIS — R1012 Left upper quadrant pain: Secondary | ICD-10-CM

## 2022-06-09 DIAGNOSIS — R1319 Other dysphagia: Secondary | ICD-10-CM

## 2022-06-09 DIAGNOSIS — K222 Esophageal obstruction: Secondary | ICD-10-CM

## 2022-06-09 MED ORDER — SODIUM CHLORIDE 0.9 % IV SOLN: 500.0000 mL | Freq: Once | INTRAVENOUS | Status: DC

## 2022-06-09 MED ORDER — PANTOPRAZOLE SODIUM 40 MG PO TBEC: 40.0000 mg | DELAYED_RELEASE_TABLET | Freq: Two times a day (BID) | ORAL | 3 refills | Status: DC

## 2022-06-09 NOTE — Progress Notes (Signed)
Called to room to assist during endoscopic procedure.  Patient ID and intended procedure confirmed with present staff. Received instructions for my participation in the procedure from the performing physician.  

## 2022-06-09 NOTE — Patient Instructions (Signed)
Handout on gastritis and esophagitis/stricture given to patient Await pathology results Resume previous diet and continue present medications - pick up prescription for pantoprazole 40 mg twice a day (before 1st and last meal of the day) X 8 weeks from [facility] pharmacy off of [address]  If NSAID's are used on a regular basis then once daily pantoprazole is recommended - avoid aspirin, ibuprofen, naproxen, or other non-steroidal anti-inflammatory drugs   YOU HAD AN ENDOSCOPIC PROCEDURE TODAY AT THE  ENDOSCOPY CENTER:   Refer to the procedure report that was given to you for any specific questions about what was found during the examination.  If the procedure report does not answer your questions, please call your gastroenterologist to clarify.  If you requested that your care partner not be given the details of your procedure findings, then the procedure report has been included in a sealed envelope for you to review at your convenience later.  YOU SHOULD EXPECT: Some feelings of bloating in the abdomen. Passage of more gas than usual.  Walking can help get rid of the air that was put into your GI tract during the procedure and reduce the bloating. If you had a lower endoscopy (such as a colonoscopy or flexible sigmoidoscopy) you may notice spotting of blood in your stool or on the toilet paper. If you underwent a bowel prep for your procedure, you may not have a normal bowel movement for a few days.  Please Note:  You might notice some irritation and congestion in your nose or some drainage.  This is from the oxygen used during your procedure.  There is no need for concern and it should clear up in a day or so.  SYMPTOMS TO REPORT IMMEDIATELY:   Following upper endoscopy (EGD)  Vomiting of blood or coffee ground material  New chest pain or pain under the shoulder blades  Painful or persistently difficult swallowing  New shortness of breath  Fever of 100F or higher  Black,  tarry-looking stools  For urgent or emergent issues, a gastroenterologist can be reached at any hour by calling [phone]. Do not use MyChart messaging for urgent concerns.    DIET:  We do recommend a small meal at first, but then you may proceed to your regular diet.  Drink plenty of fluids but you should avoid alcoholic beverages for 24 hours.  ACTIVITY:  You should plan to take it easy for the rest of today and you should NOT DRIVE or use heavy machinery until tomorrow (because of the sedation medicines used during the test).    FOLLOW UP: Our staff will call the number listed on your records the next business day following your procedure.  We will call around 7:15- 8:00 am to check on you and address any questions or concerns that you may have regarding the information given to you following your procedure. If we do not reach you, we will leave a message.     If any biopsies were taken you will be contacted by phone or by letter within the next 1-3 weeks.  Please call us at [phone] if you have not heard about the biopsies in 3 weeks.    SIGNATURES/CONFIDENTIALITY: You and/or your care partner have signed paperwork which will be entered into your electronic medical record.  These signatures attest to the fact that that the information above on your After Visit Summary has been reviewed and is understood.  Full responsibility of the confidentiality of this discharge information lies  with you and/or your care-partner.

## 2022-06-09 NOTE — Progress Notes (Signed)
Vss nad trans to pacu 

## 2022-06-09 NOTE — Op Note (Signed)
[facility] Patient Name: Alyssa Rangel Procedure Date: 06/09/2022 3:29 PM MRN: [id number] Endoscopist: Beverley Fiedler , MD, [id number] Age: 57 Referring MD:  Date of Birth: 1965/07/19 Gender: Female Account #: [id number] Procedure:                Upper GI endoscopy Indications:              Abdominal pain in the left upper quadrant, Dysphagia Medicines:                Monitored Anesthesia Care Procedure:                Pre-Anesthesia Assessment:                           - Prior to the procedure, a History and Physical                            was performed, and patient medications and                            allergies were reviewed. The patient's tolerance of                            previous anesthesia was also reviewed. The risks                            and benefits of the procedure and the sedation                            options and risks were discussed with the patient.                            All questions were answered, and informed consent                            was obtained. Prior Anticoagulants: The patient has                            taken no anticoagulant or antiplatelet agents. ASA                            Grade Assessment: III - A patient with severe                            systemic disease. After reviewing the risks and                            benefits, the patient was deemed in satisfactory                            condition to undergo the procedure.                           After obtaining informed consent, the endoscope was  passed under direct vision. Throughout the                            procedure, the patient's blood pressure, pulse, and                            oxygen saturations were monitored continuously. The                            Olympus Scope [id number] was introduced through the                            mouth, and advanced to the fourth part of duodenum.                             The upper GI endoscopy was accomplished without                            difficulty. The patient tolerated the procedure                            well. Scope In: Scope Out: Findings:                 One benign-appearing, intrinsic moderate                            (circumferential scarring or stenosis; an endoscope                            may pass) stenosis was found 39 cm from the                            incisors. This stenosis measured 1.3 cm (inner                            diameter) x less than one cm (in length). The                            stenosis was traversed. The scope was withdrawn.                            Dilation was performed with a Maloney dilator with                            mild resistance at 54 Fr.                           The cardia and gastric fundus were normal on                            retroflexion.                           One non-bleeding cratered gastric ulcer was found  in the gastric antrum. The lesion was 6 mm in                            largest dimension. Biopsies were taken with a cold                            forceps for histology and Helicobacter pylori                            testing.                           Diffuse moderate inflammation characterized by                            congestion (edema), erythema and granularity was                            found in the entire examined stomach. Biopsies were                            taken with a cold forceps for Helicobacter pylori                            testing.                           The examined duodenum was normal. Complications:            No immediate complications. Estimated Blood Loss:     Estimated blood loss was minimal. Impression:               - Benign-appearing esophageal stenosis. Dilated                            with 54 Fr. Maloney.                           - Non-bleeding gastric ulcer. Biopsied.                            - Gastritis. Biopsied.                           - Normal examined duodenum. Recommendation:           - Patient has a contact number available for                            emergencies. The signs and symptoms of potential                            delayed complications were discussed with the                            patient. Return to normal activities tomorrow.  Written discharge instructions were provided to the                            patient.                           - Resume previous diet.                           - Continue present medications.                           - Begin pantoprazole 40 mg twice daily (before 1st                            and last meal of the day) x 8 weeks. If NSAIDs are                            used on regular basis then once daily pantoprazole                            is recommended.                           - Avoid aspirin, ibuprofen, naproxen, or other                            non-steroidal anti-inflammatory drugs.                           - Await pathology results. Beverley Fiedler, MD 06/09/2022 3:47:25 PM This report has been signed electronically.

## 2022-06-09 NOTE — Progress Notes (Signed)
GASTROENTEROLOGY PROCEDURE H&P NOTE   Primary Care Physician: Westley Chandler, MD    Reason for Procedure:  Left upper quadrant pain and dysphagia  Plan:    EGD  Patient is appropriate for endoscopic procedure(s) in the ambulatory ([facility]) setting.  The nature of the procedure, as well as the risks, benefits, and alternatives were carefully and thoroughly reviewed with the patient. Ample time for discussion and questions allowed. The patient understood, was satisfied, and agreed to proceed.     HPI: Alyssa Rangel is a 57 y.o. female who presents for EGD.  Medical history as below.   No recent chest pain or shortness of breath.  No abdominal pain today.  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    Type 2 diabetes mellitus (HCC)    Urinary incontinence     Past Surgical History:  Procedure Laterality Date   BILATERAL SALPINGECTOMY  02/06/2012   Procedure: BILATERAL SALPINGECTOMY;  Surgeon: Purcell Nails, MD;  Location: [facility];  Service: Gynecology;  Laterality: Bilateral;  partial bilateral salpingectomy   CHOLECYSTECTOMY N/A 10/31/2012   Procedure: LAPAROSCOPIC CHOLECYSTECTOMY WITH INTRAOPERATIVE CHOLANGIOGRAM;  Surgeon: Mariella Saa, MD;  Location: [facility];  Service: General;  Laterality: N/A;   CYSTOSCOPY  02/06/2012   Procedure: CYSTOSCOPY;  Surgeon: Purcell Nails, MD;  Location: [facility];  Service: Gynecology;  Laterality: N/A;   DILITATION & CURRETTAGE/HYSTROSCOPY WITH NOVASURE ABLATION  02/06/2012   Procedure: DILATATION & CURETTAGE/HYSTEROSCOPY WITH  NOVASURE ABLATION;  Surgeon: Purcell Nails, MD;  Location: [facility];  Service: Gynecology;  Laterality: N/A;   novasure   LAPAROSCOPY  02/06/2012   Procedure: LAPAROSCOPY OPERATIVE;  Surgeon: Purcell Nails, MD;  Location: [facility];  Service: Gynecology;  Laterality: N/A;   OVARIAN CYST REMOVAL  02/06/2012   Procedure: OVARIAN CYSTECTOMY;  Surgeon: Purcell Nails, MD;  Location: [facility];  Service: Gynecology;  Laterality: Bilateral;   TOTAL HIP ARTHROPLASTY Right 10/09/2012   Procedure: RIGHT TOTAL HIP ARTHROPLASTY;  Surgeon: Loanne Drilling, MD;  Location: [facility];  Service: Orthopedics;  Laterality: Right;    Prior to Admission medications   Medication Sig Start Date End Date Taking? Authorizing Provider  albuterol (VENTOLIN HFA) 108 (90 Base) MCG/ACT inhaler Inhale 2 puffs into the lungs every 6 (six) hours as needed for wheezing or shortness of breath. 06/21/20  Yes Westley Chandler, MD  Cholecalciferol (VITAMIN D3) 1000 units CAPS Take 1,000 Units by mouth daily.   Yes [provider]  dicyclomine (BENTYL) 20 MG tablet Take 1 tablet (20 mg total) by mouth 2 (two) times daily. 01/11/22  Yes Aberman, Merla Riches, PA-C  metFORMIN (GLUCOPHAGE-XR) 500 MG 24 hr tablet Take 1 tablet (500 mg total) by mouth daily with breakfast. 01/27/20  Yes Westley Chandler, MD  rosuvastatin (CRESTOR) 20 MG tablet Take 1 tablet (20 mg total) by mouth daily. 06/23/20  Yes Westley Chandler, MD  vitamin C (ASCORBIC ACID)  500 MG tablet Take 500 mg by mouth daily.   Yes [provider]  amLODipine (NORVASC) 10 MG tablet Take 1 tablet (10 mg total) by mouth daily. 01/10/22 02/09/22  Jerald Kief, MD  clobetasol ointment (TEMOVATE) 0.05 % Apply 1 Application topically 2 (two) times daily. Apply as directed twice daily, use for up to 2 weeks as needed. 02/23/22   Romualdo Bolk, MD  ibuprofen (ADVIL) 200 MG tablet Take 200-600 mg by mouth every 6 (six) hours as needed for mild pain or headache.    [provider]  losartan (COZAAR) 50 MG tablet Take 1 tablet (50 mg total) by mouth daily. 01/10/22 02/09/22  Jerald Kief, MD    Current Outpatient Medications  Medication Sig Dispense Refill   albuterol (VENTOLIN HFA) 108 (90 Base) MCG/ACT inhaler Inhale 2 puffs into the lungs every 6 (six) hours as needed for wheezing or shortness of breath. 8 g 0   Cholecalciferol (VITAMIN D3) 1000 units CAPS Take 1,000 Units by mouth daily.     dicyclomine (BENTYL) 20 MG tablet Take 1 tablet (20 mg total) by mouth 2 (two) times daily. 20 tablet 0   metFORMIN (GLUCOPHAGE-XR) 500 MG 24 hr tablet Take 1 tablet (500 mg total) by mouth daily with breakfast. 90 tablet 3   rosuvastatin (CRESTOR) 20 MG tablet Take 1 tablet (20 mg total) by mouth daily. 90 tablet 3   vitamin C (ASCORBIC ACID) 500 MG tablet Take 500 mg by mouth daily.     amLODipine (NORVASC) 10 MG tablet Take 1 tablet (10 mg total) by mouth daily. 30 tablet 0   clobetasol ointment (TEMOVATE) 0.05 % Apply 1 Application topically 2 (two) times daily. Apply as directed twice daily, use for up to 2 weeks as needed. 60 g 0   ibuprofen (ADVIL) 200 MG tablet Take 200-600 mg by mouth every 6 (six) hours as needed for mild pain or headache.     losartan (COZAAR) 50 MG tablet Take 1 tablet (50 mg total) by mouth daily. 30 tablet 0   Current Facility-Administered Medications  Medication Dose Route Frequency Provider Last Rate Last Admin   0.9 %  sodium chloride infusion  500 mL Intravenous Once Roanne Haye, Carie Caddy, MD        Allergies as of 06/09/2022   (No Known Allergies)    Family History  Problem Relation Age of Onset   Diabetes Mother        insulin dependent   Hypertension Mother    Other Father        unknown   Multiple sclerosis Sister    Heart disease Maternal Grandmother    Diabetes Maternal Grandmother    Hypertension Maternal Grandmother    Arthritis Maternal Grandmother    Cancer Neg Hx    Stroke Neg Hx    Colon cancer Neg Hx     Esophageal cancer Neg Hx    Rectal cancer Neg Hx    Stomach cancer Neg Hx    Breast cancer Neg Hx    Ovarian cancer Neg Hx     Social History   Socioeconomic History   Marital status: Single    Spouse name: Not on file   Number of children: Not on file   Years of education: Not on file   Highest education level: Not on file  Occupational History   Not on file  Tobacco Use   Smoking status: Former    Packs/day: 1.00  Years: 25.00    Additional pack years: 0.00    Total pack years: 25.00    Types: Cigarettes    Quit date: 10/02/2011    Years since quitting: 10.6   Smokeless tobacco: Never  Vaping Use   Vaping Use: Never used  Substance and Sexual Activity   Alcohol use: Yes    Alcohol/week: 2.0 standard drinks of alcohol    Types: 2 Glasses of wine per week    Comment: occ.   Drug use: No   Sexual activity: Yes    Partners: Male    Birth control/protection: None  Other Topics Concern   Not on file  Social History Narrative   Single, no prior pregnancy, lives with mother, exercise - active on the job, walking, CNA at [employer]   Financial Resource Strain: Not on file  Food Insecurity: No Food Insecurity (01/05/2022)   Hunger Vital Sign    Worried About Running Out of Food in the Last Year: Never true    Ran Out of Food in the Last Year: Never true  Transportation Needs: No Transportation Needs (01/05/2022)   PRAPARE - Administrator, Civil Service (Medical): No    Lack of Transportation (Non-Medical): No  Physical Activity: Not on file  Stress: Not on file  Social Connections: Not on file  Intimate Partner Violence: Not At Risk (01/05/2022)   Humiliation, Afraid, Rape, and Kick questionnaire    Fear of Current or Ex-Partner: No    Emotionally Abused: No    Physically Abused: No    Sexually Abused: No    Physical Exam: Vital signs in last 24 hours: @BP  (!) 163/85   Pulse 69   Temp 99.1 F (37.3 C)   Ht 5\' 4"   (1.626 m)   Wt 254 lb (115.2 kg)   SpO2 98%   BMI 43.60 kg/m  GEN: NAD EYE: Sclerae anicteric ENT: MMM CV: Non-tachycardic Pulm: CTA b/l GI: Soft, NT/ND NEURO:  Alert & Oriented x 3   Erick Blinks, MD [facility]  06/09/2022 3:28 PM

## 2022-06-09 NOTE — Progress Notes (Signed)
Pt's states no medical or surgical changes since previsit or office visit. 

## 2022-06-12 ENCOUNTER — Telehealth: Payer: Self-pay | Admitting: *Deleted

## 2022-06-12 NOTE — Telephone Encounter (Signed)
  Follow up Call-     06/09/2022    2:43 PM 04/28/2020    7:29 AM  Call back number  Post procedure Call Back phone  # [phone]  Permission to leave phone message Yes Yes     Patient questions:  Do you have a fever, pain , or abdominal swelling? No. Pain Score  0 *  Have you tolerated food without any problems? Yes.    Have you been able to return to your normal activities? Yes.    Do you have any questions about your discharge instructions: Diet   No. Medications  No. Follow up visit  No.  Do you have questions or concerns about your Care? No.  Actions: * If pain score is 4 or above: No action needed, pain <4.

## 2022-06-15 ENCOUNTER — Other Ambulatory Visit: Payer: Self-pay | Admitting: *Deleted

## 2022-06-15 ENCOUNTER — Encounter: Payer: Self-pay | Admitting: Family Medicine

## 2022-06-15 ENCOUNTER — Telehealth: Payer: Self-pay | Admitting: *Deleted

## 2022-06-15 DIAGNOSIS — R1012 Left upper quadrant pain: Secondary | ICD-10-CM

## 2022-06-15 DIAGNOSIS — B9681 Helicobacter pylori [H. pylori] as the cause of diseases classified elsewhere: Secondary | ICD-10-CM | POA: Insufficient documentation

## 2022-06-15 MED ORDER — BISMUTH SUBSALICYLATE 262 MG PO CAPS: 2.0000 | ORAL_CAPSULE | Freq: Four times a day (QID) | ORAL | 0 refills | Status: AC

## 2022-06-15 MED ORDER — AMOXICILLIN 500 MG PO CAPS: 500.0000 mg | ORAL_CAPSULE | Freq: Four times a day (QID) | ORAL | 0 refills | Status: AC

## 2022-06-15 MED ORDER — METRONIDAZOLE 250 MG PO TABS: 250.0000 mg | ORAL_TABLET | Freq: Four times a day (QID) | ORAL | 0 refills | Status: AC

## 2022-06-15 NOTE — Telephone Encounter (Signed)
-----   Message from Beverley Fiedler, MD sent at 06/15/2022  8:28 AM EDT ----- Positive H. pylori by biopsy Gastric ulcer seen at EGD  Ready on pantoprazole 40 mg twice daily  Please treat the patient with Pylera or Talicia.  Okay to break apart into its generic components if necessary for insurance coverage Twice daily PPI will need to be used with the antibiotics Please send Diathe[employer] H. pylori stool test 8 weeks after antibiotic therapy to ensure eradication  Notify patient by phone about biopsy results and need for H. pylori treatment Once notified pathology letter is not needed

## 2022-06-15 NOTE — Telephone Encounter (Signed)
Patient called to inform results and medications; not able to contact at this time. LMTCB

## 2022-08-10 ENCOUNTER — Telehealth: Payer: Self-pay | Admitting: *Deleted

## 2022-08-10 NOTE — Telephone Encounter (Signed)
-----   Message from Nurse Kerrie Buffalo sent at 08/10/2022  8:14 AM EDT -----  ----- Message ----- From: Avanell Shackleton, RN Sent: 08/10/2022  12:00 AM EDT To: Chrystie Nose, RN  Reminder for Diatherixa H. Pylori stool test 8 weeks after antibiotic therapy to ensure radication.

## 2022-08-10 NOTE — Telephone Encounter (Signed)
Called patient to be reminded of Diathe[employer] stool kit pick up. Message left as per DPR for leaving detailed message. Also informed patient she will be notified again the closer to the date she is expected to start, which is 08/28/22.

## 2022-08-15 ENCOUNTER — Telehealth: Payer: Self-pay | Admitting: *Deleted

## 2022-08-15 NOTE — Telephone Encounter (Signed)
Called patient to inform to come to pick up the Diatherix stool kit from the 2nd floor. Patient agreed.

## 2022-08-24 ENCOUNTER — Telehealth: Payer: Self-pay | Admitting: *Deleted

## 2022-08-24 NOTE — Telephone Encounter (Signed)
Called patient and left message to remind patient to pick up Diatherix stool kit from the 2nd floor.

## 2022-08-24 NOTE — Telephone Encounter (Signed)
-----   Message from Nurse Alona Bene B sent at 08/10/2022 12:09 PM EDT ----- Reminder to call patient after lab form is signed via Dr. Rhea Belton, to pick up Diatherix stool from office during the week of 08/21/22. Stool collection will start 08/28/22.

## 2022-09-15 ENCOUNTER — Encounter: Payer: Self-pay | Admitting: Pharmacist

## 2022-09-22 NOTE — Progress Notes (Unsigned)
SUBJECTIVE:   CHIEF COMPLAINT: check up HPI:   Alyssa Rangel is a 57 y.o.  with history notable for type 2 DM (well controlled)  presenting for follow up.   She reports some significant stress at her job.  She reports mood swings at her job.  She has support for this.  She reports has always had mood swings.  She is not interested in counseling or medication to cure this at this time.  She will let me know if she needs additional services.  She denies thoughts of hurting herself or others.  She works at a nursing facility..  In terms of her diabetes she is not taking urinary of her medications.  She does have increased urinary frequency particularly at night.  No nausea or vomiting.  She is interested in medication for weight loss.  The patient reports bilateral lower extremity edema at the end of the day.  This was worse a while ago.  Denies chest pain, palpitations, difficulty breathing.  She is able to lie flat at night.  She is taking NSAIDs about twice a week.  The patient reports continued right knee pain.  She is taking ibuprofen twice a week and some Tylenol.  She is some clicking.  She has no falls but sometimes feels like her knee wants to give out.  She is interested in something to control the pain.  No redness or swelling.  The patient reports urinary frequency.  Has been ongoing for months.  This coincides with going off of her diabetes medications.  No hematuria or burning with urination.  The patient reports a breast rash.  She has been using nystatin.  She also reports her right breast cyst as she calls it.  No drainage.  No history of cyst no family history of breast cancer.    PERTINENT  PMH / PSH/Family/Social History :  H. Pylori--she wonders if she should continue to take pantoprazole.  She has been off it for about a month.  She is going to complete the stool study we discussed the importance of this today. Ovarian Mass number was provided for  gynecology.  OBJECTIVE:   BP (!) 143/59   Pulse 80   Ht 5\' 4"  (1.626 m)   Wt 260 lb 9.6 oz (118.2 kg)   SpO2 99%   BMI 44.73 kg/m   Today's weight:  Last Weight  Most recent update: 09/25/2022  8:40 AM    Weight  118.2 kg (260 lb 9.6 oz)            Review of prior weights: [employer]   09/25/22 0839  Weight: 260 lb 9.6 oz (118.2 kg)    RRR Lungs clear Neck full  Abdomen soft, NT No LE today Right knee is examined there is good range of motion mild medial joint line tenderness.  No swelling or erythema.  There is no effusion  Breast exam was chaperoned by Desiree.  Bilateral breasts are symmetric in appearance.  There is hyperpigmentation with thickening of the skin in the inframammary folds.  At the 4 o'clock position on the left breast very inferior there is a 1 x 1 cm mobile nonfluctuant mass that is relatively soft. ASSESSMENT/PLAN:   Assessment & Plan Leukocytosis, unspecified type Repeat CBC today.  This was during a time when she was in the hospital Type 2 diabetes mellitus without complication, without long-term current use of insulin (HCC) Discussed A1c at length.  Restarted metformin and GLP-1.  A follow-up foot exam and urine evaluation. Primary hypertension The patient is hesitant to restart blood pressure pills.  Her systolic is slightly above goal today.  Her diastolic is rather low.  She would prefer to avoid medications if possible, monitor as an outpatient she will follow-up closely for repeat examination. Screening mammogram for breast cancer On left screening left side exam Intertrigo Likely worsening due to diabetes it does not appear to be consistent with a contact dermatitis.  Prescribed fluconazole weekly and start nystatin powder.  Discussed using a cotton undergarment without an underwire. Lower extremity edema Will discuss further at follow-up.  Differential includes cirrhosis, nephrotic syndrome or heart failure.  I think these are all  less likely and her symptoms seem most consistent with venous stasis in the setting of weight gain as well as standing for long periods of time at her job.  However we will check kidney and liver function today.  No signs or symptoms of heart failure consider echocardiogram or BNP at follow-up if persist.  NSAIDs also be worsening for this Encounter for immunization Flu shot today Abnormal breast exam Right side mammogram ordered for diagnostic purposes Stress reaction Supportive listening provided.  Offered services.  She declined counseling at this time.  Close follow-up scheduled.  Healthcare maintenance I put information in her after visit summary about lung cancer screening at her next visit.    Terisa Starr, MD  Family Medicine Teaching Service  [facility]

## 2022-09-25 ENCOUNTER — Other Ambulatory Visit: Payer: Self-pay

## 2022-09-25 ENCOUNTER — Ambulatory Visit (INDEPENDENT_AMBULATORY_CARE_PROVIDER_SITE_OTHER): Payer: Self-pay | Admitting: Family Medicine

## 2022-09-25 ENCOUNTER — Encounter: Payer: Self-pay | Admitting: Family Medicine

## 2022-09-25 VITALS — BP 143/59 | HR 80 | Ht 64.0 in | Wt 260.6 lb

## 2022-09-25 DIAGNOSIS — E119 Type 2 diabetes mellitus without complications: Secondary | ICD-10-CM

## 2022-09-25 DIAGNOSIS — I10 Essential (primary) hypertension: Secondary | ICD-10-CM

## 2022-09-25 DIAGNOSIS — D72829 Elevated white blood cell count, unspecified: Secondary | ICD-10-CM

## 2022-09-25 DIAGNOSIS — F430 Acute stress reaction: Secondary | ICD-10-CM

## 2022-09-25 DIAGNOSIS — L304 Erythema intertrigo: Secondary | ICD-10-CM

## 2022-09-25 DIAGNOSIS — N6459 Other signs and symptoms in breast: Secondary | ICD-10-CM

## 2022-09-25 DIAGNOSIS — R600 Localized edema: Secondary | ICD-10-CM

## 2022-09-25 DIAGNOSIS — Z1231 Encounter for screening mammogram for malignant neoplasm of breast: Secondary | ICD-10-CM

## 2022-09-25 DIAGNOSIS — Z23 Encounter for immunization: Secondary | ICD-10-CM

## 2022-09-25 LAB — POCT GLYCOSYLATED HEMOGLOBIN (HGB A1C): HbA1c, POC (controlled diabetic range): 7.4 % — AB (ref 0.0–7.0)

## 2022-09-25 MED ORDER — NYSTATIN 100000 UNIT/GM EX POWD: 1.0000 | Freq: Three times a day (TID) | CUTANEOUS | 0 refills | Status: DC

## 2022-09-25 MED ORDER — SEMAGLUTIDE(0.25 OR 0.5MG/DOS) 2 MG/1.5ML ~~LOC~~ SOPN
0.2500 mg | PEN_INJECTOR | SUBCUTANEOUS | 1 refills | Status: DC
Start: 2022-09-25 — End: 2022-11-06

## 2022-09-25 MED ORDER — METFORMIN HCL ER 500 MG PO TB24
500.0000 mg | ORAL_TABLET | Freq: Every day | ORAL | 3 refills | Status: DC
Start: 2022-09-25 — End: 2023-01-05

## 2022-09-25 MED ORDER — ROSUVASTATIN CALCIUM 20 MG PO TABS: 20.0000 mg | ORAL_TABLET | Freq: Every day | ORAL | 3 refills | Status: DC

## 2022-09-25 MED ORDER — FLUCONAZOLE 150 MG PO TABS
150.0000 mg | ORAL_TABLET | ORAL | 0 refills | Status: DC
Start: 2022-09-25 — End: 2023-03-09

## 2022-09-25 MED ORDER — DICLOFENAC SODIUM 1 % EX GEL: 4.0000 g | Freq: Four times a day (QID) | CUTANEOUS | 1 refills | Status: DC

## 2022-09-25 NOTE — Assessment & Plan Note (Signed)
Discussed A1c at length.  Restarted metformin and GLP-1.  A follow-up foot exam and urine evaluation.

## 2022-09-25 NOTE — Assessment & Plan Note (Signed)
The patient is hesitant to restart blood pressure pills.  Her systolic is slightly above goal today.  Her diastolic is rather low.  She would prefer to avoid medications if possible, monitor as an outpatient she will follow-up closely for repeat examination.

## 2022-09-25 NOTE — Patient Instructions (Addendum)
It was wonderful to see you today.  Please bring ALL of your medications with you to every visit.   Today we talked about:  For your knee - I sent in a gel to use up to 4 times per day--this is also over the counter so your insurance may not cover it   - Tylenol-- 650 mg three times per day  - You can use the Salonpas on the area    For your yeast infection - I sent in a pill to take once a week for 4 weeks - I recommend nystatin to the area   --At your next visit we will discuss getting a CT scan of your lungs gtiven your smoking history  - Please be sure to complete your test for Gastroenterology call them about the Protonix   Call Gynecology about your cyst removal  [facility] for [facility] at [facility] [address] [place], [place] [address] [phone]   [facility] for [facility] Healthcare at [facility] [address] [place], [place] [address] [phone] Locate on Map  I recommend you undergo a mammogram.   You can call to schedule an appointment by calling [phone].   Directions [address] [place], [place] [address]  Please let me know if you have questions. I will send you a letter or call you with results.    Please follow up in 1 months for your annual exam   Thank you for choosing [facility].   Please call [phone] with any questions about today's appointment.  Please be sure to schedule follow up at the front  desk before you leave today.   Terisa Starr, MD  Family Medicine

## 2022-09-26 LAB — LIPID PANEL
Chol/HDL Ratio: 8.5 ratio — ABNORMAL HIGH (ref 0.0–4.4)
Cholesterol, Total: 272 mg/dL — ABNORMAL HIGH (ref 100–199)
HDL: 32 mg/dL — ABNORMAL LOW (ref >39)
LDL Chol Calc (NIH): 200 mg/dL — ABNORMAL HIGH (ref 0–99)
Triglycerides: 204 mg/dL — ABNORMAL HIGH (ref 0–149)
VLDL Cholesterol Cal: 40 mg/dL (ref 5–40)

## 2022-09-26 LAB — HEPATIC FUNCTION PANEL
ALT: 16 IU/L (ref 0–32)
AST: 12 IU/L (ref 0–40)
Albumin: 4 g/dL (ref 3.8–4.9)
Alkaline Phosphatase: 90 IU/L (ref 44–121)
Bilirubin Total: 0.3 mg/dL (ref 0.0–1.2)
Bilirubin, Direct: <0.1 mg/dL (ref 0.00–0.40)
Total Protein: 6.8 g/dL (ref 6.0–8.5)

## 2022-09-26 LAB — CBC
Hematocrit: 42 % (ref 34.0–46.6)
Hemoglobin: 13.4 g/dL (ref 11.1–15.9)
MCH: 27.2 pg (ref 26.6–33.0)
MCHC: 31.9 g/dL (ref 31.5–35.7)
MCV: 85 fL (ref 79–97)
Platelets: 303 10*3/uL (ref 150–450)
RBC: 4.93 x10E6/uL (ref 3.77–5.28)
RDW: 13.2 % (ref 11.7–15.4)
WBC: 7.2 10*3/uL (ref 3.4–10.8)

## 2022-09-26 LAB — BASIC METABOLIC PANEL
BUN/Creatinine Ratio: 21 (ref 9–23)
BUN: 13 mg/dL (ref 6–24)
CO2: 24 mmol/L (ref 20–29)
Calcium: 9.4 mg/dL (ref 8.7–10.2)
Chloride: 102 mmol/L (ref 96–106)
Creatinine, Ser: 0.62 mg/dL (ref 0.57–1.00)
Glucose: 106 mg/dL — ABNORMAL HIGH (ref 70–99)
Potassium: 4.5 mmol/L (ref 3.5–5.2)
Sodium: 140 mmol/L (ref 134–144)
eGFR: 103 mL/min/{1.73_m2} (ref >59)

## 2022-09-26 LAB — TSH RFX ON ABNORMAL TO FREE T4: TSH: 1.76 u[IU]/mL (ref 0.450–4.500)

## 2022-09-27 ENCOUNTER — Other Ambulatory Visit: Payer: Self-pay | Admitting: Family Medicine

## 2022-09-27 DIAGNOSIS — N6459 Other signs and symptoms in breast: Secondary | ICD-10-CM

## 2022-10-19 ENCOUNTER — Other Ambulatory Visit: Payer: No Typology Code available for payment source

## 2022-10-20 NOTE — Progress Notes (Signed)
Clinic canceled

## 2022-10-23 ENCOUNTER — Encounter (INDEPENDENT_AMBULATORY_CARE_PROVIDER_SITE_OTHER): Payer: Self-pay | Admitting: Family Medicine

## 2022-10-23 DIAGNOSIS — E119 Type 2 diabetes mellitus without complications: Secondary | ICD-10-CM

## 2022-10-23 DIAGNOSIS — I10 Essential (primary) hypertension: Secondary | ICD-10-CM

## 2022-11-03 NOTE — Progress Notes (Unsigned)
SUBJECTIVE:   CHIEF COMPLAINT: check up  HPI:   Alyssa Rangel is a 57 y.o.  with history notable for type 2 DM and elevated BP presenting for check up.   The patient reports overall she is doing okay.  Her mom and sister are both ill.  Her sister has multiple sclerosis and is in a nursing facility.  The patient works at [employer] and has multiple job stressors.  She is on her feet all day.  She does not have additional time during the day to really focus on herself.  Reports frustration with inability to lose weight.  She maintains a healthy diet.  She does not exercise regularly.  She was unable to get Ozempic at her pharmacy due to being on backorder.  Diabetes She has restarted her statin medication.  She reports compliance.  She is very much trying to lose weight as above.  She eats lean meats excess greens and tries to focus on reducing sugar sweetened beverages.  Edema This is similar.  After monitoring this she reports is mostly at the end of the day.  This improves with elevation.  Reviewed her labs which were unremarkable.  The patient reports intermittent muscle spasms in her shoulders after working all day.  She has been using ibuprofen a total of 800 mg about every day.  No new symptoms.  She previously used muscle laxer with relief.  Tylenol works somewhat for her.  She did not take her ARB today. She denies HA, CP, or dyspnea.   PERTINENT  PMH / PSH/Family/Social History : obesity, quit tobacco  OBJECTIVE:   BP (!) 148/67   Pulse 63   Ht 5\' 4"  (1.626 m)   Wt 267 lb 9.6 oz (121.4 kg)   SpO2 98%   BMI 45.93 kg/m   Today's weight:  Last Weight  Most recent update: 11/06/2022  9:35 AM    Weight  121.4 kg (267 lb 9.6 oz)            Review of prior weights: [employer]   11/06/22 0935  Weight: 267 lb 9.6 oz (121.4 kg)    Pleasant, intermittently tearful RRR Lungs clear Feet sensate +flaking and scaling between digits on R    ASSESSMENT/PLAN:   Assessment & Plan Primary hypertension Not at goal discussed options at length.  She will stop ibuprofen she will monitor her blood pressure.  If this persist we will start combination pill of HCTZ losartan. Type 2 diabetes mellitus without complication, without long-term current use of insulin (HCC) Sent Ozempic to a pharmacy that may have it.  Discussed dietary changes.  Referral to nutrition.  Foot exam completed referral to ophthalmology Hypercholesterolemia Repeat.  She is not taking her Crestor. Encounter for immunization COVID given today History of Helicobacter pylori infection No longer taking PPI for more than 4 weeks.  Test of cure today. Former smokeless tobacco use CT scheduled. Trapezius muscle spasm Recommended using Tylenol rest ice heat as needed Flexeril given to use intermittently.  She does not get sedated from this or have issues with it.  She only uses it intermittently she will reduce her ibuprofen use. Tinea pedis of both feet Noted on exam.  Prescribed antifungal.  Dependent edema, likely due to venous stasis.  Discussed elevating legs could consider compression stockings in future.  Laboratory workup was unremarkable.  I do not suspect heart failure at this time.  Continue to monitor.  Anticipate that weight loss will help.  At follow-up will confirm she is no longer taking amlodipine.  Other stressor.  Supportive listening provided.  She declines additional support at this time.  Healthcare maintenance, information given to call [facility].  [place] [facility] does not take her insurance she reports.  Terisa Starr, MD  Family Medicine Teaching Service  [facility]

## 2022-11-03 NOTE — Patient Instructions (Signed)
It was wonderful to see you today.  Please bring ALL of your medications with you to every visit.   Today we talked about:  Monitor your blood pressure at work Message with results   I sent in a cream for between your toes  Ozempic  0.25 mg weekly This is at [facility]  [address] [place]  I sent in Flexeril to use  Limit Ibuprofen to 1--2 times per week  We will check your cholesterol and kidneys today  I recommend an annual eye exam  I recommend you undergo a mammogram.   Located in: [facility] Address: [address] [place], [place], [place] [address] Phone: [phone]   Directions [address] [place], [place] [address]  Please let me know if you have questions. I will send you a letter or call you with results.    Please follow up in 2-3 weeks for BP   Thank you for choosing [facility].   Please call [phone] with any questions about today's appointment.  Please be sure to schedule follow up at the front  desk before you leave today.   Terisa Starr, MD  Family Medicine

## 2022-11-06 ENCOUNTER — Encounter: Payer: Self-pay | Admitting: Family Medicine

## 2022-11-06 ENCOUNTER — Other Ambulatory Visit (HOSPITAL_COMMUNITY): Payer: Self-pay

## 2022-11-06 ENCOUNTER — Ambulatory Visit (INDEPENDENT_AMBULATORY_CARE_PROVIDER_SITE_OTHER): Payer: Self-pay | Admitting: Family Medicine

## 2022-11-06 ENCOUNTER — Other Ambulatory Visit: Payer: Self-pay

## 2022-11-06 VITALS — BP 148/67 | HR 63 | Ht 64.0 in | Wt 267.6 lb

## 2022-11-06 DIAGNOSIS — Z87891 Personal history of nicotine dependence: Secondary | ICD-10-CM

## 2022-11-06 DIAGNOSIS — I10 Essential (primary) hypertension: Secondary | ICD-10-CM

## 2022-11-06 DIAGNOSIS — E119 Type 2 diabetes mellitus without complications: Secondary | ICD-10-CM

## 2022-11-06 DIAGNOSIS — Z8619 Personal history of other infectious and parasitic diseases: Secondary | ICD-10-CM

## 2022-11-06 DIAGNOSIS — E7800 Pure hypercholesterolemia, unspecified: Secondary | ICD-10-CM

## 2022-11-06 DIAGNOSIS — B353 Tinea pedis: Secondary | ICD-10-CM

## 2022-11-06 DIAGNOSIS — M62838 Other muscle spasm: Secondary | ICD-10-CM

## 2022-11-06 DIAGNOSIS — Z23 Encounter for immunization: Secondary | ICD-10-CM

## 2022-11-06 MED ORDER — CYCLOBENZAPRINE HCL 10 MG PO TABS: 10.0000 mg | ORAL_TABLET | Freq: Three times a day (TID) | ORAL | 0 refills | Status: DC | PRN

## 2022-11-06 MED ORDER — CLOTRIMAZOLE 1 % EX CREA: 1.0000 | TOPICAL_CREAM | Freq: Two times a day (BID) | CUTANEOUS | 0 refills | Status: DC

## 2022-11-06 MED ORDER — SEMAGLUTIDE(0.25 OR 0.5MG/DOS) 2 MG/3ML ~~LOC~~ SOPN
0.2500 mg | PEN_INJECTOR | SUBCUTANEOUS | 1 refills | Status: DC
  Filled 2022-11-06: qty 3, 42d supply, fill #0

## 2022-11-06 NOTE — Assessment & Plan Note (Signed)
Not at goal discussed options at length.  She will stop ibuprofen she will monitor her blood pressure.  If this persist we will start combination pill of HCTZ losartan.

## 2022-11-06 NOTE — Assessment & Plan Note (Signed)
Repeat.  She is not taking her Crestor.

## 2022-11-06 NOTE — Assessment & Plan Note (Signed)
Sent Ozempic to a pharmacy that may have it.  Discussed dietary changes.  Referral to nutrition.  Foot exam completed referral to ophthalmology

## 2022-11-07 ENCOUNTER — Encounter: Payer: Self-pay | Admitting: Family Medicine

## 2022-11-08 LAB — BASIC METABOLIC PANEL
BUN/Creatinine Ratio: 21 (ref 9–23)
BUN: 13 mg/dL (ref 6–24)
CO2: 24 mmol/L (ref 20–29)
Calcium: 9.3 mg/dL (ref 8.7–10.2)
Chloride: 104 mmol/L (ref 96–106)
Creatinine, Ser: 0.61 mg/dL (ref 0.57–1.00)
Glucose: 116 mg/dL — ABNORMAL HIGH (ref 70–99)
Potassium: 4.8 mmol/L (ref 3.5–5.2)
Sodium: 141 mmol/L (ref 134–144)
eGFR: 103 mL/min/{1.73_m2} (ref >59)

## 2022-11-08 LAB — MICROALBUMIN / CREATININE URINE RATIO
Creatinine, Urine: 102.8 mg/dL
Microalb/Creat Ratio: 114 mg/g{creat} — ABNORMAL HIGH (ref 0–29)
Microalbumin, Urine: 117.2 ug/mL

## 2022-11-08 LAB — LDL CHOLESTEROL, DIRECT: LDL Direct: 205 mg/dL — ABNORMAL HIGH (ref 0–99)

## 2022-11-08 LAB — H. PYLORI BREATH TEST: H pylori Breath Test: NEGATIVE

## 2022-11-14 ENCOUNTER — Ambulatory Visit (HOSPITAL_COMMUNITY)
Admission: RE | Admit: 2022-11-14 | Discharge: 2022-11-14 | Disposition: A | Discharge status: Home / Self Care | Payer: No Typology Code available for payment source | Source: Ambulatory Visit | Attending: Family Medicine | Admitting: Family Medicine

## 2022-11-14 DIAGNOSIS — Z87891 Personal history of nicotine dependence: Secondary | ICD-10-CM | POA: Insufficient documentation

## 2022-11-17 MED ORDER — LOSARTAN POTASSIUM 50 MG PO TABS: 50.0000 mg | ORAL_TABLET | Freq: Every day | ORAL | 3 refills | Status: DC

## 2022-11-20 ENCOUNTER — Other Ambulatory Visit (HOSPITAL_COMMUNITY): Payer: Self-pay

## 2022-11-27 ENCOUNTER — Other Ambulatory Visit (HOSPITAL_COMMUNITY): Payer: Self-pay

## 2022-11-27 ENCOUNTER — Telehealth: Payer: Self-pay

## 2022-11-27 NOTE — Telephone Encounter (Signed)
Pharmacy Patient Advocate Encounter   Received notification from CoverMyMeds that prior authorization for [facility] is required/requested.   Insurance verification completed.   The patient is insured through [facility] .   PA required; PA submitted to above mentioned insurance via CoverMyMeds Key/confirmation #/EOC [id number]. Status is pending

## 2022-12-01 NOTE — Progress Notes (Unsigned)
    SUBJECTIVE:   CHIEF COMPLAINT: diabetes  HPI:   Alyssa Rangel is a 57 y.o.  with history notable for obesity, type 2 DM and dyslipidemia presenting for follow up   Reports continued stress at work. Feels anxious at times then it bubbles up as worry/speaking out. Family history of bipolar on dad's side. No episodes of mania. Has had some depression in past. Never been on a medication. Interested in medication or therapy pending how she does.  She is taking her medications as prescribed. No side effects. Ozempic has a copay of $125. Trulicity is preferred by insurance.   She continues to have tinea pedis despite antifungal use for 4 weeks.   PERTINENT  PMH / PSH/Family/Social History : tobacco use, DM  OBJECTIVE:   BP 136/60   Pulse 86   Ht 5\' 4"  (1.626 m)   Wt 267 lb 6.4 oz (121.3 kg)   SpO2 95%   BMI 45.90 kg/m   Today's weight:  Last Weight  Most recent update: 12/04/2022 10:10 AM    Weight  121.3 kg (267 lb 6.4 oz)            Review of prior weights: Filed Weights   12/04/22 1009  Weight: 267 lb 6.4 oz (121.3 kg)     Cardiac: Regular rate and rhythm. Normal S1/S2. No murmurs, rubs, or gallops appreciated. Lungs: Clear bilaterally to ascultation.   Feet + evidence of trauma to digits 1/2 nails  + scaling between digits 4/5   ASSESSMENT/PLAN:   Assessment & Plan Hypercholesterolemia Encouraged compliance with statin--she was not taking on last check Repeat LDL at follow up  Congratulated on making lifestyle changes Type 2 diabetes mellitus without complication, without long-term current use of insulin (HCC) Near goal for A1C but desires weight loss Trulicity Rx--she will message about cost  Up to date on other items  Need for shingles vaccine Rx for second dose Primary hypertension At goal BMP today  Encounter for immunization PCV20  Intertrigo Refilled nystatin  Tinea pedis of both feet Rx clotrimazole  Discussed options, referral to  Podiatry  Dermatitis Refilled, doing well on this  Stress reaction Suspect GAD Information on Buspar sent  Discussed medication vs. Counseling-- she will think about thi   Due for A1C in 1 month, check stress, ask about Buspar   Terisa Starr, MD  Family Medicine Teaching Service  [facility]

## 2022-12-01 NOTE — Telephone Encounter (Signed)
Pharmacy Patient Advocate Encounter  Received notification from [facility] that Prior Authorization for [facility] has been DENIED.  Full denial letter will be uploaded to the media tab. See denial reason below.

## 2022-12-04 ENCOUNTER — Encounter: Payer: Self-pay | Admitting: Family Medicine

## 2022-12-04 ENCOUNTER — Telehealth: Payer: Self-pay

## 2022-12-04 ENCOUNTER — Other Ambulatory Visit (HOSPITAL_COMMUNITY): Payer: Self-pay

## 2022-12-04 ENCOUNTER — Ambulatory Visit: Payer: No Typology Code available for payment source | Admitting: Family Medicine

## 2022-12-04 VITALS — BP 136/60 | HR 86 | Ht 64.0 in | Wt 267.4 lb

## 2022-12-04 DIAGNOSIS — I10 Essential (primary) hypertension: Secondary | ICD-10-CM | POA: Diagnosis not present

## 2022-12-04 DIAGNOSIS — E119 Type 2 diabetes mellitus without complications: Secondary | ICD-10-CM | POA: Diagnosis not present

## 2022-12-04 DIAGNOSIS — F430 Acute stress reaction: Secondary | ICD-10-CM

## 2022-12-04 DIAGNOSIS — Z23 Encounter for immunization: Secondary | ICD-10-CM | POA: Diagnosis not present

## 2022-12-04 DIAGNOSIS — L304 Erythema intertrigo: Secondary | ICD-10-CM

## 2022-12-04 DIAGNOSIS — E7800 Pure hypercholesterolemia, unspecified: Secondary | ICD-10-CM | POA: Diagnosis not present

## 2022-12-04 DIAGNOSIS — B353 Tinea pedis: Secondary | ICD-10-CM

## 2022-12-04 DIAGNOSIS — L309 Dermatitis, unspecified: Secondary | ICD-10-CM

## 2022-12-04 DIAGNOSIS — M62838 Other muscle spasm: Secondary | ICD-10-CM

## 2022-12-04 MED ORDER — CYCLOBENZAPRINE HCL 10 MG PO TABS: 10.0000 mg | ORAL_TABLET | Freq: Three times a day (TID) | ORAL | 0 refills | Status: DC | PRN

## 2022-12-04 MED ORDER — CLOBETASOL PROPIONATE 0.05 % EX OINT: 1.0000 | TOPICAL_OINTMENT | Freq: Two times a day (BID) | CUTANEOUS | 0 refills | Status: AC

## 2022-12-04 MED ORDER — CLOTRIMAZOLE 1 % EX CREA: 1.0000 | TOPICAL_CREAM | Freq: Two times a day (BID) | CUTANEOUS | 0 refills | Status: AC

## 2022-12-04 MED ORDER — SHINGRIX 50 MCG/0.5ML IM SUSR: 0.5000 mL | INTRAMUSCULAR | 0 refills | Status: DC

## 2022-12-04 MED ORDER — TRULICITY 0.75 MG/0.5ML ~~LOC~~ SOAJ: 0.7500 mg | SUBCUTANEOUS | 1 refills | Status: DC

## 2022-12-04 MED ORDER — SHINGRIX 50 MCG/0.5ML IM SUSR: 0.5000 mL | INTRAMUSCULAR | 0 refills | Status: AC

## 2022-12-04 MED ORDER — NYSTATIN 100000 UNIT/GM EX POWD: 1.0000 | Freq: Three times a day (TID) | CUTANEOUS | 0 refills | Status: AC

## 2022-12-04 NOTE — Assessment & Plan Note (Signed)
Encouraged compliance with statin--she was not taking on last check Repeat LDL at follow up  Congratulated on making lifestyle changes

## 2022-12-04 NOTE — Telephone Encounter (Signed)
Pharmacy Patient Advocate Encounter   Received notification from Patient Advice Request messages that prior authorization for [employer]ICITY is required/requested.   Insurance verification completed.   The patient is insured through [employer] .   Per test claim: PA required; PA submitted to above mentioned insurance via CoverMyMeds Key/confirmation #/EOC [id number]. Status is pending

## 2022-12-04 NOTE — Assessment & Plan Note (Signed)
Near goal for A1C but desires weight loss Trulicity Rx--she will message about cost  Up to date on other items

## 2022-12-04 NOTE — Patient Instructions (Addendum)
It was wonderful to see you today.  Please bring ALL of your medications with you to every visit.   Today we talked about: -Congratulations with your blood pressure looks great we will repeat your kidney function today   Please continue taking all of your medications I sent in Trulicity will see the cost of this.  I will call you soon as your CAT scan of your lungs is done  I sent Trulicity to your pharmacy let me know how expensive this is  You are due for your A1c in December please schedule visit at the front  I have sent a prescription for Shingrix to your pharmacy you can get your second dose of the vaccine there  I have referred you to [facility] to further evaluate your concern. If you do not received a phone call about this appointment within 3-4 weeks, please call our office back at [phone]. Clemencia Course coordinates our referrals and can assist you in this.    Thank you for choosing [facility].   Please call [phone] with any questions about today's appointment.  Please be sure to schedule follow up at the front  desk before you leave today.   Terisa Starr, MD  Family Medicine

## 2022-12-04 NOTE — Assessment & Plan Note (Signed)
At goal BMP today

## 2022-12-04 NOTE — Addendum Note (Signed)
Addended by: Manson Passey, Janesia Joswick on: 12/04/2022 02:16 PM   Modules accepted: Orders

## 2022-12-05 LAB — BASIC METABOLIC PANEL
BUN/Creatinine Ratio: 25 — ABNORMAL HIGH (ref 9–23)
BUN: 14 mg/dL (ref 6–24)
CO2: 23 mmol/L (ref 20–29)
Calcium: 9.3 mg/dL (ref 8.7–10.2)
Chloride: 99 mmol/L (ref 96–106)
Creatinine, Ser: 0.57 mg/dL (ref 0.57–1.00)
Glucose: 293 mg/dL — ABNORMAL HIGH (ref 70–99)
Potassium: 5 mmol/L (ref 3.5–5.2)
Sodium: 138 mmol/L (ref 134–144)
eGFR: 105 mL/min/{1.73_m2} (ref >59)

## 2022-12-06 ENCOUNTER — Other Ambulatory Visit (HOSPITAL_COMMUNITY): Payer: Self-pay

## 2022-12-06 NOTE — Telephone Encounter (Signed)
Pharmacy Patient Advocate Encounter  Received notification from [employer] that Prior Authorization for [employer] has been APPROVED from 12/04/22 to 12/03/23   PA #/Case ID/Reference #:  [id number]

## 2022-12-13 ENCOUNTER — Telehealth: Payer: Self-pay | Admitting: Family Medicine

## 2022-12-13 DIAGNOSIS — E041 Nontoxic single thyroid nodule: Secondary | ICD-10-CM

## 2022-12-13 NOTE — Telephone Encounter (Signed)
Attempted to call patient. Reached voicemail, left generic voicemail to call back. If she calls back, please identify a good time to call CT scan looks good--has a thyroid nodule- this is VERY common, just needs an ultrasound   Terisa Starr, MD  [facility] Medicine Teaching Service

## 2022-12-14 LAB — HM MAMMOGRAPHY

## 2022-12-15 ENCOUNTER — Encounter: Payer: Self-pay | Admitting: Family Medicine

## 2022-12-15 NOTE — Telephone Encounter (Signed)
 Care team updated and letter sent for eye exam notes.

## 2022-12-18 ENCOUNTER — Telehealth: Payer: Self-pay | Admitting: Family Medicine

## 2022-12-18 ENCOUNTER — Other Ambulatory Visit (HOSPITAL_COMMUNITY): Payer: Self-pay

## 2022-12-18 NOTE — Telephone Encounter (Signed)
Called and discussed results.   [name], MD  Family Medicine Teaching Service

## 2022-12-21 ENCOUNTER — Telehealth: Payer: Self-pay | Admitting: Pharmacist

## 2022-12-21 NOTE — Telephone Encounter (Signed)
Patient contacted for follow-up of need for help with PA - Trulicity (dulaglutide)  Since last contact patient reports she believes the cost / PA issue has been resolved.  She plans to pick up medication tomorrow.   I asked her to call us again if needed.  She thanked me for the call.   Total time with patient call and documentation of interaction: 6 minutes.

## 2022-12-25 ENCOUNTER — Ambulatory Visit (HOSPITAL_COMMUNITY)
Admission: RE | Admit: 2022-12-25 | Discharge: 2022-12-25 | Disposition: A | Discharge status: Home / Self Care | Payer: Self-pay | Source: Ambulatory Visit | Attending: Family Medicine | Admitting: Family Medicine

## 2022-12-25 ENCOUNTER — Other Ambulatory Visit (HOSPITAL_COMMUNITY): Payer: Self-pay

## 2022-12-25 DIAGNOSIS — E041 Nontoxic single thyroid nodule: Secondary | ICD-10-CM | POA: Insufficient documentation

## 2022-12-26 ENCOUNTER — Encounter: Payer: Self-pay | Admitting: Family Medicine

## 2022-12-26 DIAGNOSIS — D369 Benign neoplasm, unspecified site: Secondary | ICD-10-CM | POA: Insufficient documentation

## 2023-01-01 ENCOUNTER — Encounter: Payer: Self-pay | Admitting: Family Medicine

## 2023-01-01 ENCOUNTER — Telehealth: Payer: Self-pay | Admitting: Family Medicine

## 2023-01-01 ENCOUNTER — Other Ambulatory Visit (HOSPITAL_COMMUNITY): Payer: Self-pay

## 2023-01-01 NOTE — Telephone Encounter (Signed)
Attempted to call patient. Reached voicemail, left generic voicemail to call back.  Sent mychart If patient calls back her thyroid ultrasound shows no nodules needing biopsy--just a slightly larger thyroid which is common. We will repeat TSH in 1 year  Terisa Starr, MD  [facility] Medicine Teaching Service

## 2023-01-04 NOTE — Progress Notes (Signed)
    SUBJECTIVE:   CHIEF COMPLAINT: diabetes, anxiety  HPI:   Alyssa Rangel is a 57 y.o.  with history notable for type 2 DM on GLP1  presenting for follow up.   Diabetes The patient's A1C is  Lab Results  Component Value Date   HGBA1C 9.1 (A) 01/05/2023   She reports she has been 'hitting the sweets'. No poyluria or polydipsia. Has not been taking GLP1 due to excessive cost. She is taking her statin and ARB.  Illness Has had cough, congestion, and phelgm since Monday. Using albuterol once in a while (4 puffs). No chest pain, dyspnea, edema, fevers. Uncle had same condition. 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 Using Robitussin.    HTN She did take her losartan today. Home BP within range. Reports she feels unwell due to URI symptoms today. No CP, dyspnea.   She reports her anxiety is under control. Not interested in medication at this time. Iwll message if she wants therapy.  She is amenable to Tdap.   Trulicity is 900  Sick since Monday (5 days)   PERTINENT  PMH / PSH/Family/Social History : obesity, diabetes, recent breast biopsy which was normal   OBJECTIVE:   BP (!) 144/62   Pulse 80   Wt 273 lb (123.8 kg)   SpO2 100%   BMI 46.86 kg/m   Today's weight:  Last Weight  Most recent update: 01/05/2023  2:10 PM    Weight  123.8 kg (273 lb)            Review of prior weights: [employer]   01/05/23 1407  Weight: 273 lb (123.8 kg)    HEENT  + nasal congestion, boggy turbinates  R TM with retraction  L is normal Oropharynx benign No adenopathy  Cardiac: Regular rate and rhythm. Normal S1/S2. No murmurs, rubs, or gallops appreciated. Lungs: Clear bilaterally to ascultation.  Abdomen: Normoactive bowel sounds. No tenderness to deep or light palpation. No rebound or guarding.    Psych: Pleasant and appropriate    ASSESSMENT/PLAN:   Assessment & Plan Type 2 diabetes mellitus without complication, without long-term current use of insulin  (HCC) Not at goal Slowly titrate up metformin to max dose Messaged Camille--will check cost of Trulicity [facility] Follow up in 1 month Discussed dietary changes  Anxiety state Discussed, she is doing well at this time Need for Tdap vaccination Administered today Trapezius muscle spasm Refilled flexeril  Mild intermittent asthma without complication Refilled albuterol Thyroid enlarged Discussed results Repeat TSH and ultrasound 1 year No nodules identified for biopsy  Primary hypertension Will check home readings May need combination pill  Viral URI Onset >5 days ago Outside treatment window for flu/Covid Discussed supportive measures Patient does develop sinus infections--would Rx Augmentin if symptoms not improved by end of next week   Terisa Starr, MD  Family Medicine Teaching Service  [facility]

## 2023-01-05 ENCOUNTER — Other Ambulatory Visit (HOSPITAL_COMMUNITY): Payer: Self-pay

## 2023-01-05 ENCOUNTER — Encounter: Payer: Self-pay | Admitting: Family Medicine

## 2023-01-05 ENCOUNTER — Ambulatory Visit (INDEPENDENT_AMBULATORY_CARE_PROVIDER_SITE_OTHER): Payer: No Typology Code available for payment source | Admitting: Family Medicine

## 2023-01-05 VITALS — BP 144/62 | HR 80 | Wt 273.0 lb

## 2023-01-05 DIAGNOSIS — Z23 Encounter for immunization: Secondary | ICD-10-CM

## 2023-01-05 DIAGNOSIS — J4520 Mild intermittent asthma, uncomplicated: Secondary | ICD-10-CM

## 2023-01-05 DIAGNOSIS — M62838 Other muscle spasm: Secondary | ICD-10-CM | POA: Diagnosis not present

## 2023-01-05 DIAGNOSIS — Z7984 Long term (current) use of oral hypoglycemic drugs: Secondary | ICD-10-CM

## 2023-01-05 DIAGNOSIS — E119 Type 2 diabetes mellitus without complications: Secondary | ICD-10-CM | POA: Diagnosis not present

## 2023-01-05 DIAGNOSIS — I10 Essential (primary) hypertension: Secondary | ICD-10-CM

## 2023-01-05 DIAGNOSIS — F411 Generalized anxiety disorder: Secondary | ICD-10-CM | POA: Diagnosis not present

## 2023-01-05 DIAGNOSIS — E049 Nontoxic goiter, unspecified: Secondary | ICD-10-CM | POA: Insufficient documentation

## 2023-01-05 LAB — POCT GLYCOSYLATED HEMOGLOBIN (HGB A1C): HbA1c, POC (controlled diabetic range): 9.1 % — AB (ref 0.0–7.0)

## 2023-01-05 MED ORDER — TRULICITY 0.75 MG/0.5ML ~~LOC~~ SOAJ: 0.7500 mg | SUBCUTANEOUS | 0 refills | Status: DC

## 2023-01-05 MED ORDER — LIRAGLUTIDE 18 MG/3ML ~~LOC~~ SOPN: 0.6000 mg | PEN_INJECTOR | Freq: Every day | SUBCUTANEOUS | 0 refills | Status: DC

## 2023-01-05 MED ORDER — CYCLOBENZAPRINE HCL 10 MG PO TABS: 10.0000 mg | ORAL_TABLET | Freq: Three times a day (TID) | ORAL | 0 refills | Status: DC | PRN

## 2023-01-05 MED ORDER — ALBUTEROL SULFATE HFA 108 (90 BASE) MCG/ACT IN AERS: 2.0000 | INHALATION_SPRAY | Freq: Four times a day (QID) | RESPIRATORY_TRACT | 0 refills | Status: DC | PRN

## 2023-01-05 MED ORDER — METFORMIN HCL ER 500 MG PO TB24: 1000.0000 mg | ORAL_TABLET | Freq: Two times a day (BID) | ORAL | 3 refills | Status: DC

## 2023-01-05 MED ORDER — FLUTICASONE PROPIONATE 50 MCG/ACT NA SUSP: 2.0000 | Freq: Every day | NASAL | 6 refills | Status: DC

## 2023-01-05 NOTE — Assessment & Plan Note (Addendum)
Discussed results Repeat TSH and ultrasound 1 year No nodules identified for biopsy

## 2023-01-05 NOTE — Patient Instructions (Addendum)
It was wonderful to see you today.  Please bring ALL of your medications with you to every visit.   Today we talked about:   Increase your metformin to 1 tablet twice a day  The week of January 6th please take 2 tablets of metformin in the morning and 1 at night  The week of January 13th please take 2 tables twice a day   We will repeat your thyroid test and ultrasound in 1 year   For your virus - Please push fluids  - Use albuterol 2 puffs every 4 -6 hours  - Call if your symptoms are not improved in the [place] Year   I call your pharmacy---I will reach out to our pharmacy team again about the cost of the Trulicity--that is way too pricey   I sent in an alternative--this is once a day  It causes weight loss It will help with your diabetes  Let's see the cost of this     Please follow up in 1 months for diabetes   Thank you for choosing [facility].   Please call [phone] with any questions about today's appointment.  Please be sure to schedule follow up at the front  desk before you leave today.   Terisa Starr, MD  Family Medicine

## 2023-01-05 NOTE — Assessment & Plan Note (Addendum)
Will check home readings May need combination pill

## 2023-01-05 NOTE — Assessment & Plan Note (Signed)
Not at goal Slowly titrate up metformin to max dose Messaged Camille--will check cost of Trulicity [facility] Follow up in 1 month Discussed dietary changes

## 2023-01-18 LAB — HM DIABETES EYE EXAM

## 2023-01-31 NOTE — Telephone Encounter (Signed)
Attempted to call if she calls back please recommend she be seen   Attempted to call patient. Reached voicemail, left generic voicemail to call back.   Terisa Starr, MD  Family Medicine Teaching Service

## 2023-02-02 ENCOUNTER — Other Ambulatory Visit: Payer: Self-pay

## 2023-02-02 DIAGNOSIS — J4520 Mild intermittent asthma, uncomplicated: Secondary | ICD-10-CM

## 2023-02-02 MED ORDER — ALBUTEROL SULFATE HFA 108 (90 BASE) MCG/ACT IN AERS: 2.0000 | INHALATION_SPRAY | Freq: Four times a day (QID) | RESPIRATORY_TRACT | 0 refills | Status: DC | PRN

## 2023-02-02 NOTE — Progress Notes (Unsigned)
    SUBJECTIVE:   CHIEF COMPLAINT: medication titration  HPI:   Alyssa Rangel is a 58 y.o.  with history notable for type 2 DM  presenting for follow up.   Diabetes She has not yet started Trulicity. Her weight is increased. She is going to pick it up and see how she does related to appetite/weight. Did have recent bout of norovirus last week but feels improved today. .  Cough Reports >4 weeks of phlegm, congestion. Started over Christmas then flu broke out in nursing facility. Former smoker, but uncle (with whom she lives) still smokes. Reports feeling feverish, intermittent dyspnea. No hemoptysis, recorded fevers, nausea, vomiting, chest pain. Has been using albuterol. Had pulmonary enlargement on CT.   PERTINENT  PMH / PSH/Family/Social History : Obesity   OBJECTIVE:   BP (!) 159/60   Pulse 80   Ht 5\' 4"  (1.626 m)   Wt 275 lb (124.7 kg)   SpO2 97%   BMI 47.20 kg/m   Today's weight:  Last Weight  Most recent update: 02/05/2023 10:03 AM    Weight  124.7 kg (275 lb)            Review of prior weights  HEENT: EOMI. Sclera without injection or icterus. MMM. External auditory canal examined and WNL. TM normal appearance, no erythema or bulging. Neck: Acanthosis .  Cardiac: Regular rate and rhythm. Normal S1/S2. No murmurs, rubs, or gallops appreciated. Lungs: Clear bilaterally to ascultation.    ASSESSMENT/PLAN:   Assessment & Plan Type 2 diabetes mellitus without complication, without long-term current use of insulin (HCC) A1C not at goal She will pick up Trulicity Increase to 1.5 mg dose at next visit  Persistent cough Ddx includes post viral, pneumonia, related to restrictive lung findings (2022), possible HF given HTN, dyspnea Echocardiogram, chest x-ray and CBC  Flonase refilled At follow up discuss Pulmonary--referred in 2022  Primary hypertension Not at goal Measure BP twice weekly  She will message with values Change to combination if  persistently elevated Follow up 4 weeks  Trapezius muscle spasm Refilled flexeril   At follow up, consider Pulmonary referral  Terisa Starr, MD  Family Medicine Teaching Service  [facility]

## 2023-02-05 ENCOUNTER — Ambulatory Visit (INDEPENDENT_AMBULATORY_CARE_PROVIDER_SITE_OTHER): Payer: No Typology Code available for payment source | Admitting: Family Medicine

## 2023-02-05 ENCOUNTER — Encounter: Payer: Self-pay | Admitting: Family Medicine

## 2023-02-05 VITALS — BP 159/60 | HR 80 | Ht 64.0 in | Wt 275.0 lb

## 2023-02-05 DIAGNOSIS — I10 Essential (primary) hypertension: Secondary | ICD-10-CM

## 2023-02-05 DIAGNOSIS — R053 Chronic cough: Secondary | ICD-10-CM

## 2023-02-05 DIAGNOSIS — M62838 Other muscle spasm: Secondary | ICD-10-CM

## 2023-02-05 DIAGNOSIS — Z7985 Long-term (current) use of injectable non-insulin antidiabetic drugs: Secondary | ICD-10-CM | POA: Diagnosis not present

## 2023-02-05 DIAGNOSIS — E119 Type 2 diabetes mellitus without complications: Secondary | ICD-10-CM

## 2023-02-05 MED ORDER — FLUTICASONE PROPIONATE 50 MCG/ACT NA SUSP: 2.0000 | Freq: Every day | NASAL | 6 refills | Status: DC

## 2023-02-05 MED ORDER — CYCLOBENZAPRINE HCL 10 MG PO TABS: 10.0000 mg | ORAL_TABLET | Freq: Three times a day (TID) | ORAL | 2 refills | Status: DC | PRN

## 2023-02-05 MED ORDER — LOSARTAN POTASSIUM 50 MG PO TABS: 50.0000 mg | ORAL_TABLET | Freq: Every day | ORAL | 3 refills | Status: DC

## 2023-02-05 NOTE — Assessment & Plan Note (Signed)
Not at goal Measure BP twice weekly  She will message with values Change to combination if persistently elevated Follow up 4 weeks

## 2023-02-05 NOTE — Assessment & Plan Note (Signed)
A1C not at goal She will pick up Trulicity Increase to 1.5 mg dose at next visit

## 2023-02-05 NOTE — Patient Instructions (Signed)
It was wonderful to see you today.  Please bring ALL of your medications with you to every visit.   Today we talked about:  ** Check your blood pressure at home 2 times a week  Send me values from the morning before you have coffee  Please pick up your trulicity  Follow up in 1 month or 6 weeks to titrate  You will be called about a heart ultrasound--this is to assess your blood vessels after some years of smoking   An x-ray was ordered for you---you do not need an appointment to have this completed.  I recommend going to [facility] [address] [place]  If the results are normal,I will send you a letter  I will call you with results if anything is abnormal     Please follow up in 1 months   Thank you for choosing [facility].   Please call [phone] with any questions about today's appointment.  Please be sure to schedule follow up at the front  desk before you leave today.   Terisa Starr, MD  Family Medicine

## 2023-02-06 ENCOUNTER — Encounter: Payer: Self-pay | Admitting: Family Medicine

## 2023-02-06 LAB — CBC
Hematocrit: 38.9 % (ref 34.0–46.6)
Hemoglobin: 12.4 g/dL (ref 11.1–15.9)
MCH: 27.1 pg (ref 26.6–33.0)
MCHC: 31.9 g/dL (ref 31.5–35.7)
MCV: 85 fL (ref 79–97)
Platelets: 294 10*3/uL (ref 150–450)
RBC: 4.57 x10E6/uL (ref 3.77–5.28)
RDW: 12.7 % (ref 11.7–15.4)
WBC: 7.4 10*3/uL (ref 3.4–10.8)

## 2023-02-12 ENCOUNTER — Ambulatory Visit
Admission: RE | Admit: 2023-02-12 | Discharge: 2023-02-12 | Disposition: A | Discharge status: Home / Self Care | Payer: No Typology Code available for payment source | Source: Ambulatory Visit | Attending: Family Medicine

## 2023-02-12 DIAGNOSIS — R053 Chronic cough: Secondary | ICD-10-CM

## 2023-02-15 ENCOUNTER — Encounter: Payer: Self-pay | Admitting: Family Medicine

## 2023-02-15 DIAGNOSIS — Z87891 Personal history of nicotine dependence: Secondary | ICD-10-CM

## 2023-02-15 DIAGNOSIS — R053 Chronic cough: Secondary | ICD-10-CM

## 2023-02-15 NOTE — Telephone Encounter (Signed)
Will discuss echo at follow up

## 2023-02-16 MED ORDER — BUDESONIDE-FORMOTEROL FUMARATE 160-4.5 MCG/ACT IN AERO: 2.0000 | INHALATION_SPRAY | Freq: Two times a day (BID) | RESPIRATORY_TRACT | 3 refills | Status: DC

## 2023-02-16 NOTE — Telephone Encounter (Signed)
 Called patient. Has a history of questionable asthma previously on Symbicort . Sent in controller inhaler. She will stop other OTC medications. No dyspnea or chest pain. She will message if not improved.  Suzann Daring, MD  Family Medicine Teaching Service

## 2023-02-16 NOTE — Addendum Note (Signed)
 Addended by: Bevin Bucks, Morgana Rowley on: 02/16/2023 11:52 AM   Modules accepted: Orders

## 2023-02-27 ENCOUNTER — Other Ambulatory Visit: Payer: Self-pay | Admitting: *Deleted

## 2023-02-27 DIAGNOSIS — J4520 Mild intermittent asthma, uncomplicated: Secondary | ICD-10-CM

## 2023-02-27 MED ORDER — ALBUTEROL SULFATE HFA 108 (90 BASE) MCG/ACT IN AERS: 2.0000 | INHALATION_SPRAY | Freq: Four times a day (QID) | RESPIRATORY_TRACT | 0 refills | Status: DC | PRN

## 2023-03-08 NOTE — Progress Notes (Unsigned)
    SUBJECTIVE:   CHIEF COMPLAINT: BP and glucose HPI:   Alyssa Rangel is a 58 y.o.  with history notable for type 2 DM, HTN, and prior tobacco use presenting for follow up on cougha and HTN.   PERTINENT  PMH / PSH/Family/Social History : ***  OBJECTIVE:   There were no vitals taken for this visit.  Today's weight:  Review of prior weights: There were no vitals filed for this visit.   Cardiac: Regular rate and rhythm. Normal S1/S2. No murmurs, rubs, or gallops appreciated. Lungs: Clear bilaterally to ascultation.  Abdomen: Normoactive bowel sounds. No tenderness to deep or light palpation. No rebound or guarding.  ***  Psych: Pleasant and appropriate    ASSESSMENT/PLAN:   Assessment & Plan Primary hypertension  Type 2 diabetes mellitus without complication, without long-term current use of insulin (HCC)    Terisa Starr, MD  Family Medicine Teaching Service  [facility]

## 2023-03-09 ENCOUNTER — Encounter: Payer: Self-pay | Admitting: Family Medicine

## 2023-03-09 ENCOUNTER — Other Ambulatory Visit (HOSPITAL_COMMUNITY): Payer: Self-pay

## 2023-03-09 ENCOUNTER — Ambulatory Visit (INDEPENDENT_AMBULATORY_CARE_PROVIDER_SITE_OTHER): Payer: No Typology Code available for payment source | Admitting: Family Medicine

## 2023-03-09 VITALS — BP 151/70 | HR 78 | Ht 64.0 in | Wt 275.6 lb

## 2023-03-09 DIAGNOSIS — I10 Essential (primary) hypertension: Secondary | ICD-10-CM

## 2023-03-09 DIAGNOSIS — R519 Headache, unspecified: Secondary | ICD-10-CM

## 2023-03-09 DIAGNOSIS — E119 Type 2 diabetes mellitus without complications: Secondary | ICD-10-CM

## 2023-03-09 DIAGNOSIS — M62838 Other muscle spasm: Secondary | ICD-10-CM | POA: Diagnosis not present

## 2023-03-09 DIAGNOSIS — Z7985 Long-term (current) use of injectable non-insulin antidiabetic drugs: Secondary | ICD-10-CM

## 2023-03-09 DIAGNOSIS — R040 Epistaxis: Secondary | ICD-10-CM

## 2023-03-09 DIAGNOSIS — J4520 Mild intermittent asthma, uncomplicated: Secondary | ICD-10-CM | POA: Diagnosis not present

## 2023-03-09 MED ORDER — ALBUTEROL SULFATE HFA 108 (90 BASE) MCG/ACT IN AERS: 2.0000 | INHALATION_SPRAY | Freq: Four times a day (QID) | RESPIRATORY_TRACT | 3 refills | Status: DC | PRN

## 2023-03-09 MED ORDER — LOSARTAN POTASSIUM-HCTZ 50-12.5 MG PO TABS: 1.0000 | ORAL_TABLET | Freq: Every day | ORAL | 1 refills | Status: DC

## 2023-03-09 MED ORDER — CYCLOBENZAPRINE HCL 10 MG PO TABS: 10.0000 mg | ORAL_TABLET | Freq: Three times a day (TID) | ORAL | 2 refills | Status: DC | PRN

## 2023-03-09 MED ORDER — FEXOFENADINE HCL 60 MG PO TABS: 60.0000 mg | ORAL_TABLET | Freq: Two times a day (BID) | ORAL | 0 refills | Status: AC

## 2023-03-09 MED ORDER — SEMAGLUTIDE(0.25 OR 0.5MG/DOS) 2 MG/3ML ~~LOC~~ SOPN
0.2500 mg | PEN_INJECTOR | SUBCUTANEOUS | 1 refills | Status: DC
  Filled 2023-03-09 – 2023-04-06 (×2): qty 3, 28d supply, fill #0

## 2023-03-09 NOTE — Assessment & Plan Note (Signed)
 A1C not at goal Intolerant to Trulicity  Trial of Ozempic to see if she tolerates

## 2023-03-09 NOTE — Patient Instructions (Addendum)
 It was wonderful to see you today.  Please bring ALL of your medications with you to every visit.   Today we talked about:  NOSE BLEEDS STOP flonase for 2 weeks Humidifer in room I sent in Allegra instead   HEADACHES Likely related to change in season/allergies I sent in Allegra to take twice a day  Diabetes You can take the trulicity until Ozempic arrives--this is at [facility]  We ordered and scheduled a heart ultrasound   Your blood pressure has been high STOP losartan Start Hyzaar Let me know if to expensive Follow up in 3-4 weeks    I have referred you to [facility] to further evaluate your concern. If you do not received a phone call about this appointment within 3-4 weeks, please call our office back at [phone]. Clemencia Course coordinates our referrals and can assist you in this.    Please follow up in 1 months for A1C  Thank you for choosing [facility].   Please call [phone] with any questions about today's appointment.  Please be sure to schedule follow up at the front  desk before you leave today.   Terisa Starr, MD  Family Medicine

## 2023-03-09 NOTE — Assessment & Plan Note (Signed)
 Not at goal STOP losartan START HYZAAR  Repeat BMP in 2-3 weeks

## 2023-03-20 ENCOUNTER — Encounter: Payer: Self-pay | Admitting: Family Medicine

## 2023-03-20 ENCOUNTER — Ambulatory Visit (HOSPITAL_COMMUNITY)
Admission: RE | Admit: 2023-03-20 | Discharge: 2023-03-20 | Disposition: A | Discharge status: Home / Self Care | Payer: Self-pay | Source: Ambulatory Visit | Attending: Family Medicine | Admitting: Family Medicine

## 2023-03-20 DIAGNOSIS — I272 Pulmonary hypertension, unspecified: Secondary | ICD-10-CM | POA: Diagnosis not present

## 2023-03-20 DIAGNOSIS — I10 Essential (primary) hypertension: Secondary | ICD-10-CM

## 2023-03-20 DIAGNOSIS — I119 Hypertensive heart disease without heart failure: Secondary | ICD-10-CM | POA: Diagnosis not present

## 2023-03-20 LAB — ECHOCARDIOGRAM COMPLETE
Area-P 1/2: 1.77 cm2
S' Lateral: 3.3 cm

## 2023-04-05 NOTE — Progress Notes (Unsigned)
    SUBJECTIVE:   CHIEF COMPLAINT: type 2 DM, BP  HPI:   Alyssa Rangel is a 58 y.o.  with history notable for type 2 DM and HTN presenting for follow up.  Cough/Breathing Reports albuterol use intermittently. Cough that was present for several months no mostly gone. Still occurs at night. CT and chest x-ray showed no primary lung pathology. Echo and CT suggestive of pulmonary HTN. Has had spirometry showing possible restriction. She has been referred to Pulmonary, plans to call them.   Diabetes A1C 9.2%. She reports she is taking metformin 2 pills once per day.  She was not called about Ozempic. She reports she eats very little but is drinking 3 large cans (16 ounces) of ginger ale a day. Working on getting outside more. She is on a statin.  HTN Tolerating Hyzaar well. Did not tolerate amlodipine due to swelling. Has home BP that are in 130-150. Interestingly, R arm is always higher than left by up to 10-15 mmHg.   PERTINENT  PMH / PSH/Family/Social History : Type 2 DM, not at goal   OBJECTIVE:   BP (!) 153/86 Comment: right arm  Pulse 63   Ht 5\' 4"  (1.626 m)   Wt 270 lb (122.5 kg)   SpO2 98%   BMI 46.35 kg/m   Today's weight:  Last Weight  Most recent update: 04/06/2023 10:04 AM    Weight  122.5 kg (270 lb)            Review of prior weights: [employer]   04/06/23 1002  Weight: 270 lb (122.5 kg)   RRR Lungs clear + acanthosis Bilateral TM improved Oropharynx benign   ASSESSMENT/PLAN:   Assessment & Plan Primary hypertension Not at goal Increase to losartan hydrochlorothiazide 100 25 Follow up with Dr. Raymondo Band Please obtain BMP at follow up BMP today  Consider spironolactone as add on (doesn't tolerate amlodipine)  CMA has called the pharmacy to cancel the lower dose prescription. Type 2 diabetes mellitus without complication, without long-term current use of insulin (HCC) Not at goal Personally called pharmacy INCREASE metformin to 2,000 mg  total per day Start oxempic Reminder to self to ensure PA started/done  Chronic cough Also with evidence of possible pulmonary hypertension.  Referred to pulmonary at last visit.  The number was provided today.   Terisa Starr, MD  Family Medicine Teaching Service  [facility]

## 2023-04-06 ENCOUNTER — Other Ambulatory Visit (HOSPITAL_COMMUNITY): Payer: Self-pay

## 2023-04-06 ENCOUNTER — Telehealth: Payer: Self-pay

## 2023-04-06 ENCOUNTER — Encounter: Payer: Self-pay | Admitting: Family Medicine

## 2023-04-06 ENCOUNTER — Ambulatory Visit: Payer: No Typology Code available for payment source | Admitting: Family Medicine

## 2023-04-06 VITALS — BP 153/86 | HR 63 | Ht 64.0 in | Wt 270.0 lb

## 2023-04-06 DIAGNOSIS — R053 Chronic cough: Secondary | ICD-10-CM

## 2023-04-06 DIAGNOSIS — E119 Type 2 diabetes mellitus without complications: Secondary | ICD-10-CM | POA: Diagnosis not present

## 2023-04-06 DIAGNOSIS — I10 Essential (primary) hypertension: Secondary | ICD-10-CM

## 2023-04-06 LAB — POCT GLYCOSYLATED HEMOGLOBIN (HGB A1C): HbA1c, POC (controlled diabetic range): 9.2 % — AB (ref 0.0–7.0)

## 2023-04-06 MED ORDER — LOSARTAN POTASSIUM-HCTZ 100-25 MG PO TABS: 1.0000 | ORAL_TABLET | Freq: Every day | ORAL | 3 refills | Status: DC

## 2023-04-06 NOTE — Assessment & Plan Note (Signed)
 Not at goal Personally called pharmacy INCREASE metformin to 2,000 mg total per day Start oxempic Reminder to self to ensure PA started/done

## 2023-04-06 NOTE — Patient Instructions (Addendum)
 It was wonderful to see you today.  Please bring ALL of your medications with you to every visit.   Today we talked about:  -- The pharmacy team is working on getting you Ozempic--I will call or message you next week when ready  -- Increase your metformin to 2 tablets TWICE per day   --Follow up in 2 weeks with Dr. Raymondo Band for blood pressure  Call the lung doctors  Referral sent to: [facility]  [address] [place], [place] [address] [phone]  Follow up with me in 1 month   Please follow up in 2 weeks   Thank you for choosing [facility].   Please call [phone] with any questions about today's appointment.  Please be sure to schedule follow up at the front  desk before you leave today.   Terisa Starr, MD  Family Medicine

## 2023-04-06 NOTE — Assessment & Plan Note (Addendum)
 Not at goal Increase to losartan hydrochlorothiazide 100 25 Follow up with Dr. Raymondo Band Please obtain BMP at follow up BMP today  Consider spironolactone as add on (doesn't tolerate amlodipine)  CMA has called the pharmacy to cancel the lower dose prescription.

## 2023-04-06 NOTE — Telephone Encounter (Signed)
 Pharmacy Patient Advocate Encounter  Received notification from [facility] / [employer] that Prior Authorization for [facility] has been DENIED.  Full denial letter will be uploaded to the media tab. See denial reason below.  Your plan only covers this product in the dosage form you asked for when you are unable to take a preferred product in a different dosage form. There is no medical reason that you are required to take this product in this dosage form. For your plan, you may need to try up to three preferred products. We have denied your request because you do not meet any of these conditions. We reviewed the information we had. Your request has been denied. Your doctor can send [place] any new or missing information for [place] to review. The preferred products for your plan are: Rybelsus; Trulicity; Victoza/liraglutide.

## 2023-04-06 NOTE — Telephone Encounter (Signed)
 Hi Camille--see my February note-the patient had significant side effects with Trulicity--do we have to fail all 3?

## 2023-04-06 NOTE — Telephone Encounter (Signed)
 Pharmacy Patient Advocate Encounter   Received notification from CoverMyMeds that prior authorization for [facility] is required/requested.   Insurance verification completed.   The patient is insured through [facility] .   PA required; PA submitted to above mentioned insurance via CoverMyMeds Key/confirmation #/EOC [employer]. Status is pending

## 2023-04-07 LAB — BASIC METABOLIC PANEL WITH GFR
BUN/Creatinine Ratio: 20 (ref 9–23)
BUN: 13 mg/dL (ref 6–24)
CO2: 23 mmol/L (ref 20–29)
Calcium: 9.8 mg/dL (ref 8.7–10.2)
Chloride: 100 mmol/L (ref 96–106)
Creatinine, Ser: 0.65 mg/dL (ref 0.57–1.00)
Glucose: 181 mg/dL — ABNORMAL HIGH (ref 70–99)
Potassium: 4.9 mmol/L (ref 3.5–5.2)
Sodium: 142 mmol/L (ref 134–144)
eGFR: 101 mL/min/{1.73_m2} (ref >59)

## 2023-04-10 ENCOUNTER — Telehealth: Payer: Self-pay | Admitting: Family Medicine

## 2023-04-10 MED ORDER — SEMAGLUTIDE 3 MG PO TABS: 3.0000 mg | ORAL_TABLET | Freq: Every day | ORAL | 1 refills | Status: DC

## 2023-04-10 NOTE — Telephone Encounter (Signed)
 Called with results. Must fail trulicity (had nausea), PO semaglutide and Victoza before Ozempic approved. Trial PO semaglutide.  Terisa Starr, MD  Family Medicine Teaching Service

## 2023-04-11 ENCOUNTER — Other Ambulatory Visit (HOSPITAL_COMMUNITY): Payer: Self-pay

## 2023-04-11 ENCOUNTER — Telehealth: Payer: Self-pay

## 2023-04-11 NOTE — Telephone Encounter (Signed)
 Pharmacy Patient Advocate Encounter   Received notification from Patient Advice Request messages that prior authorization for RYBELSUS 3MG  is required/requested.   Insurance verification completed.   The patient is insured through [facility] .   PA required; PA started via CoverMyMeds. KEY [id number] . Waiting for clinical questions to populate.

## 2023-04-12 NOTE — Telephone Encounter (Signed)
 Pharmacy Patient Advocate Encounter   PA required; PA submitted to above mentioned insurance via CoverMyMeds Key/confirmation #/[id number]. Status is pending

## 2023-04-16 ENCOUNTER — Other Ambulatory Visit (HOSPITAL_COMMUNITY): Payer: Self-pay

## 2023-04-16 NOTE — Telephone Encounter (Signed)
 PA request closed by [employer].  Attempted test claim on patients insurance, results show patients insurance is no longer active.

## 2023-04-19 ENCOUNTER — Encounter: Payer: Self-pay | Admitting: Pharmacist

## 2023-04-19 ENCOUNTER — Ambulatory Visit: Admitting: Pharmacist

## 2023-04-19 VITALS — BP 141/60 | HR 75 | Wt 268.8 lb

## 2023-04-19 DIAGNOSIS — I10 Essential (primary) hypertension: Secondary | ICD-10-CM

## 2023-04-19 DIAGNOSIS — Z7985 Long-term (current) use of injectable non-insulin antidiabetic drugs: Secondary | ICD-10-CM

## 2023-04-19 DIAGNOSIS — E119 Type 2 diabetes mellitus without complications: Secondary | ICD-10-CM

## 2023-04-19 LAB — POCT GLYCOSYLATED HEMOGLOBIN (HGB A1C): HbA1c, POC (controlled diabetic range): 9.2 % — AB (ref 0.0–7.0)

## 2023-04-19 MED ORDER — SPIRONOLACTONE 25 MG PO TABS: 12.5000 mg | ORAL_TABLET | Freq: Every day | ORAL | 3 refills | Status: DC

## 2023-04-19 NOTE — Progress Notes (Signed)
 S:     Chief Complaint  Patient presents with   Diabetes   58 y.o. female who presents for diabetes evaluation, education, and management in the context of the LIBERATE Study.   PMH is significant for T2DM, chronic pain, HLD, HTN, .  Patient was referred and last seen by Primary Care Provider, Dr. Manson Passey, on 04/06/23.   Today, patient arrives in  good spirits and presents without  any assistance.  Current diabetes medications include: metformin - Reported not yet starting Rybelsus (semaglutide) due to cost Current hypertension medications include: losartan-HCTZ Current hyperlipidemia medications include: rosuvastatin  Patient reports adherence to taking all medications as prescribed.   Patient denies hypoglycemic events.  Patient reported dietary habits: Eats 2 meals/day Drinks: Water, sweet wine, diet/zero sugar soda  O:    Lab Results  Component Value Date   HGBA1C 9.2 (A) 04/19/2023    Vitals:   04/19/23 1009  BP: (!) 141/60  Pulse: 75  SpO2: 95%    Lipid Panel     Component Value Date/Time   CHOL 272 (H) 09/25/2022 1206   TRIG 204 (H) 09/25/2022 1206   HDL 32 (L) 09/25/2022 1206   CHOLHDL 8.5 (H) 09/25/2022 1206   CHOLHDL 5.5 09/06/2012 1027   VLDL 19 09/06/2012 1027   LDLCALC 200 (H) 09/25/2022 1206   LDLDIRECT 205 (H) 11/06/2022 1233    Clinical Atherosclerotic Cardiovascular Disease (ASCVD):  The 10-year ASCVD risk score (Arnett DK, et al., 2019) is: 33.6%   Values used to calculate the score:     Age: 46 years     Sex: Female     Is Non-Hispanic African American: Yes     Diabetic: Yes     Tobacco smoker: No     Systolic Blood Pressure: 141 mmHg     Is BP treated: Yes     HDL Cholesterol: 32 mg/dL     Total Cholesterol: 272 mg/dL     A/P:  LIBERATE Study:  -Patient provided verbal consent to participate in the study. Consent documented in electronic medical record.  -Provided education on Libre 3 CGM. Collaborated to ensure Josephine Igo 3  app was downloaded on patient's phone. Educated on how to place sensor every 14 days, patient placed first sensor correctly and verbalized understanding of use, removal, and how to place next sensor. Discussed alarms. 8 sensors provided for a 3 month supply. Educated to contact the office if the sensor falls off early and replacements are needed before their next [employer].  Majority of visit focused on initiation of Libre 3 CGM sensor.    Diabetes longstanding uncontrolled with an A1c of 9.2%. Patient is able to verbalize appropriate hypoglycemia management plan. Medication adherence appears to be adherent. Control is suboptimal due to mixture of dietary and medication factors. -Continued metformin 1000mg  BID -Extensively discussed pathophysiology of diabetes, recommended lifestyle interventions, dietary effects on blood sugar control.  -Counseled on s/sx of and management of hypoglycemia.  -Next A1c anticipated in July 2025.   ASCVD risk - primary prevention in patient with diabetes. Last LDL is 200 not at goal of <14 mg/dL. ASCVD risk factors include HTN, T2DM and HLD and 10-year ASCVD risk score of 33.6%. High intensity statin indicated.  -Continued Rosuvastatin 20 mg -Recheck Direct LDL with future lab draw to determine need for combination therapy.  Hypertension longstanding currently uncontrolled 141/60 on losartan/hydrochlorothiazide 100/25mg  daily. Blood pressure goal of <130/80 mmHg. Medication adherence is good. Blood pressure control is suboptimal due to  needing additional medication control. Denies significant dizziness.  - Continued losartan/hydrochlorothiazide 100/25mg  daily. - Started spironolactone 12.5 mg daily   Written patient instructions provided. Patient verbalized understanding of treatment plan.  Total time in face to face counseling 60 minutes.    Follow-up:  Pharmacist May 03, 2023. PCP clinic visit in May.  Patient seen with Threasa Heads, PharmD  Candidate and Darolyn Rua, PharmD Candidate.

## 2023-04-19 NOTE — Patient Instructions (Signed)
 It was nice to see you today!  Your goal blood sugar is 80-130 before eating and less than 180 after eating.  Medication Changes: START spironolactone 12.5 mg daily   Continue all other medication the same.   Monitor blood sugars at home and keep a log (glucometer or piece of paper) to bring with you to your next visit.  Keep up the good work with diet and exercise. Aim for a diet full of vegetables, fruit and lean meats (chicken, [place], fish). Try to limit salt intake by eating fresh or frozen vegetables (instead of canned), rinse canned vegetables prior to cooking and do not add any additional salt to meals.     Sensor Application If using the App, you can tap Help in the Main Menu to access an in-app tutorial on applying a Sensor. See below for instructions on how to download the app. Apply Sensors only on the back of your upper arm. If placed in other areas, the Sensor may not function properly and could give you inaccurate readings. Avoid areas with scars, moles, stretch marks, or lumps.   Select an area of skin that generally stays flat during your normal daily activities (no bending or folding). Choose a site that is at least 1 inch (2.5 cm) away from any injection sites. To prevent discomfort or skin irritation, you should select a different site other than the one most recently used. Wash application site using a plain soap, dry, and then clean with an alcohol wipe. This will help remove any oily residue that may prevent the sensor from sticking properly. Allow site to air dry before proceeding. Note: The area MUST be clean and dry, or the Sensor may not stay on for the full wear duration specified by your Sensor insert. 4. Unscrew the cap from the Sensor Applicator and set the cap aside.  5. Place the Sensor Applicator over the prepared site and push down firmly to apply the Sensor to your body. 6. Gently pull the Sensor Applicator away from your body. The Sensor should now be  attached to your skin. 7. Make sure the Sensor is secure after application. Put the cap back on the Sensor Applicator. Discard the used Engineer, agricultural according to local regulations.  What If My Sensor Falls Off or What If My Sensor Isn't Working? Call [employer] Customer Care Team at [phone] Available 7 days a week from 8AM-8PM EST, excluding holidays If yo have multiple sensors fall off prior to 14 days of use, contact [facility] at [phone]   The App Download the [employer] 3 App in your phone's app store   Load the app and select get started now Create an account  Tap scan new sensor Follow the prompts on the screen. If your sensor does not sync, try moving your phone slowly around the sensor. Phone cases may affect scanning. This will be the only time you have to scan the sensor until you apply a new sensor in 14 days.  There will be a 60 minute start up period until the app will display your glucose reading   How To Share Your Readings With [place] Once in the app, go to settings -> connected apps -> LibreView -> Enter Practice ID -> [id number]

## 2023-04-19 NOTE — Assessment & Plan Note (Addendum)
 Hypertension longstanding currently uncontrolled 141/60 on losartan/hydrochlorothiazide 100/25mg  daily. Blood pressure goal of <130/80 mmHg. Medication adherence is good. Blood pressure control is suboptimal due to needing additional medication control. Denies significant dizziness.  - Continued losartan/hydrochlorothiazide 100/25mg  daily. - Started spironolactone 12.5 mg daily

## 2023-04-19 NOTE — Assessment & Plan Note (Addendum)
-  Patient provided verbal consent to participate in the Liberate study. Majority of visit focused on initiation of Libre 3 CGM sensor.   Diabetes longstanding uncontrolled with an A1c of 9.2%. Patient is able to verbalize appropriate hypoglycemia management plan. Medication adherence appears to be adherent. Control is suboptimal due to mixture of dietary and medication factors. -Continued metformin 1000mg  BID -Extensively discussed pathophysiology of diabetes, recommended lifestyle interventions, dietary effects on blood sugar control.

## 2023-04-20 NOTE — Progress Notes (Signed)
 Reviewed and agree with Dr Macky Lower plan.

## 2023-05-03 ENCOUNTER — Ambulatory Visit (INDEPENDENT_AMBULATORY_CARE_PROVIDER_SITE_OTHER): Admitting: Pharmacist

## 2023-05-03 ENCOUNTER — Encounter: Payer: Self-pay | Admitting: Pharmacist

## 2023-05-03 VITALS — BP 128/56 | HR 80 | Wt 265.6 lb

## 2023-05-03 DIAGNOSIS — E119 Type 2 diabetes mellitus without complications: Secondary | ICD-10-CM

## 2023-05-03 DIAGNOSIS — I10 Essential (primary) hypertension: Secondary | ICD-10-CM

## 2023-05-03 DIAGNOSIS — M62838 Other muscle spasm: Secondary | ICD-10-CM

## 2023-05-03 MED ORDER — CYCLOBENZAPRINE HCL 10 MG PO TABS: 10.0000 mg | ORAL_TABLET | Freq: Three times a day (TID) | ORAL | 2 refills | Status: DC | PRN

## 2023-05-03 MED ORDER — ROSUVASTATIN CALCIUM 20 MG PO TABS: 20.0000 mg | ORAL_TABLET | Freq: Every day | ORAL | 3 refills | Status: DC

## 2023-05-03 NOTE — Progress Notes (Signed)
 Reviewed and agree with Dr Macky Lower plan.

## 2023-05-03 NOTE — Assessment & Plan Note (Signed)
 Diabetes longstanding currently improved. Patient is able to verbalize appropriate hypoglycemia management plan. Medication adherence appears good. Control is suboptimal despite current DM medication regimen. Patient reports improved dietary habits following CGM initiation, has gradually lost weight over the course of the last month (270 lb (3/28) to 265.5 lb today (4/24)).  -Applied and connected new Libre 3 sensor. Educated patient on proper administration of Libre 3 sensor with skin tac wipe and tegaderm to assist with issues of sensor falling off. -Continued metformin  XR 2000 mg.  -Patient educated on purpose, proper use, and potential adverse effects.  -Extensively discussed pathophysiology of diabetes, recommended lifestyle interventions, dietary effects on blood sugar control.  -Counseled on s/sx of and management of hypoglycemia.

## 2023-05-03 NOTE — Assessment & Plan Note (Signed)
 Hypertension longstanding currently controlled. Blood pressure goal of <130/80 mmHg. Medication adherence good. Patient reports improved blood pressure control and reduced leg cramping since initiation of spironolactone  at last visit (4/10) -Continued losartan -hydrochlorothiazide 100-25 mg. -Continued spironolactone  12.5 mg daily -Obtained lab - BMET

## 2023-05-03 NOTE — Progress Notes (Signed)
 S:     Chief Complaint  Patient presents with   Medication Management    Diabetes follow-up   58 y.o. female who presents for diabetes evaluation, education, and management. Patient arrives in good spirits and presents without any assistance.   Patient was referred and last seen by Primary Care Provider, Dr. Bevin Bucks, on 04/06/23.   PMH is significant for T2DM, chronic pain, HLD, HTN.  At last visit, patient was started on Libre 3 CGM under the LIBERATE study.   Current diabetes medications include: metformin  XR 2000 mg Current hypertension medications include: losartan -hydrochlorothiazide 100-25 mg, spironolactone  12.5 mg (taking 1/2 tablet of 25mg ) Current hyperlipidemia medications include: rosuvastatin  20 mg  Patient reports adherence to taking all medications as prescribed.   Do you feel that your medications are working for you? yes Have you been experiencing any side effects to the medications prescribed? no - denies side effects since initiating spironolactone , reports reduced cramps since initiation. Do you have any problems obtaining medications due to transportation or finances? no Insurance coverage: [employer]  Patient denies hypoglycemic events.  Patient reported dietary habits: Reports improved dietary lifestyle due to monitoring glucose control with CGM Snacks: reduced fruit intake Drinks: water , reduced soda intake (0.5-1 cans/day)  O:   Review of Systems  All other systems reviewed and are negative.   Physical Exam Vitals reviewed.  Constitutional:      Appearance: Normal appearance.  Pulmonary:     Effort: Pulmonary effort is normal.  Neurological:     Mental Status: She is alert.  Psychiatric:        Mood and Affect: Mood normal.        Behavior: Behavior normal.        Thought Content: Thought content normal.        Judgment: Judgment normal.    Libre3 CGM Download today 05/03/23 % Time CGM is active: 66% Average Glucose: 200  mg/dL Glucose Management Indicator: 8.1  Glucose Variability: 20.9% (goal <36%) Time in Goal:  - Time in range 70-180: 39% - Time above range: 61% - Time below range: 0% Observed patterns: Slightly above target range. Reports sensor falling off ~ 5 days ago. Attempted to apply 2 more sensors to no success - one fell off and the other broke upon administration.   Lab Results  Component Value Date   HGBA1C 9.2 (A) 04/19/2023   Vitals:   05/03/23 1031  BP: (!) 128/56  Pulse: 80  SpO2: 95%    Lipid Panel     Component Value Date/Time   CHOL 272 (H) 09/25/2022 1206   TRIG 204 (H) 09/25/2022 1206   HDL 32 (L) 09/25/2022 1206   CHOLHDL 8.5 (H) 09/25/2022 1206   CHOLHDL 5.5 09/06/2012 1027   VLDL 19 09/06/2012 1027   LDLCALC 200 (H) 09/25/2022 1206   LDLDIRECT 205 (H) 11/06/2022 1233    Clinical Atherosclerotic Cardiovascular Disease (ASCVD): No  The 10-year ASCVD risk score (Arnett DK, et al., 2019) is: 26.3%   Values used to calculate the score:     Age: 79 years     Sex: Female     Is Non-Hispanic African American: Yes     Diabetic: Yes     Tobacco smoker: No     Systolic Blood Pressure: 128 mmHg     Is BP treated: Yes     HDL Cholesterol: 32 mg/dL     Total Cholesterol: 272 mg/dL   A/P: Diabetes longstanding currently improved. Patient is  able to verbalize appropriate hypoglycemia management plan. Medication adherence appears good. Control is suboptimal despite current DM medication regimen. Patient reports improved dietary habits following CGM initiation, has gradually lost weight over the course of the last month (270 lb (3/28) to 265.5 lb today (4/24)).  -Applied and connected new Libre 3 sensor. Educated patient on proper administration of Libre 3 sensor with skin tac wipe and tegaderm to assist with issues of sensor falling off. -Continued metformin  XR 2000 mg.  -Patient educated on purpose, proper use, and potential adverse effects.  -Extensively discussed  pathophysiology of diabetes, recommended lifestyle interventions, dietary effects on blood sugar control.  -Counseled on s/sx of and management of hypoglycemia.  -Next A1c anticipated July 2025.   Hypertension longstanding currently controlled. Blood pressure goal of <130/80 mmHg. Medication adherence good. Patient reports improved blood pressure control and reduced leg cramping since initiation of spironolactone  at last visit (4/10) -Continued losartan -hydrochlorothiazide 100-25 mg. -Continued spironolactone  12.5 mg daily -Obtained lab - BMET   Hyperlipidemia - LDL greater than 200 prior to statin therapy.  - Continued rosuvastatin  20mg  daily - Obtain Lipid/LDL evaluation with future lab draw   Written patient instructions provided. Patient verbalized understanding of treatment plan.  Total time in face to face counseling 33 minutes.    Follow-up:  Pharmacist 05/17/23 PCP clinic visit in [place] Patient seen with Teofilo Fellers, PharmD Candidate and Jeanine Millers, PharmD Candidate.

## 2023-05-03 NOTE — Patient Instructions (Addendum)
 It was nice to see you today!  Your goal blood sugar is 80-130 before eating and less than 180 after eating.  Medication Changes: Continue all other medication the same.   Keep up the good work with diet and exercise. Aim for a diet full of vegetables, fruit and lean meats (chicken, [place], fish). Try to limit salt intake by eating fresh or frozen vegetables (instead of canned), rinse canned vegetables prior to cooking and do not add any additional salt to meals.

## 2023-05-04 ENCOUNTER — Encounter: Payer: Self-pay | Admitting: Pharmacist

## 2023-05-04 LAB — BASIC METABOLIC PANEL WITH GFR
BUN/Creatinine Ratio: 24 — ABNORMAL HIGH (ref 9–23)
BUN: 21 mg/dL (ref 6–24)
CO2: 23 mmol/L (ref 20–29)
Calcium: 9.7 mg/dL (ref 8.7–10.2)
Chloride: 97 mmol/L (ref 96–106)
Creatinine, Ser: 0.89 mg/dL (ref 0.57–1.00)
Glucose: 163 mg/dL — ABNORMAL HIGH (ref 70–99)
Potassium: 4.8 mmol/L (ref 3.5–5.2)
Sodium: 138 mmol/L (ref 134–144)
eGFR: 75 mL/min/{1.73_m2} (ref >59)

## 2023-05-17 ENCOUNTER — Encounter: Payer: Self-pay | Admitting: Pharmacist

## 2023-05-17 ENCOUNTER — Ambulatory Visit (INDEPENDENT_AMBULATORY_CARE_PROVIDER_SITE_OTHER): Admitting: Pharmacist

## 2023-05-17 VITALS — BP 124/55 | HR 75 | Wt 262.0 lb

## 2023-05-17 DIAGNOSIS — E119 Type 2 diabetes mellitus without complications: Secondary | ICD-10-CM

## 2023-05-17 DIAGNOSIS — I10 Essential (primary) hypertension: Secondary | ICD-10-CM | POA: Diagnosis not present

## 2023-05-17 MED ORDER — LOSARTAN POTASSIUM-HCTZ 50-12.5 MG PO TABS: 1.0000 | ORAL_TABLET | Freq: Every day | ORAL | 3 refills | Status: DC

## 2023-05-17 MED ORDER — SPIRONOLACTONE 25 MG PO TABS: 12.5000 mg | ORAL_TABLET | Freq: Every day | ORAL | 3 refills | Status: DC

## 2023-05-17 MED ORDER — LOSARTAN POTASSIUM-HCTZ 50-12.5 MG PO TABS: 1.0000 | ORAL_TABLET | Freq: Every day | ORAL | 3 refills | Status: AC

## 2023-05-17 NOTE — Addendum Note (Signed)
 Addended by: Sheryll Dymek G on: 05/17/2023 03:50 PM   Modules accepted: Orders

## 2023-05-17 NOTE — Assessment & Plan Note (Signed)
 Hypertension longstanding and currently controlled. Blood pressure goal of <130/80 mmHg. Medication adherence usually good, but has not taken BP medicine since Monday due to lack of refills. Also reports slight headache since running out of medication. Despite this, BP reading today appears well controlled in clinic (124/55) likely due to recent weight loss.  - Decrease dose of losartan /hydrochlorothiazide from 100/25mg  daily to 50/12.5 mg once daily to prevent hypotension and improve nocturia - Continue spironolactone  12.5mg  (1/2 tablet of 25mg ) daily.  BMP today

## 2023-05-17 NOTE — Progress Notes (Signed)
 S:     Chief Complaint  Patient presents with   Medication Management    Diabetes follow-up - Hypertension   58 y.o. female who presents for diabetes evaluation, education, and management. Patient arrives in good spirits and presents without any assistance.   Patient was referred and last seen by Primary Care Provider, Dr. Bevin Bucks, on 3/28.  Participant in the LIBERATE study (enrolled on 04/19/23). PMH significant for HTN, HLD.  Patient reports Diabetes was diagnosed in 2017.   Current diabetes medications include: Metformin  2 g/day Current hypertension medications include: losartan /hydrochlorothiazide 100/25, spironolactone  12.5 mg daily. Requested refills and has been out of medications since Monday (3 days ago).  Current hyperlipidemia medications include: rosuvastatin  20 mg  Patient reports adherence to taking all medications as prescribed.   Do you feel that your medications are working for you? yes Have you been experiencing any side effects to the medications prescribed? no Do you have any problems obtaining medications due to transportation or finances? no Insurance coverage: [employer] through employer.   Patient reports one hypoglycemic event, where she was alerted by FreeStyle but asymptomatic. Drank a glass of Orange Juice to correct.   Patient reports nocturia (nighttime urination), likely from both elevated BG and use of hydrochlorothiazide and spironolactone .   Patient reported dietary habits: Eats multiple small meals/day. Has eaten smaller portion sizes due to weight loss. Typically eats yogurt, fruit, salads, and has tried to limit bread intake. Also avoids juices and mainly drinks water  and Coke Zero.   O:   Review of Systems  Neurological:  Positive for headaches.  All other systems reviewed and are negative.   Physical Exam Constitutional:      Appearance: Normal appearance.  Neurological:     Mental Status: She is alert.  Psychiatric:         Mood and Affect: Mood normal.        Behavior: Behavior normal.        Thought Content: Thought content normal.        Judgment: Judgment normal.    Libre3 CGM Download today: April 25th - May 8th  % Time CGM is active: 91% Average Glucose: 178 mg/dL Glucose Management Indicator: 7.6%  Glucose Variability: 19.6% (goal <36%) Time in Goal:  - Time in range 70-180: 58% - Time above range: 38% - Time below range: 4% Observed patterns: Time under control has improved over past week. Spikes most commonly occur in late afternoon/early evening.    Lab Results  Component Value Date   HGBA1C 9.2 (A) 04/19/2023   Vitals:   05/17/23 1029 05/17/23 1033  BP: 113/76 (!) 124/55  Pulse: 76 75    Lipid Panel     Component Value Date/Time   CHOL 272 (H) 09/25/2022 1206   TRIG 204 (H) 09/25/2022 1206   HDL 32 (L) 09/25/2022 1206   CHOLHDL 8.5 (H) 09/25/2022 1206   CHOLHDL 5.5 09/06/2012 1027   VLDL 19 09/06/2012 1027   LDLCALC 200 (H) 09/25/2022 1206   LDLDIRECT 205 (H) 11/06/2022 1233    Clinical Atherosclerotic Cardiovascular Disease (ASCVD): No  The 10-year ASCVD risk score (Arnett DK, et al., 2019) is: 24.2%   Values used to calculate the score:     Age: 26 years     Sex: Female     Is Non-Hispanic African American: Yes     Diabetic: Yes     Tobacco smoker: No     Systolic Blood Pressure: 124 mmHg  Is BP treated: Yes     HDL Cholesterol: 32 mg/dL     Total Cholesterol: 272 mg/dL    A/P: Diabetes longstanding (2017) currently uncontrolled but improving based on GMI of 7.6. Patient is able to verbalize appropriate hypoglycemia management plan. Medication adherence appears good. Control has improved with use of CGM to learn of impact of dietary habits, and with use of metformin . Patient has also been losing weight recently which likely has helped with readings. Encouraged patient to limit intake of bread, juices, and other foods she noticed elevate her BG.  - Continue  metformin  1,000 mg BID.   ASCVD risk - primary prevention in patient with diabetes. Last LDL is 205 not at goal of <16  mg/dL. ASCVD risk factors include T2DM, HTN, and 10-year ASCVD risk score of 24.2%. High intensity statin indicated and currently in use.  - Consider direct LDL or lipid panel at future fasting blood draw.   Hypertension longstanding and currently controlled. Blood pressure goal of <130/80 mmHg. Medication adherence usually good, but has not taken BP medicine since Monday due to lack of refills. Also reports slight headache since running out of medication. Despite this, BP reading today appears well controlled in clinic (124/55) likely due to recent weight loss.  - Decrease dose of losartan /hydrochlorothiazide from 100/25mg  daily to 50/12.5 mg once daily to prevent hypotension and improve nocturia - Continue spironolactone  12.5mg  (1/2 tablet of 25mg ) daily.  BMP today  Written patient instructions provided. Patient verbalized understanding of treatment plan.  Total time in face to face counseling 31 minutes.    Follow-up:  Pharmacist on 6/19.  PCP clinic visit on 5/16 Patient seen with Noah [place], PharmD Candidate.

## 2023-05-17 NOTE — Patient Instructions (Signed)
 It was nice to see you today!  Your goal blood sugar is 80-130 before eating and less than 180 after eating.  Medication Changes: Decrease dose of losartan /hydrochlorothiazide (Hyzaar) to 50 mg/12.5 mg. You will still be taking 1 tablet once daily.  Continue to use FreeStyle Libre Sensor to learn how different foods affect your blood sugar  Continue all other medication the same.   Keep up the good work with diet and exercise. Aim for a diet full of vegetables, fruit and lean meats (chicken, [place], fish). Try to limit salt intake by eating fresh or frozen vegetables (instead of canned), rinse canned vegetables prior to cooking and do not add any additional salt to meals.

## 2023-05-17 NOTE — Assessment & Plan Note (Signed)
 Diabetes longstanding (2017) currently uncontrolled but improving based on GMI of 7.6. Patient is able to verbalize appropriate hypoglycemia management plan. Medication adherence appears good. Control has improved with use of CGM to learn of impact of dietary habits, and with use of metformin . Patient has also been losing weight recently which likely has helped with readings. Encouraged patient to limit intake of bread, juices, and other foods she noticed elevate her BG.  - Continue metformin  1,000 mg BID.

## 2023-05-18 ENCOUNTER — Telehealth: Payer: Self-pay | Admitting: Pharmacist

## 2023-05-18 LAB — BASIC METABOLIC PANEL WITH GFR
BUN/Creatinine Ratio: 29 — ABNORMAL HIGH (ref 9–23)
BUN: 25 mg/dL — ABNORMAL HIGH (ref 6–24)
CO2: 24 mmol/L (ref 20–29)
Calcium: 9.8 mg/dL (ref 8.7–10.2)
Chloride: 95 mmol/L — ABNORMAL LOW (ref 96–106)
Creatinine, Ser: 0.86 mg/dL (ref 0.57–1.00)
Glucose: 140 mg/dL — ABNORMAL HIGH (ref 70–99)
Potassium: 4.4 mmol/L (ref 3.5–5.2)
Sodium: 137 mmol/L (ref 134–144)
eGFR: 78 mL/min/{1.73_m2} (ref >59)

## 2023-05-18 NOTE — Telephone Encounter (Signed)
-----   Message from Daleen Dubs sent at 05/18/2023  9:11 AM EDT -----  ----- Message ----- From: Garner Jury Lab Results In Sent: 05/18/2023   8:13 AM EDT To: Demetra Filter McDiarmid, MD

## 2023-05-18 NOTE — Telephone Encounter (Signed)
 Patient contacted for follow-up of lab results.  Since last contact patient reports she was able to pick-up both medication prescriptions provided at the visit yesterday.   We discussed her lab results. I shared details including how a few results are slightly out of the range but are acceptable and there is no concern.   Potassium and Creatinine are unchanged/stable and in range.   Total time with patient call and documentation of interaction: 7 minutes.

## 2023-05-23 NOTE — Progress Notes (Signed)
 Reviewed and agree with Dr Macky Lower plan.

## 2023-05-24 NOTE — Progress Notes (Unsigned)
    SUBJECTIVE:   CHIEF COMPLAINT: HTN, BG HPI:   Alyssa Rangel is a 58 y.o.  with history notable for HTN, type 2 DM, and obesity presenting for follow up.   Patient reported some dyspnea and chronic cough previously.  This is all resolved and she feels much better.  She believes is related to her weight.  She declines pulmonary medicine evaluation at this point.  Diabetes  Metformin  therapy: 2000 mg Additional agents: None as she cannot afford the oral semaglutide  Adverse effects: None Dietary review: Doing better with her sweets Physical activity: Is trying to increase activity Statin: Yes ACE/ARB: Yes The patient wears a continuous glucose monitor.  Her time in therapeutic range is 77%.  She does report some dietary discretions this week with the nurses week.  There have been a lot of sweets at her nursing home.  Overall she reports otherwise she is feeling well.  She is tolerating her new blood pressure medications well.    PERTINENT  PMH / PSH/Family/Social History : Obtained and reviewed as appropriate  OBJECTIVE:   BP 130/66   Pulse 70   Ht 5\' 4"  (1.626 m)   Wt 263 lb 6.4 oz (119.5 kg)   SpO2 99%   BMI 45.21 kg/m   Today's weight:  Last Weight  Most recent update: 05/25/2023 10:01 AM    Weight  119.5 kg (263 lb 6.4 oz)            Review of prior weights: Filed Weights   05/25/23 1001  Weight: 263 lb 6.4 oz (119.5 kg)    Cardiac: Regular rate and rhythm. Normal S1/S2. No murmurs, rubs, or gallops appreciated. Lungs: Clear bilaterally to ascultation.  Abdomen: Normoactive bowel sounds. No tenderness to deep or light palpation. No rebound or guarding.   Psych: Pleasant and appropriate    ASSESSMENT/PLAN:   Assessment & Plan Essential hypertension At goal Type 2 diabetes mellitus without complication, without long-term current use of insulin  (HCC) Congratulated on lifestyle changes.  She has lost weight with lifestyle changes and increasing  her metformin .  Time in therapeutic ranges improved.  She will schedule in July for an A1c.  At that time she is also due for lipid panel      Otho Blitz, MD  Family Medicine Teaching Service  [facility]

## 2023-05-25 ENCOUNTER — Encounter: Payer: Self-pay | Admitting: Family Medicine

## 2023-05-25 ENCOUNTER — Ambulatory Visit: Admitting: Family Medicine

## 2023-05-25 VITALS — BP 130/66 | HR 70 | Ht 64.0 in | Wt 263.4 lb

## 2023-05-25 DIAGNOSIS — I10 Essential (primary) hypertension: Secondary | ICD-10-CM | POA: Diagnosis not present

## 2023-05-25 DIAGNOSIS — E119 Type 2 diabetes mellitus without complications: Secondary | ICD-10-CM | POA: Diagnosis not present

## 2023-05-25 NOTE — Patient Instructions (Signed)
 It was wonderful to see you today.  Please bring ALL of your medications with you to every visit.   Today we talked about:  GREAT WORK WITH YOUR SUGARS   If your breathing changes again, call me  Continue your current medications   Follow up in July   Thank you for choosing [facility].   Please call [phone] with any questions about today's appointment.  Please be sure to schedule follow up at the front  desk before you leave today.   Otho Blitz, MD  Family Medicine

## 2023-05-25 NOTE — Assessment & Plan Note (Signed)
 Congratulated on lifestyle changes.  She has lost weight with lifestyle changes and increasing her metformin .  Time in therapeutic ranges improved.  She will schedule in July for an A1c.  At that time she is also due for lipid panel

## 2023-06-28 ENCOUNTER — Ambulatory Visit: Admitting: Pharmacist

## 2023-06-28 ENCOUNTER — Encounter: Payer: Self-pay | Admitting: Pharmacist

## 2023-06-28 VITALS — BP 112/63 | HR 77 | Wt 263.0 lb

## 2023-06-28 DIAGNOSIS — I10 Essential (primary) hypertension: Secondary | ICD-10-CM | POA: Diagnosis not present

## 2023-06-28 DIAGNOSIS — M62838 Other muscle spasm: Secondary | ICD-10-CM

## 2023-06-28 DIAGNOSIS — E119 Type 2 diabetes mellitus without complications: Secondary | ICD-10-CM | POA: Diagnosis not present

## 2023-06-28 MED ORDER — CYCLOBENZAPRINE HCL 10 MG PO TABS: 10.0000 mg | ORAL_TABLET | Freq: Three times a day (TID) | ORAL | 1 refills | Status: DC | PRN

## 2023-06-28 MED ORDER — SPIRONOLACTONE 25 MG PO TABS: 12.5000 mg | ORAL_TABLET | Freq: Every day | ORAL | 11 refills | Status: DC

## 2023-06-28 NOTE — Assessment & Plan Note (Signed)
 Diabetes longstanding (2017) currently greatly improved control  based on GMI of 7.2. Medication adherence appears good. Control has improved with use of CGM to learn of impact of dietary habits, and with use of metformin . Patient has verbalized weight loss goal of 5-10 pounds in the next month. th readings.  - Continue metformin  1,000 mg BID.

## 2023-06-28 NOTE — Assessment & Plan Note (Signed)
 Hypertension longstanding and currently controlled. Blood pressure goal of <130/80 mmHg. Medication adherence reported as good.    - Continue losartan /hydrochlorothiazide 50/12.5 mg once daily  - Continue spironolactone  12.5mg  (1/2 tablet of 25mg ) daily.  BMP at next visit.

## 2023-06-28 NOTE — Patient Instructions (Signed)
 It was nice to see you today!  Your blood pressure is at goal.  Your glucose control is much improved.  Next visit with me you will get an A1C.  Your goal blood sugar is 80-130 before eating and less than 180 after eating.  Medication Changes: Continue all other medication the same.   Keep up the good work with diet and exercise. Aim for a diet full of vegetables, fruit and lean meats (chicken, [place], fish). Try to limit salt intake by eating fresh or frozen vegetables (instead of canned), rinse canned vegetables prior to cooking and do not add any additional salt to meals.

## 2023-06-28 NOTE — Progress Notes (Signed)
 S:     Chief Complaint  Patient presents with   Medication Management    Diabetes - Liberate Follow-up   58 y.o. female who presents for diabetes evaluation, education, and management. Patient arrives in good spirits and presents without any assistance.     Patient was referred and last seen by Primary Care Provider, Dr. Bevin Bucks, on 06/25/2023.  At last visit, patient was improved with glucose and blood pressure.  Patient reports Diabetes was diagnosed in 2017.   Current diabetes medications include: Metformin  2 g/day Current hypertension medications include: losartan /hydrochlorothiazide 50/12.5mg  daily, spironolactone  12.5 mg daily.  Current hyperlipidemia medications include: rosuvastatin  20 mg  Patient reports adherence to taking all medications as prescribed.   Patient reports hypoglycemic events with CGM alarms of high 60s but without symptoms.  Discussed these are actually OK and we adjusted her low reading alarm to 65 as she does not have any insulin  therapy or sulfonylurea therapy at this time and is unlikely to have hypoglycemia on current regimen.  Reported home fasting blood sugars: most readings in the 100s.   Patient-reported exercise habits: increased Reports her work colleagues are helping her to keep on track with her goals.   We discussed a goal of losing 5 lbs my her PCP visit in later July.  She was 10/10 confident she could achieve that goal.   O:   Review of Systems  Musculoskeletal:        Intermittent nocturnal leg cramping.   Neurological:  Negative for dizziness.  All other systems reviewed and are negative.   Physical Exam Vitals reviewed.  Pulmonary:     Effort: Pulmonary effort is normal.   Neurological:     Mental Status: She is alert.   Psychiatric:        Mood and Affect: Mood normal.        Thought Content: Thought content normal.    Libre3 CGM Download today 06/28/2023 % Time CGM is active: 86% Average Glucose: 161  mg/dL Glucose Management Indicator: 7.2  Glucose Variability: 22% (goal <36%) Time in Goal:  - Time in range 70-180: 76% - Time above range: 24% - Time below range: 0% Observed patterns:  Lab Results  Component Value Date   HGBA1C 9.2 (A) 04/19/2023   Vitals:   06/28/23 1035  BP: 112/63  Pulse: 77  SpO2: 95%    Lipid Panel     Component Value Date/Time   CHOL 272 (H) 09/25/2022 1206   TRIG 204 (H) 09/25/2022 1206   HDL 32 (L) 09/25/2022 1206   CHOLHDL 8.5 (H) 09/25/2022 1206   CHOLHDL 5.5 09/06/2012 1027   VLDL 19 09/06/2012 1027   LDLCALC 200 (H) 09/25/2022 1206   LDLDIRECT 205 (H) 11/06/2022 1233    Clinical Atherosclerotic Cardiovascular Disease (ASCVD):  The 10-year ASCVD risk score (Arnett DK, et al., 2019) is: 18.3%   Values used to calculate the score:     Age: 71 years     Clincally relevant sex: Female     Is Non-Hispanic African American: Yes     Diabetic: Yes     Tobacco smoker: No     Systolic Blood Pressure: 112 mmHg     Is BP treated: Yes     HDL Cholesterol: 32 mg/dL     Total Cholesterol: 272 mg/dL     A/P: Diabetes longstanding (2017) currently greatly improved control  based on GMI of 7.2. Medication adherence appears good. Control has improved with use of CGM  to learn of impact of dietary habits, and with use of metformin . Patient has verbalized weight loss goal of 5-10 pounds in the next month. th readings.  - Continue metformin  1,000 mg BID.  Assisted with reapplication of CGM sensor and instructed on use of skin tack with cover dressing to improve CGM duration of use.    ASCVD risk - primary prevention in patient with diabetes. Last LDL is 205 not at goal of <40  mg/dL. ASCVD risk factors include T2DM, HTN, and 10-year ASCVD risk score of 24.2%. High intensity statin indicated and currently in use.  - Consider direct LDL or lipid panel at future fasting blood draw.    Hypertension longstanding and currently controlled. Blood pressure goal  of <130/80 mmHg. Medication adherence reported as good.    - Continue losartan /hydrochlorothiazide 50/12.5 mg once daily  - Continue spironolactone  12.5mg  (1/2 tablet of 25mg ) daily.  BMP at next visit. Written patient instructions provided. Patient verbalized understanding of treatment plan.  Total time in face to face counseling 27 minutes.    Follow-up:  Pharmacist 07/16/2023 for 3 month Liberate study visit - A1C at that time.  PCP clinic visit in 08/03/2023

## 2023-06-29 NOTE — Progress Notes (Signed)
 Reviewed and agree with Dr Macky Lower plan.

## 2023-07-16 ENCOUNTER — Encounter: Admitting: Pharmacist

## 2023-07-16 ENCOUNTER — Other Ambulatory Visit (HOSPITAL_COMMUNITY): Payer: Self-pay

## 2023-07-16 ENCOUNTER — Encounter: Payer: Self-pay | Admitting: Pharmacist

## 2023-07-16 VITALS — BP 123/72 | HR 77 | Wt 268.2 lb

## 2023-07-16 DIAGNOSIS — E119 Type 2 diabetes mellitus without complications: Secondary | ICD-10-CM

## 2023-07-16 DIAGNOSIS — E7800 Pure hypercholesterolemia, unspecified: Secondary | ICD-10-CM

## 2023-07-16 DIAGNOSIS — I10 Essential (primary) hypertension: Secondary | ICD-10-CM

## 2023-07-16 MED ORDER — MOUNJARO 2.5 MG/0.5ML ~~LOC~~ SOAJ
2.5000 mg | SUBCUTANEOUS | Status: DC
Start: 2023-07-16 — End: 2023-09-06

## 2023-07-16 NOTE — Research (Signed)
 S:     Chief Complaint  Patient presents with   Medication Management    Liberate - 3 month    58 y.o. female who presents for diabetes evaluation, education, and management in the context of the LIBERATE Study.  Today, patient arrives in good spirits and presents without any assistance.   Patient was referred and last seen by Primary Care Provider, Dr. Faustine, on 05/25/2023.  At last visit, patient had GMI of 7.2 and metformin  monotherapy was continued with diet and exercise plan.   Patient reports Diabetes was diagnosed 2017.   Family/Social History: Recently enjoyed cooking for a large family reunion - 4th of July celebration.  Current diabetes medications include: Metformin  2 g/day Current hypertension medications include: losartan /hydrochlorothiazide 50/12.5mg  daily, spironolactone  12.5 mg daily.  Current hyperlipidemia medications include: rosuvastatin  20 mg  Patient reports adherence to taking all medications as prescribed.   Do you feel that your medications are working for you? yes Have you been experiencing any side effects to the medications prescribed? no Do you have any problems obtaining medications due to transportation or finances? Yes - high deductible plan - still has deductible remaining.   Patient denies hypoglycemic events.  O:   Review of Systems  All other systems reviewed and are negative.   Physical Exam Vitals reviewed.  Constitutional:      Appearance: Normal appearance.  Pulmonary:     Effort: Pulmonary effort is normal.  Neurological:     Mental Status: She is alert.  Psychiatric:        Mood and Affect: Mood normal.        Behavior: Behavior normal.        Thought Content: Thought content normal.        Judgment: Judgment normal.     Libre3 CGM Download today 07/16/2023 % Time CGM is active: 86% Average Glucose: 183 mg/dL Glucose Management Indicator: 7.7  Glucose Variability: 19% (goal <36%) Time in Goal:  - Time in range  70-180: 54% - Time above range: 46% - Time below range: 0%  Lab Results  Component Value Date   HGBA1C 9.2 (A) 04/19/2023   Vitals:   07/16/23 1116  BP: 123/72  Pulse: 77  SpO2: 99%     Lipid Panel     Component Value Date/Time   CHOL 272 (H) 09/25/2022 1206   TRIG 204 (H) 09/25/2022 1206   HDL 32 (L) 09/25/2022 1206   CHOLHDL 8.5 (H) 09/25/2022 1206   CHOLHDL 5.5 09/06/2012 1027   VLDL 19 09/06/2012 1027   LDLCALC 200 (H) 09/25/2022 1206   LDLDIRECT 205 (H) 11/06/2022 1233    Clinical Atherosclerotic Cardiovascular Disease (ASCVD):  The 10-year ASCVD risk score (Arnett DK, et al., 2019) is: 23.6%   Values used to calculate the score:     Age: 5 years     Clincally relevant sex: Female     Is Non-Hispanic African American: Yes     Diabetic: Yes     Tobacco smoker: No     Systolic Blood Pressure: 123 mmHg     Is BP treated: Yes     HDL Cholesterol: 32 mg/dL     Total Cholesterol: 272 mg/dL     A/P:  LIBERATE Study:  - Additional sensors provided for a 3 month supply. Educated to contact the office if the sensor falls off early and replacements are needed before their next [employer].  Reviewed use of both Tegaderm and SkinTac  Diabetes longstanding (  2017) currently stable control  based on GMI of 7.7 which in only slightly higher than last (7.2) Today, A1C was improved at 8.3  Medication adherence appears good. Control has improved with use of CGM to learn of impact of dietary habits, and with use of metformin . Patient not happy with weight regain since last appointment. Patient has verbalized weight loss goal of 5 pounds in the next month. th readings.  Patient unfortunately is within deductible at this time (test claims for both Ozempic  (semaglutide ) and Mounjaro  (tirzepatide ) were ~ $1000 - Continue metformin  1,000 mg BID.  - START GLP-1 Mounjaro  (tirzepatide ) at 2.5mg  weekly with use of sample - asked patient to inquire about her remaining deductible by  contacting her insurance company prior to nex visit.  -Patient educated on purpose, proper use, and potential adverse effects.  -Extensively discussed pathophysiology of diabetes, recommended lifestyle interventions, dietary effects on blood sugar control.  - UACR was 114 in 10/2022 - consider recheck and SGLT2 Tx in future. -Counseled on s/sx of and management of hypoglycemia.  -Next A1c anticipated 3 month end of liberate study.   ASCVD risk - primary prevention in patient with diabetes. Last LDL is 205 not at goal of <29 mg/dL. high intensity statin indicated.  -Continued rosuvastatin  20 mg.  - Repeat Direct LDL today.   Hypertension longstanding currently controlled. Blood pressure goal of <130/80 mmHg. Medication adherence appears good. Continue current medication - no change. - Continue losartan /hydrochlorothiazide 50/12.5 mg daily and Spironolactone  12.5mg  daily (half-tablet) - BMET today  Written patient instructions provided. Patient verbalized understanding of treatment plan.  Total time in face to face counseling 27 minutes.    Follow-up:  Pharmacist 4 weeks. PCP clinic visit in TBD

## 2023-07-16 NOTE — Assessment & Plan Note (Signed)
 ASCVD risk - primary prevention in patient with diabetes. Last LDL is 205 not at goal of <29 mg/dL. high intensity statin indicated.  -Continued rosuvastatin  20 mg.  - Repeat Direct LDL today.

## 2023-07-16 NOTE — Assessment & Plan Note (Signed)
 Hypertension longstanding currently controlled. Blood pressure goal of <130/80 mmHg. Medication adherence appears good. Continue current medication - no change. - Continue losartan /hydrochlorothiazide 50/12.5 mg daily and Spironolactone  12.5mg  daily (half-tablet) - BMET today

## 2023-07-16 NOTE — Patient Instructions (Signed)
 It was nice to see you today!  Your goal blood sugar is 80-130 before eating and less than 180 after eating.  Medication Changes: START Mounjaro  (tirzepatide )  0.25mg  once weekly  Continue all other medication the same.   Keep up the good work with diet and exercise. Aim for a diet full of vegetables, fruit and lean meats (chicken, [place], fish). Try to limit salt intake by eating fresh or frozen vegetables (instead of canned), rinse canned vegetables prior to cooking and do not add any additional salt to meals.

## 2023-07-16 NOTE — Assessment & Plan Note (Signed)
 Diabetes longstanding (2017) currently stable control  based on GMI of 7.7 which in only slightly higher than last (7.2) Today, A1C was improved at 8.3  Medication adherence appears good. Control has improved with use of CGM to learn of impact of dietary habits, and with use of metformin . Patient not happy with weight regain since last appointment. Patient has verbalized weight loss goal of 5 pounds in the next month. th readings.  Patient unfortunately is within deductible at this time (test claims for both Ozempic  (semaglutide ) and Mounjaro  (tirzepatide ) were ~ $1000 - Continue metformin  1,000 mg BID.  - START GLP-1 Mounjaro  (tirzepatide ) at 2.5mg  weekly with use of sample - asked patient to inquire about her remaining deductible by contacting her insurance company prior to nex visit.  -Patient educated on purpose, proper use, and potential adverse effects.  -Extensively discussed pathophysiology of diabetes, recommended lifestyle interventions, dietary effects on blood sugar control.  - UACR was 114 in 10/2022 - consider recheck and SGLT2 Tx in future. -Counseled on s/sx of and management of hypoglycemia.

## 2023-07-17 ENCOUNTER — Ambulatory Visit: Payer: Self-pay | Admitting: Pharmacist

## 2023-07-17 LAB — BASIC METABOLIC PANEL WITH GFR
BUN/Creatinine Ratio: 20 (ref 9–23)
BUN: 14 mg/dL (ref 6–24)
CO2: 23 mmol/L (ref 20–29)
Calcium: 9.4 mg/dL (ref 8.7–10.2)
Chloride: 100 mmol/L (ref 96–106)
Creatinine, Ser: 0.71 mg/dL (ref 0.57–1.00)
Glucose: 169 mg/dL — ABNORMAL HIGH (ref 70–99)
Potassium: 4.7 mmol/L (ref 3.5–5.2)
Sodium: 139 mmol/L (ref 134–144)
eGFR: 98 mL/min/1.73 (ref >59)

## 2023-07-17 LAB — LDL CHOLESTEROL, DIRECT: LDL Direct: 80 mg/dL (ref 0–99)

## 2023-07-17 NOTE — Progress Notes (Signed)
 Electrolyte panel is stable / normal / good.   Cholesterol - LDL is much improved from previous.    Please continue with current treatment plans -  unchanged.

## 2023-07-17 NOTE — Progress Notes (Signed)
 Reviewed

## 2023-08-02 NOTE — Progress Notes (Unsigned)
    SUBJECTIVE:   CHIEF COMPLAINT: cjecl I[ HPI:   Alyssa Rangel is a 58 y.o.  with history notable for type 2 diabetes, HTN, obesity  presenting for follow up.  She recently started Mounjaro . Has CGM. Sugars are doing well. Her A1C today is 8.1. Started Mounjaor 2.5 mg and on maximal dose metformin . No side effects. Has done 2 doses.  Blood Pressure Reports compliance with medications. No adverse effects.    R breast bump  Noted inferior R breast bump 2 weeks ago. No drainage. Was painful. Has used warm compresses on this.  Had a mammogram in December.   PERTINENT  PMH / PSH/Family/Social History : HTN, type 2 DM   OBJECTIVE:   BP 128/60   Pulse 81   Ht 5' 4 (1.626 m)   Wt 266 lb (120.7 kg)   SpO2 99%   BMI 45.66 kg/m   Today's weight:  Last Weight  Most recent update: 08/03/2023 10:11 AM    Weight  120.7 kg (266 lb)            Review of prior weights: [employer]   08/03/23 1011  Weight: 266 lb (120.7 kg)   Breasts: L breasts appear normal, no suspicious masses, no skin or nipple changes or axillary nodes. On R breast at 5 oclock position there is a 0.5 cm mobile firm nodule RRR Lungs clear   Chaperone- D Ottley   ASSESSMENT/PLAN:   Assessment & Plan Type 2 diabetes mellitus without complication, without long-term current use of insulin  (HCC) A1C improved Continue 2 more dose of Mounjaro  2.5 mg  Increase to 5 mg dose weekly in 2 weeks Will discuss with pharmacy cost and such  She is on a statin  Mass of lower inner quadrant of right breast Likely sebaceous cyst but also considered fibroadenoma Given age, diagnostic and screening mammogram to [facility]    HCM - Zoster declined due to cost - UTD on low dose CT    At next visit obtain UACR   Suzann Daring, MD  Family Medicine Teaching Service  [facility]

## 2023-08-03 ENCOUNTER — Telehealth: Payer: Self-pay | Admitting: Family Medicine

## 2023-08-03 ENCOUNTER — Telehealth: Payer: Self-pay | Admitting: Pharmacist

## 2023-08-03 ENCOUNTER — Ambulatory Visit: Admitting: Family Medicine

## 2023-08-03 ENCOUNTER — Encounter: Payer: Self-pay | Admitting: Family Medicine

## 2023-08-03 VITALS — BP 128/60 | HR 81 | Ht 64.0 in | Wt 266.0 lb

## 2023-08-03 DIAGNOSIS — E119 Type 2 diabetes mellitus without complications: Secondary | ICD-10-CM | POA: Diagnosis not present

## 2023-08-03 DIAGNOSIS — N6314 Unspecified lump in the right breast, lower inner quadrant: Secondary | ICD-10-CM

## 2023-08-03 LAB — POCT GLYCOSYLATED HEMOGLOBIN (HGB A1C): HbA1c, POC (controlled diabetic range): 8.1 % — AB (ref 0.0–7.0)

## 2023-08-03 NOTE — Patient Instructions (Signed)
 It was wonderful to see you today.  Please bring ALL of your medications with you to every visit.   Today we talked about:   CONGRATULATIONS  Your diabetes number is 8.1!!  Your goal is 7-8    - I will send you a message about the next dose of Mounjaro   You blood pressure looks great  I think that is a small superficial cyst in your right breast I do recommend a mammogram  Call [facility] to schedule   Address: [address] [place], [place], [place] [address] Phone: [phone] Please follow up in 2  months as we will need to increase your Mounjaro  dose     Thank you for choosing [facility].   Please call [phone] with any questions about today's appointment.  Please be sure to schedule follow up at the front  desk before you leave today.   Suzann Daring, MD  Family Medicine

## 2023-08-03 NOTE — Assessment & Plan Note (Signed)
 A1C improved Continue 2 more dose of Mounjaro  2.5 mg  Increase to 5 mg dose weekly in 2 weeks Will discuss with pharmacy cost and such  She is on a statin

## 2023-08-03 NOTE — Telephone Encounter (Signed)
 Please fax mammogram orders to [facility] in [place] given insurance.  Suzann Daring, MD  Family Medicine Teaching Service

## 2023-08-03 NOTE — Telephone Encounter (Signed)
 Patient contacted for follow-up of diabetes control and Mounjaro  (tirzepatide ) use.   Since last contact patient reports her glucose readings are good and she has lost a few pounds. She has not yet had the chance to contact her insurance provider to determine her deductible status.   She has 1 Mounjaro  (tirzepatide ) 2.5mg  pen remaining.   We agreed on the plan for her to contact her insurance company, determine status of deductible then she will call me back and share info.  I shared my direct phone contact. If she has not yet met deductible - plan is to continue 2.5mg  dose with sample.  If deductible is met - plan to increase dose to 5mg  weekly   She thanked me for the call.  Total time with patient call and documentation of interaction: 11 minutes.

## 2023-08-03 NOTE — Telephone Encounter (Signed)
 Orders has been faxed to [facility] [phone]. Cassell Mary CMA

## 2023-08-03 NOTE — Telephone Encounter (Signed)
-----   Message from Suzann CHRISTELLA Daring sent at 08/03/2023  3:31 PM EDT ----- Can you please reach out--thank you!!! ----- Message ----- From: Amalia Maude MATSU, RPH-CPP Sent: 08/03/2023   2:59 PM EDT To: Lavern LOISE Ku, CPhT; Suzann CHRISTELLA Daring, MD  Then sample of 2.5 weekly is likely our best option.  We do have ~ 8-9 boxes (each 1 month supply).    Do you want me to reach out to her? ----- Message ----- From: Daring Suzann CHRISTELLA, MD Sent: 08/03/2023   2:41 PM EDT To: Maude MATSU Amalia, RPH-CPP  Alas no! ----- Message ----- From: Amalia Maude MATSU, RPH-CPP Sent: 08/03/2023   2:29 PM EDT To: Suzann CHRISTELLA Daring, MD  Has patient communicated her remaining deductible to us ? ----- Message ----- From: Daring Suzann CHRISTELLA, MD Sent: 08/03/2023  10:48 AM EDT To: [facility]  Hey Pharmacy Team--any recommendations for getting Nanette her 5 mg Mounjaro ?  Thanks CB

## 2023-08-06 NOTE — Telephone Encounter (Signed)
 Reviewed and agree with Dr Macky Lower plan.

## 2023-08-13 ENCOUNTER — Encounter: Payer: Self-pay | Admitting: Pharmacist

## 2023-08-13 ENCOUNTER — Ambulatory Visit (INDEPENDENT_AMBULATORY_CARE_PROVIDER_SITE_OTHER): Admitting: Pharmacist

## 2023-08-13 VITALS — BP 111/62 | HR 75 | Wt 265.0 lb

## 2023-08-13 DIAGNOSIS — E119 Type 2 diabetes mellitus without complications: Secondary | ICD-10-CM | POA: Diagnosis not present

## 2023-08-13 DIAGNOSIS — M62838 Other muscle spasm: Secondary | ICD-10-CM

## 2023-08-13 MED ORDER — CYCLOBENZAPRINE HCL 10 MG PO TABS: 10.0000 mg | ORAL_TABLET | Freq: Three times a day (TID) | ORAL | 1 refills | Status: AC | PRN

## 2023-08-13 NOTE — Progress Notes (Signed)
 S:     Chief Complaint  Patient presents with   Medication Management    Diabetes - Liberate Follow-up   58 y.o. female who presents for diabetes evaluation, education, and management. Patient arrives in good spirits and presents without any assistance.   Patient was referred and last seen by Primary Care Provider, Dr. Faustine, on 08/03/2023.  At last visit, pharmacist visit patient had A1C of 8.1 Metformin  monotherapy was continued and Mounjaro  (tirzepatide ) was initiated.    Patient reports Diabetes was diagnosed 2017.    Current diabetes medications include: Metformin  2 g/day, Mounjaro  (tirzepatide ) 2.5 mg weekly.  Current hypertension medications include: losartan /hydrochlorothiazide 50/12.5mg  daily, spironolactone  12.5 mg daily.  Current hyperlipidemia medications include: rosuvastatin  20 mg   Patient reports adherence to taking all medications as prescribed.    Do you feel that your medications are working for you? yes Have you been experiencing any side effects to the medications prescribed? Yes, with mild nausea during first 1-2 days following injection of Mounjaro  (tirzepatide ).  Do you have any problems obtaining medications due to transportation or finances? Yes - high deductible plan - unable to determine deductible remaining since last visit - reports trying three times to contact insurance.     Patient reports single hypoglycemic even   O:   Review of Systems  Cardiovascular:  Negative for leg swelling.  Neurological:  Negative for headaches.  All other systems reviewed and are negative.   Physical Exam Vitals reviewed.  Constitutional:      Appearance: Normal appearance.  Pulmonary:     Effort: Pulmonary effort is normal.  Neurological:     Mental Status: She is alert.  Psychiatric:        Mood and Affect: Mood normal.        Behavior: Behavior normal.        Thought Content: Thought content normal.        Judgment: Judgment normal.    Libre3 CGM  Download today 08/13/2023 % Time CGM is active: 96% Average Glucose: 157 mg/dL Glucose Management Indicator: 7.3  Glucose Variability: 19% (goal <36%) Time in Goal:  - Time in range 70-180: 70% - Time above range: 30% - Time below range: 0% Observed patterns: minimal variability, with rare readings > 250 and only 1 episode of low readings.  Lab Results  Component Value Date   HGBA1C 8.1 (A) 08/03/2023   Vitals:   08/13/23 1037  BP: 111/62  Pulse: 75  SpO2: 99%    Lipid Panel     Component Value Date/Time   CHOL 272 (H) 09/25/2022 1206   TRIG 204 (H) 09/25/2022 1206   HDL 32 (L) 09/25/2022 1206   CHOLHDL 8.5 (H) 09/25/2022 1206   CHOLHDL 5.5 09/06/2012 1027   VLDL 19 09/06/2012 1027   LDLCALC 200 (H) 09/25/2022 1206   LDLDIRECT 80 07/16/2023 1059    Clinical Atherosclerotic Cardiovascular Disease (ASCVD): No  The 10-year ASCVD risk score (Arnett DK, et al., 2019) is: 18.5%   Values used to calculate the score:     Age: 7 years     Clincally relevant sex: Female     Is Non-Hispanic African American: Yes     Diabetic: Yes     Tobacco smoker: No     Systolic Blood Pressure: 111 mmHg     Is BP treated: Yes     HDL Cholesterol: 32 mg/dL     Total Cholesterol: 272 mg/dL    A/P: Diabetes longstanding (2017) currently  stable control based on GMI of 7.3 which is improved from last A1c in July (8.1).  Medication adherence appears good. Control has improved with use of CGM to learn of impact of dietary habits, and with use of metformin . Patient reports mild nausea with use of Mounjaro  (tirzepatide ) - able to tolerate and asked to let us  know if this worsens or persists.   Patient unfortunately has been not been able to determine remaining deductible. - Continue metformin  1,000 mg BID.  - Continue GLP-1 Mounjaro  (tirzepatide ) at 2.5mg  weekly with use of sample  -Patient educated on purpose, proper use, and potential adverse effects.  -Extensively discussed pathophysiology of  diabetes, recommended lifestyle interventions, dietary effects on blood sugar control.   Requested refill on PRN cyclobenzaprine   Agreed to refill limited quantity at this time.    Written patient instructions provided. Patient verbalized understanding of treatment plan.  Total time in face to face counseling 18 minutes.    Follow-up:  Pharmacist 09/06/2023 (Final Liberate study planned early October) PCP clinic visit in 10/04/2023 Patient seen with Dr. Tharon, MD

## 2023-08-13 NOTE — Patient Instructions (Signed)
 It was nice to see you today!  You are doing great!  Your goal blood sugar is 80-130 before eating and less than 180 after eating.  Medication Changes: None  Continue all other medication the same.   Keep up the good work with diet and exercise. Aim for a diet full of vegetables, fruit and lean meats (chicken, [place], fish). Try to limit salt intake by eating fresh or frozen vegetables (instead of canned), rinse canned vegetables prior to cooking and do not add any additional salt to meals.

## 2023-08-13 NOTE — Assessment & Plan Note (Signed)
 Diabetes longstanding (2017) currently stable control based on GMI of 7.3 which is improved from last A1c in July (8.1).  Medication adherence appears good. Control has improved with use of CGM to learn of impact of dietary habits, and with use of metformin . Patient reports mild nausea with use of Mounjaro  (tirzepatide ) - able to tolerate and asked to let us  know if this worsens or persists.   Patient unfortunately has been not been able to determine remaining deductible. - Continue metformin  1,000 mg BID.  - Continue GLP-1 Mounjaro  (tirzepatide ) at 2.5mg  weekly with use of sample  -Patient educated on purpose, proper use, and potential adverse effects.  -Extensively discussed pathophysiology of diabetes, recommended lifestyle interventions, dietary effects on blood sugar control.

## 2023-08-13 NOTE — Progress Notes (Signed)
 Reviewed and agree with Dr Macky Lower plan.

## 2023-08-14 ENCOUNTER — Other Ambulatory Visit (HOSPITAL_COMMUNITY): Payer: Self-pay

## 2023-08-23 LAB — HM MAMMOGRAPHY

## 2023-08-27 ENCOUNTER — Encounter: Payer: Self-pay | Admitting: Family Medicine

## 2023-08-27 ENCOUNTER — Ambulatory Visit: Payer: Self-pay | Admitting: Family Medicine

## 2023-09-06 ENCOUNTER — Encounter: Payer: Self-pay | Admitting: Pharmacist

## 2023-09-06 ENCOUNTER — Ambulatory Visit (INDEPENDENT_AMBULATORY_CARE_PROVIDER_SITE_OTHER): Admitting: Pharmacist

## 2023-09-06 VITALS — BP 114/72 | HR 77 | Wt 267.8 lb

## 2023-09-06 DIAGNOSIS — E119 Type 2 diabetes mellitus without complications: Secondary | ICD-10-CM | POA: Diagnosis not present

## 2023-09-06 DIAGNOSIS — E7800 Pure hypercholesterolemia, unspecified: Secondary | ICD-10-CM | POA: Diagnosis not present

## 2023-09-06 MED ORDER — MOUNJARO 2.5 MG/0.5ML ~~LOC~~ SOAJ: 2.5000 mg | SUBCUTANEOUS | Status: DC

## 2023-09-06 MED ORDER — ROSUVASTATIN CALCIUM 40 MG PO TABS: 40.0000 mg | ORAL_TABLET | Freq: Every day | ORAL | 11 refills | Status: DC

## 2023-09-06 MED ORDER — EMPAGLIFLOZIN 25 MG PO TABS: 25.0000 mg | ORAL_TABLET | Freq: Every day | ORAL | Status: DC

## 2023-09-06 NOTE — Assessment & Plan Note (Signed)
 ASCVD risk - primary prevention in patient with diabetes. Last LDL is 80 not at goal of <70 mg/dL. ASCVD risk factors include diabetes, hypertension, Hyperlipidemia, and 10-year ASCVD risk score of 19.9. high intensity statin indicated.  -Increased dose of rosuvastatin  20 mg to 40 mg.

## 2023-09-06 NOTE — Patient Instructions (Addendum)
 It was nice to see you today! You're doing wonderfully, keep up the good work!  Your goal blood sugar is 80-130 before eating and less than 180 after eating.  Medication Changes: START taking Rosuvastatin  40 mg daily  Start taking Jardiance  (empagliflozin ) 25 mg daily  Continue all other medication the same.  Monitor blood sugars at home and keep a log (glucometer or piece of paper) to bring with you to your next visit.  Keep up the good work with diet and exercise. Aim for a diet full of vegetables, fruit and lean meats (chicken, [place], fish). Try to limit salt intake by eating fresh or frozen vegetables (instead of canned), rinse canned vegetables prior to cooking and do not add any additional salt to meals.

## 2023-09-06 NOTE — Progress Notes (Signed)
 Reviewed and agree with Dr Rennis plan.

## 2023-09-06 NOTE — Progress Notes (Signed)
 S:     Chief Complaint  Patient presents with   Medication Management    Dm follow-up    58 y.o. female who presents for diabetes evaluation, education, and management. Patient arrives in good spirits and presents without any assistance.   Patient was referred and last seen by Primary Care Provider, Dr. Delores, on 08/03/23.  At last visit, patient was continued on mounjaro  and metformin . She was told to check where her deductible was at between visits.   Patient reports Diabetes was diagnosed in 2017.  PMH is significant for diabetes, Hyperlipidemia, hypertension.  Current diabetes medications include: Mounjaro  (tirzepatide ) 2.5 mg Mondays, Metformin  XR 500 mg 2 tabs twice daily Current hypertension medications include: Losartan /hydrochlorothiazide 50-12.5 mg, Spironolactone  25 mg 0.5 tab daily Current hyperlipidemia medications include: rosuvastatin  20 mg daily  Patient reports adherence to taking all medications as prescribed.   Do you feel that your medications are working for you? Yes, Reports Mounjaro  may need to go up to get a better effect Have you been experiencing any side effects to the medications prescribed? Yes, diarrhea with with mounjaro  with negligible nausea initially  Patient denies hypoglycemic events.  Patient reports nocturia (nighttime urination).  Patient denies neuropathy (nerve pain). Patient denies visual changes. Patient reports self foot exams.   Patient reported dietary habits: Eats 2 meals/day. Lunch is main meal of day. Breakfast: piece a bacon, 1 boiled egg, half a piece of whole wheat toast Lunch: pre-prepped salad(caesar) or a malawi sandwiches Dinner: mainly protein and veggies Snacks: sugar-free jello, yogurt, fruits (apples oranges, plums, peaches, grapes, pineapples) Drinks: water , zero sugar sodas if she drinks them   Patient-reported exercise habits: walks a lot when at work. 5 days a week  O:   Review of Systems  All other  systems reviewed and are negative.   Physical Exam Constitutional:      Appearance: Normal appearance.  Pulmonary:     Effort: Pulmonary effort is normal.  Neurological:     Mental Status: She is alert.  Psychiatric:        Mood and Affect: Mood normal.        Behavior: Behavior normal.        Thought Content: Thought content normal.        Judgment: Judgment normal.    Libre3 CGM Download today 08/22/23-09/04/23 % Time CGM is active: 98% Average Glucose: 163 mg/dL Glucose Management Indicator: 7.2  Glucose Variability: 15.8% (goal <36%) Time in Goal:  - Time in range 70-180: 78% - Time above range: 22% - Time below range: 0%  Lab Results  Component Value Date   HGBA1C 8.1 (A) 08/03/2023   Vitals:   09/06/23 1033  BP: 114/72  Pulse: 77  SpO2: 94%    Lipid Panel     Component Value Date/Time   CHOL 272 (H) 09/25/2022 1206   TRIG 204 (H) 09/25/2022 1206   HDL 32 (L) 09/25/2022 1206   CHOLHDL 8.5 (H) 09/25/2022 1206   CHOLHDL 5.5 09/06/2012 1027   VLDL 19 09/06/2012 1027   LDLCALC 200 (H) 09/25/2022 1206   LDLDIRECT 80 07/16/2023 1059    Clinical Atherosclerotic Cardiovascular Disease (ASCVD): No  The 10-year ASCVD risk score (Arnett DK, et al., 2019) is: 19.9%   Values used to calculate the score:     Age: 60 years     Clincally relevant sex: Female     Is Non-Hispanic African American: Yes     Diabetic: Yes  Tobacco smoker: No     Systolic Blood Pressure: 114 mmHg     Is BP treated: Yes     HDL Cholesterol: 32 mg/dL     Total Cholesterol: 272 mg/dL    A/P: Diabetes longstanding since 2017 currently improving. Patient is able to verbalize appropriate hypoglycemia management plan. Medication adherence appears good. Control is suboptimal due to regimen. -Continued GLP-1 Mounjaro  (tirzepatide ) 2.5 mg weekly on Mondays with sample.  -Started SGLT2-I Jardiance  (empagliflozin ) 25 mg with sample. Counseled on sick day rules. -Continued metformin  XR 500 mg 2  tablets twice daily with meals.  -Patient educated on purpose, proper use, and potential adverse effects.  -Extensively discussed pathophysiology of diabetes, recommended lifestyle interventions, dietary effects on blood sugar control.  -Counseled on s/sx of and management of hypoglycemia.  -Next A1c anticipated ~7 if trend continues.   ASCVD risk - primary prevention in patient with diabetes. Last LDL is 80 not at goal of <70 mg/dL. ASCVD risk factors include diabetes, hypertension, Hyperlipidemia, and 10-year ASCVD risk score of 19.9. high intensity statin indicated.  -Increased dose of rosuvastatin  20 mg to 40 mg.   Written patient instructions provided. Patient verbalized understanding of treatment plan.  Total time in face to face counseling 24 minutes.    Follow-up:  Pharmacist 10/08/23 PCP clinic visit with Dr. Delores on 10/04/23 Patient seen with Fonda Blase, PharmD Candidate - PY3 student and Calton Nash, PharmD Candidate - PY4 student.

## 2023-09-06 NOTE — Assessment & Plan Note (Signed)
 Diabetes longstanding since 2017 currently improving. Patient is able to verbalize appropriate hypoglycemia management plan. Medication adherence appears good. Control is suboptimal due to regimen. -Continued GLP-1 Mounjaro  (tirzepatide ) 2.5 mg weekly on Mondays with sample.  -Started SGLT2-I Jardiance  (empagliflozin ) 25 mg with sample. Counseled on sick day rules. -Continued metformin  XR 500 mg 2 tablets twice daily with meals.  -Patient educated on purpose, proper use, and potential adverse effects.  -Extensively discussed pathophysiology of diabetes, recommended lifestyle interventions, dietary effects on blood sugar control.  -Counseled on s/sx of and management of hypoglycemia.  -Next A1c anticipated ~7 if trend continues.

## 2023-10-03 NOTE — Patient Instructions (Incomplete)
 It was wonderful to see you today.  Please bring ALL of your medications with you to every visit.   Today we talked about:  Follow up in 1 month for A1c----we will also order your CAT scan at this visit   We will check your cholesterol today  I sent Mounjaro  to your pharmacy at the 5mg  dose--this is a higher dose  Dr. Koval is here next week---we will see about samples then    Thank you for choosing [facility].   Please call [phone] with any questions about today's appointment.  Please be sure to schedule follow up at the front  desk before you leave today.   Suzann Daring, MD  Family Medicine

## 2023-10-03 NOTE — Progress Notes (Unsigned)
    SUBJECTIVE:   CHIEF COMPLAINT: check up HPI:   Alyssa Rangel is a 58 y.o.  with history notable for type 2 DM and HTN   presenting for follow up.   Patient reports overall she is doing well. A1C due in October. Tolerating Mounjaro  2.5 mg. She took her last dose on Monday. No adverse effects. Congratulated on weight loss   Otherwise she is doing well.   PERTINENT  PMH / PSH/Family/Social History : H pylori negative test of cure   OBJECTIVE:   BP 117/61   Pulse 80   Ht 5' 4 (1.626 m)   Wt 264 lb 12.8 oz (120.1 kg)   SpO2 96%   BMI 45.45 kg/m   Today's weight:  Last Weight  Most recent update: 10/04/2023 10:20 AM    Weight  120.1 kg (264 lb 12.8 oz)            Review of prior weights: [employer]   10/04/23 1020  Weight: 264 lb 12.8 oz (120.1 kg)   RRR Lungs clear   ASSESSMENT/PLAN:   Assessment & Plan Primary hypertension At goal, continue medications Type 2 diabetes mellitus without complication, without long-term current use of insulin  (HCC) Increase mounjaro  to 5 mg Sent test prescription to [facility] pharmacy for cost Follows up with Dr. Koval next week  Hypercholesterolemia Direct LDL today  Encounter for immunization Flu and COVID given today   Due for low dose CT in November---discussed at time of AVS   Suzann Daring, MD  Family Medicine Teaching Service  [facility]

## 2023-10-04 ENCOUNTER — Ambulatory Visit (INDEPENDENT_AMBULATORY_CARE_PROVIDER_SITE_OTHER): Admitting: Family Medicine

## 2023-10-04 ENCOUNTER — Encounter: Payer: Self-pay | Admitting: Family Medicine

## 2023-10-04 ENCOUNTER — Other Ambulatory Visit (HOSPITAL_COMMUNITY): Payer: Self-pay

## 2023-10-04 VITALS — BP 117/61 | HR 80 | Ht 64.0 in | Wt 264.8 lb

## 2023-10-04 DIAGNOSIS — I10 Essential (primary) hypertension: Secondary | ICD-10-CM

## 2023-10-04 DIAGNOSIS — Z23 Encounter for immunization: Secondary | ICD-10-CM | POA: Diagnosis not present

## 2023-10-04 DIAGNOSIS — E7800 Pure hypercholesterolemia, unspecified: Secondary | ICD-10-CM

## 2023-10-04 DIAGNOSIS — E119 Type 2 diabetes mellitus without complications: Secondary | ICD-10-CM

## 2023-10-04 MED ORDER — MOUNJARO 5 MG/0.5ML ~~LOC~~ SOAJ
5.0000 mg | SUBCUTANEOUS | 1 refills | Status: DC
  Filled 2023-10-04: qty 2, 28d supply, fill #0

## 2023-10-04 NOTE — Assessment & Plan Note (Signed)
 Increase mounjaro  to 5 mg Sent test prescription to [facility] pharmacy for cost Follows up with Dr. Koval next week

## 2023-10-04 NOTE — Assessment & Plan Note (Signed)
At goal, continue medications.

## 2023-10-04 NOTE — Assessment & Plan Note (Signed)
Direct LDL today  

## 2023-10-05 ENCOUNTER — Ambulatory Visit: Payer: Self-pay | Admitting: Family Medicine

## 2023-10-05 LAB — LDL CHOLESTEROL, DIRECT: LDL Direct: 131 mg/dL — ABNORMAL HIGH (ref 0–99)

## 2023-10-08 ENCOUNTER — Other Ambulatory Visit (HOSPITAL_COMMUNITY): Payer: Self-pay

## 2023-10-08 ENCOUNTER — Encounter: Payer: Self-pay | Admitting: Pharmacist

## 2023-10-08 ENCOUNTER — Ambulatory Visit (INDEPENDENT_AMBULATORY_CARE_PROVIDER_SITE_OTHER): Admitting: Pharmacist

## 2023-10-08 VITALS — BP 117/71 | HR 78 | Wt 261.4 lb

## 2023-10-08 DIAGNOSIS — E119 Type 2 diabetes mellitus without complications: Secondary | ICD-10-CM

## 2023-10-08 LAB — POCT GLYCOSYLATED HEMOGLOBIN (HGB A1C): HbA1c, POC (controlled diabetic range): 7.4 % — AB (ref 0.0–7.0)

## 2023-10-08 MED ORDER — FREESTYLE LIBRE 3 PLUS SENSOR MISC: 11 refills | Status: AC

## 2023-10-08 MED ORDER — MOUNJARO 2.5 MG/0.5ML ~~LOC~~ SOAJ: 2.5000 mg | SUBCUTANEOUS | Status: DC

## 2023-10-08 NOTE — Progress Notes (Signed)
 S:     Chief Complaint  Patient presents with   Medication Management    Liberate study visit final    58 y.o. female who presents for diabetes evaluation, education, and management. Patient arrives in good spirits and presents without any assistance.   Patient was referred and last seen by Primary Care Provider, Dr. Delores, on 10/04/23.  At last visit, Dr. Delores sent Mounjaro  (tirzepatide ) 5 mg to the pharmacy to see the price but patient reports costs was not affordable at $1000.   Patient reports Diabetes was diagnosed in 2017.  PMH is significant for diabetes, Hyperlipidemia, hypertension.  Current diabetes medications include: Jardiance  (empagliflozin ) 25 mg, Metformin  XR 500 mg take 2 tablets twice daily, Mounjaro  (tirzepatide ) 2.5 mg weekly Current hypertension medications include: Losartan /hydrochlorothiazide 50-12.5 mg, Spironolactone  25 mg 0.5 tablet daily Current hyperlipidemia medications include: Rosuvastatin  40 mg  Patient reports adherence to taking all medications as prescribed.  Has not been able to start on Mounjaro  (tirzepatide ) 5 mg as the cost was 1000+ dollars at pharmacy.  Do you feel that your medications are working for you? yes  Patient denies hypoglycemic events.  Reports leg cramps recently but doesn't believe it was from medications  Patient reports nocturia (nighttime urination). A lot of water  and on hydrochlorothiazide Patient denies neuropathy (nerve pain). Patient denies visual changes. Patient reports self foot exams.   Patient reported dietary habits: Eats 2 meals/day Breakfast: boiled egg and a piece of bacon, (cheated recently with grits and two pieces of toast and liver pudding ate throughout the day) Lunch: grilled chicken sandwich, salad, malawi sandwich, handful of baked chips. Little mayo, cheese, Lettuce, and tomato on sandwich. 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 Wishes to walk or go to the gym more but is exhausted after   O:   Review of Systems  All other systems reviewed and are negative.   Physical Exam Constitutional:      Appearance: Normal appearance.  Pulmonary:     Effort: Pulmonary effort is normal.  Neurological:     Mental Status: She is alert.  Psychiatric:        Mood and Affect: Mood normal.        Behavior: Behavior normal.        Thought Content: Thought content normal.        Judgment: Judgment normal.     Libre3 CGM Download today % Time CGM is active: 95% Average Glucose: 151 mg/dL Glucose Management Indicator: 6.9  Glucose Variability: 15.6% (goal <36%) Time in Goal:  - Time in range 70-180: 87% - Time above range: 13% - Time below range: 0% Observed patterns:  Lab Results  Component Value Date   HGBA1C 7.4 (A) 10/08/2023   Vitals:   10/08/23 1026  BP: 117/71  Pulse: 78  SpO2: 93%    Lipid Panel     Component Value Date/Time   CHOL 272 (H) 09/25/2022 1206   TRIG 204 (H) 09/25/2022 1206   HDL 32 (L) 09/25/2022 1206   CHOLHDL 8.5 (H) 09/25/2022 1206   CHOLHDL 5.5 09/06/2012 1027   VLDL 19 09/06/2012 1027   LDLCALC 200 (H) 09/25/2022 1206   LDLDIRECT 131 (H) 10/04/2023 1056  Clinical Atherosclerotic Cardiovascular Disease (ASCVD): No  The 10-year ASCVD risk score (Arnett DK, et al., 2019) is: 21.3%   Values used to calculate the score:     Age: 73 years     Clincally relevant sex: Female     Is Non-Hispanic African American: Yes     Diabetic: Yes     Tobacco smoker: No     Systolic Blood Pressure: 117 mmHg     Is BP treated: Yes     HDL Cholesterol: 32 mg/dL     Total Cholesterol: 272 mg/dL   A/P: Liberate study final visit Diabetes longstanding since  2017 currently improving with A1c of 7.4. Patient is able to verbalize appropriate hypoglycemia management plan. Medication adherence appears excellent. -Restarted GLP-1 Mounjaro  (tirzepatide ) 2.5 mg weekly with samples. Plan to try to again with insurance next year for titrating up on the medication.  -Continued SGLT2-I Jardiance  (empagliflozin ) 25 mg. Counseled on sick day rules. -Continued metformin  XR 500 mg 2 tablets by mouth twice daily.  -Sent Libre 3 plus sensors to pharmacy -Patient educated on purpose, proper use, and potential adverse effects. -Extensively discussed pathophysiology of diabetes, recommended lifestyle interventions, dietary effects on blood sugar control.  -Counseled on s/sx of and management of hypoglycemia.  -Next A1c anticipated January.   Written patient instructions provided. Patient verbalized understanding of treatment plan.  Total time in face to face counseling 27 minutes.    Follow-up:  PCP clinic visit 0n 11/05/23 Patient seen with Fonda Blase, PharmD Candidate - PY3 student and Estefana Blase, PharmD Candidate - PY4 student.

## 2023-10-08 NOTE — Patient Instructions (Signed)
 It was nice to see you today! Keep up the good work!  Your goal blood sugar is 80-130 before eating and less than 180 after eating.  Medication Changes: -RESTART Mounjaro  (tirzepatide ) 2.5 mg weekly with sample  Continue all other medication the same.  Keep up the good work with diet and exercise. Aim for a diet full of vegetables, fruit and lean meats (chicken, malawi, fish). Try to limit salt intake by eating fresh or frozen vegetables (instead of canned), rinse canned vegetables prior to cooking and do not add any additional salt to meals.

## 2023-10-08 NOTE — Assessment & Plan Note (Signed)
 Liberate study final visit Diabetes longstanding since 2017 currently improving with A1c of 7.4. Patient is able to verbalize appropriate hypoglycemia management plan. Medication adherence appears excellent. -Restarted GLP-1 Mounjaro  (tirzepatide ) 2.5 mg weekly with samples. Plan to try to again with insurance next year for titrating up on the medication.  -Continued SGLT2-I Jardiance  (empagliflozin ) 25 mg. Counseled on sick day rules. -Continued metformin  XR 500 mg 2 tablets by mouth twice daily.  -Sent Libre 3 plus sensors to pharmacy -Patient educated on purpose, proper use, and potential adverse effects. -Extensively discussed pathophysiology of diabetes, recommended lifestyle interventions, dietary effects on blood sugar control.  -Counseled on s/sx of and management of hypoglycemia.  -Next A1c anticipated January.

## 2023-10-08 NOTE — Progress Notes (Signed)
 Reviewed and agree with Dr Rennis plan.

## 2023-11-02 NOTE — Progress Notes (Signed)
 SUBJECTIVE:   CHIEF COMPLAINT: follow up HPI:   Alyssa Rangel is a 58 y.o.  with history notable for HTN, DM presenting for follow up.   Discussed the use of AI scribe software for clinical note transcription with the patient, who gave verbal consent to proceed.  History of Present Illness Alyssa Rangel is a 58 year old female who presents with a persistent cough following a recent illness.  Cough and respiratory symptoms - Persistent cough developed after receiving a flu vaccination - Cough is particularly bothersome at night and when lying down - Increased use of inhaler due to coughing and breathing difficulties - No fever - No chest pain  - No green nasal drainage or sinus pain - No dyspnea   Leg Cramps  - Leg swelling and cramps, especially at night - Increased frequency of symptoms over the past two weeks - Swelling attributed in part to prolonged standing at work and missing a few doses of diuretics  - Not wearing compression stockings recently - Had echo in spring for possible pulmonary HTN, referred to Pulmonary   Hypertension and medication adherence - Currently out of blood pressure medications, including losartan  hydrochlorothiazide and spironolactone , with plans to refill today - Difficulty splitting spironolactone  tablets as prescribed  Diabetes mellitus management - Diabetes is well-controlled with Mounjaro  and metformin  - No side effects   Hyperlipidemia - Takes rosuvastatin  40 mg daily for cholesterol management - Recent increase in cholesterol levels     PERTINENT  PMH / PSH/Family/Social History : HTN, prior tobacco use  OBJECTIVE:   BP 130/62   Pulse 83   Ht 5' 4 (1.626 m)   Wt 271 lb 3.2 oz (123 kg)   SpO2 95%   BMI 46.55 kg/m   Today's weight:  Last Weight  Most recent update: 11/05/2023 10:12 AM    Weight  123 kg (271 lb 3.2 oz)            Review of prior weights: Filed Weights   11/05/23 1012  Weight: 271  lb 3.2 oz (123 kg)    HEENT: EOMI. Sclera without injection or icterus. MMM. External auditory canal examined and WNL. TM normal appearance, no erythema or bulging. Neck: Supple. No LAD Sinuses without TTP  Cardiac: Regular rate and rhythm. Normal S1/S2. No murmurs, rubs, or gallops appreciated. Lungs: Clear bilaterally to ascultation.  Abdomen: Normoactive bowel sounds. No tenderness to deep or light palpation. No rebound or guarding.   Has 1-2+ pedal edema, pitting, similar to prior  Psych: Pleasant and appropriate     ASSESSMENT/PLAN:   Assessment & Plan Primary hypertension At goal on repeat Electrolytes as below for cramps  History of tobacco abuse CT lung screened ordered Type 2 diabetes mellitus without complication, without long-term current use of insulin  (HCC) A1c improved UACR at next visit (did not have to urinate today)  Hypercholesterolemia Refilled statin  LDL at next visit  Mild intermittent asthma without complication Refilled inhalers Had spirometry showing possible restriction Referred to Pulmonary previously Leg cramps Possibly due to RLS vs electrolytes Has chronic edema--has had prior echo (if above negative will ask to schedule with pulmonary given prior possible PAH)  Echo had inadequate TR for pulmonary pressures CT previously showed possible elevated pressures  Post-viral cough syndrome No signs or symptoms of pneumonia Discussed expected duration of cough No symptoms/signs sinusitis She will message if not improving Consider CXR if not improved    At next visit UACR  Sorina Derrig  Delores, MD  Family Medicine Teaching Service  [facility]

## 2023-11-05 ENCOUNTER — Ambulatory Visit: Admitting: Family Medicine

## 2023-11-05 ENCOUNTER — Encounter: Payer: Self-pay | Admitting: Family Medicine

## 2023-11-05 VITALS — BP 130/62 | HR 83 | Ht 64.0 in | Wt 271.2 lb

## 2023-11-05 DIAGNOSIS — E119 Type 2 diabetes mellitus without complications: Secondary | ICD-10-CM | POA: Diagnosis not present

## 2023-11-05 DIAGNOSIS — R252 Cramp and spasm: Secondary | ICD-10-CM

## 2023-11-05 DIAGNOSIS — I10 Essential (primary) hypertension: Secondary | ICD-10-CM

## 2023-11-05 DIAGNOSIS — E7800 Pure hypercholesterolemia, unspecified: Secondary | ICD-10-CM

## 2023-11-05 DIAGNOSIS — Z87891 Personal history of nicotine dependence: Secondary | ICD-10-CM | POA: Diagnosis not present

## 2023-11-05 DIAGNOSIS — R058 Other specified cough: Secondary | ICD-10-CM

## 2023-11-05 DIAGNOSIS — J4520 Mild intermittent asthma, uncomplicated: Secondary | ICD-10-CM

## 2023-11-05 MED ORDER — ROSUVASTATIN CALCIUM 40 MG PO TABS: 40.0000 mg | ORAL_TABLET | Freq: Every day | ORAL | 3 refills | Status: AC

## 2023-11-05 MED ORDER — METFORMIN HCL ER 500 MG PO TB24: 1000.0000 mg | ORAL_TABLET | Freq: Two times a day (BID) | ORAL | 3 refills | Status: AC

## 2023-11-05 MED ORDER — SPIRONOLACTONE 25 MG PO TABS: 12.5000 mg | ORAL_TABLET | Freq: Every day | ORAL | 11 refills | Status: AC

## 2023-11-05 MED ORDER — ALBUTEROL SULFATE HFA 108 (90 BASE) MCG/ACT IN AERS: 2.0000 | INHALATION_SPRAY | Freq: Four times a day (QID) | RESPIRATORY_TRACT | 3 refills | Status: AC | PRN

## 2023-11-05 NOTE — Assessment & Plan Note (Signed)
 Refilled statin  LDL at next visit

## 2023-11-05 NOTE — Assessment & Plan Note (Signed)
 A1c improved UACR at next visit (did not have to urinate today)

## 2023-11-05 NOTE — Patient Instructions (Signed)
 It was wonderful to see you today.  Please bring ALL of your medications with you to every visit.   Today we talked about:   We will check labs for your cramps  Please bring all of your medications to your next visit  I will message you with results  We will check your urine at the next visit  Please follow up in 3 months   Thank you for choosing [facility].   Please call [phone] with any questions about today's appointment.  Please be sure to schedule follow up at the front  desk before you leave today.   Suzann Daring, MD  Family Medicine

## 2023-11-05 NOTE — Assessment & Plan Note (Signed)
 At goal on repeat Electrolytes as below for cramps

## 2023-11-06 ENCOUNTER — Ambulatory Visit: Payer: Self-pay | Admitting: Family Medicine

## 2023-11-06 LAB — CBC
Hematocrit: 38.3 % (ref 34.0–46.6)
Hemoglobin: 12.4 g/dL (ref 11.1–15.9)
MCH: 27.1 pg (ref 26.6–33.0)
MCHC: 32.4 g/dL (ref 31.5–35.7)
MCV: 84 fL (ref 79–97)
Platelets: 295 x10E3/uL (ref 150–450)
RBC: 4.57 x10E6/uL (ref 3.77–5.28)
RDW: 12 % (ref 11.7–15.4)
WBC: 8 x10E3/uL (ref 3.4–10.8)

## 2023-11-06 LAB — BASIC METABOLIC PANEL WITH GFR
BUN/Creatinine Ratio: 18 (ref 12–28)
BUN: 13 mg/dL (ref 8–27)
CO2: 24 mmol/L (ref 20–29)
Calcium: 9.2 mg/dL (ref 8.7–10.3)
Chloride: 102 mmol/L (ref 96–106)
Creatinine, Ser: 0.74 mg/dL (ref 0.57–1.00)
Glucose: 184 mg/dL — ABNORMAL HIGH (ref 70–99)
Potassium: 4.4 mmol/L (ref 3.5–5.2)
Sodium: 138 mmol/L (ref 134–144)
eGFR: 93 mL/min/1.73 (ref >59)

## 2023-11-06 LAB — MAGNESIUM: Magnesium: 1.9 mg/dL (ref 1.6–2.3)

## 2023-11-06 LAB — FERRITIN: Ferritin: 116 ng/mL (ref 15–150)

## 2023-11-14 ENCOUNTER — Telehealth: Payer: Self-pay | Admitting: Family Medicine

## 2023-11-14 NOTE — Telephone Encounter (Signed)
-----   Message from [facility] Dese[facility] B sent at 11/13/2023  8:27 AM EST ----- Hey dr Merry Pond, I meant to ask you about this yesterday. Insurance is not cover her CT lung. Please advise. Casimir Griffins, CMA ----- Message ----- From: Mardelle Pastor, CMA Sent: 11/07/2023   9:26 AM EST To: Casimir JAYSON Griffins, CMA  Talked to [facility] member does not having imaging benefits, will need to be self pay. Pastor Mardelle CMA

## 2023-11-14 NOTE — Telephone Encounter (Signed)
 Called patient and discussed--no imaging benefit on insurance. She will check into this and get back to me  Suzann Daring, MD  [facility] Medicine Teaching Service

## 2023-12-27 LAB — HM MAMMOGRAPHY

## 2023-12-28 ENCOUNTER — Telehealth: Payer: Self-pay | Admitting: Pharmacist

## 2023-12-28 NOTE — Telephone Encounter (Signed)
 Patient contacted for follow-up of Liberate End of Study visit AND Libre 3 Sensor recall.   Since last contact patient reports she has been doing well.  She used her last sensor in mid November.   We scheduled an final Liberate study visit while on the phone.   Total time with patient call and documentation of interaction: 12 minutes.

## 2023-12-31 ENCOUNTER — Ambulatory Visit: Payer: Self-pay | Admitting: Family Medicine

## 2023-12-31 ENCOUNTER — Encounter: Payer: Self-pay | Admitting: Family Medicine

## 2023-12-31 NOTE — Telephone Encounter (Signed)
 Reviewed and agree with Dr Rennis plan.

## 2024-01-28 ENCOUNTER — Encounter: Payer: Self-pay | Admitting: Pharmacist

## 2024-01-28 ENCOUNTER — Ambulatory Visit: Admitting: Pharmacist

## 2024-01-28 VITALS — BP 118/60 | HR 64 | Wt 275.0 lb

## 2024-01-28 DIAGNOSIS — E119 Type 2 diabetes mellitus without complications: Secondary | ICD-10-CM | POA: Diagnosis not present

## 2024-01-28 DIAGNOSIS — E7800 Pure hypercholesterolemia, unspecified: Secondary | ICD-10-CM | POA: Diagnosis not present

## 2024-01-28 LAB — POCT GLYCOSYLATED HEMOGLOBIN (HGB A1C): HbA1c, POC (controlled diabetic range): 8.3 % — AB (ref 0.0–7.0)

## 2024-01-28 MED ORDER — EMPAGLIFLOZIN 25 MG PO TABS: 25.0000 mg | ORAL_TABLET | Freq: Every day | ORAL | Status: AC

## 2024-01-28 MED ORDER — MOUNJARO 2.5 MG/0.5ML ~~LOC~~ SOAJ: 2.5000 mg | SUBCUTANEOUS | 0 refills | Status: AC

## 2024-01-28 NOTE — Patient Instructions (Signed)
 It was nice to see you today!  Your goal glucose value is 80-130 before eating and less than 180 after eating.  Medication Changes: Re- START both Jardiance  (empagliflozin ) and Mounjaro  (tirzepatide )   Continue all other medication the same.  Keep up the good work with diet and exercise. Aim for a diet full of vegetables, fruit and lean meats (chicken, turkey, fish). Try to limit salt intake by eating fresh or frozen vegetables (instead of canned), rinse canned vegetables prior to cooking and do not add any additional salt to meals.   Monitor your glucose at home and keep a log (glucometer or piece of paper) to bring with you to your next visit.  Please bring all medications to your clinic visits.  Please arrive 10-15 minutes prior to your scheduled visit time.

## 2024-01-28 NOTE — Progress Notes (Signed)
 "   S:     Chief Complaint  Patient presents with   Medication Management    Diabetes follow-up    59 y.o. female who presents for diabetes evaluation, education, and management. Patient arrives in  good spirits and presents without any assistance.   Patient was referred and last seen by Primary Care Provider, Dr. Delores, on 11/05/2023.  At last visit with me 10/08/2023 was the last time patient had received multiple of her medications as samples. Denies taking Jardiance  (empagliflozin ) or Mounjaro  (tirzepatide ) since November 2025 ~ 2.5 months.    PMH is significant for diabetes, hyperlipidemia, hypertension. .  Patient reports Diabetes was diagnosed in 2017.   Current diabetes medications include: metformin  XR 1000 mg BID Current hypertension medications include: losartan -hydrochlorothiazide 50-12.5 mg daily, spironolactone  12.5 mg BID  Current hyperlipidemia medications include: rosuvastatin  40 mg daily  Patient reports running out of Mounjaro  (tirzepatide ) and Jardiance  (empagliflozin ), last dose of both was in early November. Reports she needs to pick up the rest of her pills from the pharmacy today. Patient reports adherence to taking all other medications as prescribed.   Do you feel that your medications are working for you? Yes, when she has them in her possession Have you been experiencing any side effects to the medications prescribed? denies Do you have any problems obtaining medications due to transportation or finances? yes Insurance coverage: Same as in the past - [employer] - multiplan  Patient denies hypoglycemic events. Reports sugars have been 130-140s.   Patient denies nocturia (nighttime urination).  Patient denies neuropathy (nerve pain).  Patient reported dietary habits: Eats 2 meals/day  O:   Review of Systems  All other systems reviewed and are negative.   Physical Exam Vitals reviewed.  Constitutional:      Appearance: Normal appearance.  Pulmonary:      Effort: Pulmonary effort is normal.  Neurological:     Mental Status: She is alert.     Libre3 CGM Download - no use for > 1 month - did not complete Liberate Study visit.  Lab Results  Component Value Date   HGBA1C 8.3 (A) 01/28/2024   Vitals:   01/28/24 1100  BP: 118/60  Pulse: 64  SpO2: 97%    Lipid Panel     Component Value Date/Time   CHOL 272 (H) 09/25/2022 1206   TRIG 204 (H) 09/25/2022 1206   HDL 32 (L) 09/25/2022 1206   CHOLHDL 8.5 (H) 09/25/2022 1206   CHOLHDL 5.5 09/06/2012 1027   VLDL 19 09/06/2012 1027   LDLCALC 200 (H) 09/25/2022 1206   LDLDIRECT 131 (H) 10/04/2023 1056    Clinical Atherosclerotic Cardiovascular Disease (ASCVD):  The 10-year ASCVD risk score (Arnett DK, et al., 2019) is: 21.8%   Values used to calculate the score:     Age: 73 years     Clinically relevant sex: Female     Is Non-Hispanic African American: Yes     Diabetic: Yes     Tobacco smoker: No     Systolic Blood Pressure: 118 mmHg     Is BP treated: Yes     HDL Cholesterol: 32 mg/dL     Total Cholesterol: 272 mg/dL  Lab Results  Component Value Date   CHOL 272 (H) 09/25/2022   HDL 32 (L) 09/25/2022   LDLCALC 200 (H) 09/25/2022   LDLDIRECT 131 (H) 10/04/2023   TRIG 204 (H) 09/25/2022   CHOLHDL 8.5 (H) 09/25/2022    Lab Results  Component Value Date  CREATININE 0.74 11/05/2023   BUN 13 11/05/2023   NA 138 11/05/2023   K 4.4 11/05/2023   CL 102 11/05/2023   CO2 24 11/05/2023    Medications Reviewed Today     Reviewed by Horace Wishon G, RPH-CPP (Pharmacist) on 01/28/24 at 1326  Med List Status: <None>   Medication Order Taking? Sig Documenting Provider Last Dose Status Informant  albuterol  (VENTOLIN  HFA) 108 (90 Base) MCG/ACT inhaler [id number] Yes Inhale 2 puffs into the lungs every 6 (six) hours as needed for wheezing or shortness of breath. Delores Suzann HERO, MD  Active   Cholecalciferol (VITAMIN D3) 1000 units CAPS [id number] Yes Take 1,000 Units by mouth daily.  [provider]  Active Self  clobetasol  ointment (TEMOVATE ) 0.05 % [id number]  Apply 1 Application topically 2 (two) times daily. Apply as directed twice daily, use for up to 2 weeks as needed.  Patient not taking: Reported on 01/28/2024   Delores Suzann HERO, MD  Active   clotrimazole  (CLOTRIMAZOLE  ANTI-FUNGAL) 1 % cream [id number] Yes Apply 1 Application topically 2 (two) times daily. Delores Suzann HERO, MD  Active   Continuous Glucose Sensor (FREESTYLE LIBRE 3 PLUS SENSOR) [place] [id number]  Change sensor every 15 days. McDiarmid, Krystal BIRCH, MD  Active   cyclobenzaprine  (FLEXERIL ) 10 MG tablet [id number] Yes Take 1 tablet (10 mg total) by mouth 3 (three) times daily as needed for muscle spasms.  Patient taking differently: Take 10 mg by mouth 3 (three) times daily.   McDiarmid, Krystal BIRCH, MD  Active   empagliflozin  (JARDIANCE ) 25 MG TABS tablet [id number]  Take 1 tablet (25 mg total) by mouth daily.  Patient not taking: Reported on 01/28/2024   McDiarmid, Krystal BIRCH, MD  Active   fexofenadine  (ALLEGRA  ALLERGY) 60 MG tablet [id number] Yes Take 1 tablet (60 mg total) by mouth 2 (two) times daily.  Patient taking differently: Take 60 mg by mouth daily as needed.   Delores Suzann HERO, MD  Active   losartan -hydrochlorothiazide (HYZAAR) 50-12.5 MG tablet [id number] Yes Take 1 tablet by mouth daily. McDiarmid, Krystal BIRCH, MD  Active   metFORMIN  (GLUCOPHAGE -XR) 500 MG 24 hr tablet [id number] Yes Take 2 tablets (1,000 mg total) by mouth 2 (two) times daily with a meal. Delores Suzann HERO, MD  Active   nystatin  (MYCOSTATIN /NYSTOP ) powder [id number] Yes Apply 1 Application topically 3 (three) times daily. Delores Suzann HERO, MD  Active   rosuvastatin  (CRESTOR ) 40 MG tablet [id number] Yes Take 1 tablet (40 mg total) by mouth daily. Delores Suzann HERO, MD  Active   spironolactone  (ALDACTONE ) 25 MG tablet [id number] Yes Take 0.5 tablets (12.5 mg total) by mouth daily. Delores Suzann HERO, MD  Active   tirzepatide  (MOUNJARO ) 2.5 MG/0.5ML Pen  [id number]  Inject 2.5 mg into the skin once a week.  Patient not taking: Reported on 01/28/2024   McDiarmid, Krystal BIRCH, MD  Active   vitamin C (ASCORBIC ACID) 500 MG tablet [id number] Yes Take 500 mg by mouth daily. [provider]  Active Self              A/P: Diabetes longstanding and currently worsened control due to multiple reasons including medication non-adherence and dietary indiscretion. Control today - A1C of 8.3 is nigher than target. Control is suboptimal due to inadequate medication therapy. - Restart GLP-1 Mounjaro  (tirzepatide ) at 2.5mg  weekly - Restart SGLT2-I Jardiance  (empagliflozin ) 25 mg daily. Counseled on sick day rules.  Both of the above with sample supply ~ 1 month.  -  Continued metformin  XR 1000mg  BID.  - Patient educated on purpose, proper use, and potential adverse effects.  - Extensively discussed pathophysiology of diabetes, recommended lifestyle interventions, dietary effects on glucose control.  - Counseled on s/sx of and management of hypoglycemia.  - Next A1c anticipated 3 months.   ASCVD risk - Primary prevention in patient with diabetes. Attempted to obtain lipid panel today.  Patient left without obtaining.   Hypertension longstanding and currently at goal with value of 118/60. Blood pressure goal of <130/80  mmHg. Medication adherence reported as good.  - Continue losartan  hydrochlorothiazide and spironolactone . - Patient left without obtaining UACR today.   Written patient instructions provided. Patient verbalized understanding of treatment plan.  Total time in face to face counseling 28 minutes.    Follow-up:  Pharmacist visit 1 month - to assist with medication access if patient does NOT yet have a change in insurance coverage. PCP clinic visit TBD - patient will call as it appears she will need to schedule a day off.  Patient seen with Megan McGill, PharmD Candidate - [employer] student.    "

## 2024-01-28 NOTE — Assessment & Plan Note (Signed)
 Diabetes longstanding and currently worsened control due to multiple reasons including medication non-adherence and dietary indiscretion. Control today - A1C of 8.3 is nigher than target. Control is suboptimal due to inadequate medication therapy. - Restart GLP-1 Mounjaro  (tirzepatide ) at 2.5mg  weekly - Restart SGLT2-I Jardiance  (empagliflozin ) 25 mg daily. Counseled on sick day rules. - Continued metformin  XR 1000mg  BID.  - Patient educated on purpose, proper use, and potential adverse effects.

## 2024-01-30 NOTE — Progress Notes (Signed)
 Reviewed and agree with Dr Rennis plan.

## 2024-02-25 ENCOUNTER — Ambulatory Visit: Admitting: Pharmacist
